# Patient Record
Sex: Female | Born: 1935 | Race: Black or African American | Hispanic: No | Smoking: Former smoker
Health system: Southern US, Community
[De-identification: ages and names within clinical notes are randomized; demographics above are authoritative.]

## PROBLEM LIST (undated history)

## (undated) DIAGNOSIS — N184 Chronic kidney disease, stage 4 (severe): Secondary | ICD-10-CM

## (undated) DIAGNOSIS — I10 Essential (primary) hypertension: Secondary | ICD-10-CM

## (undated) DIAGNOSIS — N390 Urinary tract infection, site not specified: Secondary | ICD-10-CM

## (undated) DIAGNOSIS — E785 Hyperlipidemia, unspecified: Secondary | ICD-10-CM

## (undated) DIAGNOSIS — I2510 Atherosclerotic heart disease of native coronary artery without angina pectoris: Secondary | ICD-10-CM

## (undated) DIAGNOSIS — R479 Unspecified speech disturbances: Secondary | ICD-10-CM

## (undated) DIAGNOSIS — R42 Dizziness and giddiness: Secondary | ICD-10-CM

## (undated) DIAGNOSIS — I639 Cerebral infarction, unspecified: Secondary | ICD-10-CM

## (undated) DIAGNOSIS — I428 Other cardiomyopathies: Secondary | ICD-10-CM

## (undated) DIAGNOSIS — I509 Heart failure, unspecified: Secondary | ICD-10-CM

## (undated) DIAGNOSIS — K922 Gastrointestinal hemorrhage, unspecified: Secondary | ICD-10-CM

## (undated) DIAGNOSIS — R202 Paresthesia of skin: Secondary | ICD-10-CM

## (undated) DIAGNOSIS — N289 Disorder of kidney and ureter, unspecified: Secondary | ICD-10-CM

## (undated) DIAGNOSIS — I495 Sick sinus syndrome: Secondary | ICD-10-CM

## (undated) HISTORY — DX: Chronic kidney disease, stage 4 (severe): N18.4

## (undated) HISTORY — DX: Atherosclerotic heart disease of native coronary artery without angina pectoris: I25.10

## (undated) HISTORY — DX: Hyperlipidemia, unspecified: E78.5

## (undated) HISTORY — DX: Sick sinus syndrome: I49.5

## (undated) HISTORY — PX: KNEE SURGERY: SHX244

## (undated) HISTORY — DX: Other cardiomyopathies: I42.8

## (undated) HISTORY — PX: ABDOMINAL SURGERY: SHX537

## (undated) HISTORY — DX: Unspecified speech disturbances: R47.9

---

## 1998-02-04 ENCOUNTER — Other Ambulatory Visit: Admission: RE | Admit: 1998-02-04 | Discharge: 1998-02-04 | Payer: Self-pay

## 1998-03-01 ENCOUNTER — Other Ambulatory Visit: Admission: RE | Admit: 1998-03-01 | Discharge: 1998-03-01 | Payer: Self-pay | Admitting: Family Medicine

## 1998-03-28 ENCOUNTER — Other Ambulatory Visit: Admission: RE | Admit: 1998-03-28 | Discharge: 1998-03-28 | Payer: Self-pay | Admitting: Family Medicine

## 1998-04-02 ENCOUNTER — Other Ambulatory Visit: Admission: RE | Admit: 1998-04-02 | Discharge: 1998-04-02 | Payer: Self-pay | Admitting: Family Medicine

## 1998-07-11 ENCOUNTER — Ambulatory Visit (HOSPITAL_COMMUNITY): Admission: RE | Admit: 1998-07-11 | Discharge: 1998-07-12 | Payer: Self-pay | Admitting: Ophthalmology

## 1998-12-10 ENCOUNTER — Ambulatory Visit (HOSPITAL_BASED_OUTPATIENT_CLINIC_OR_DEPARTMENT_OTHER): Admission: RE | Admit: 1998-12-10 | Discharge: 1998-12-10 | Payer: Self-pay | Admitting: Ophthalmology

## 1999-04-14 ENCOUNTER — Encounter: Payer: Self-pay | Admitting: Family Medicine

## 1999-04-14 ENCOUNTER — Ambulatory Visit (HOSPITAL_COMMUNITY): Admission: RE | Admit: 1999-04-14 | Discharge: 1999-04-14 | Payer: Self-pay | Admitting: Family Medicine

## 1999-08-28 ENCOUNTER — Other Ambulatory Visit: Admission: RE | Admit: 1999-08-28 | Discharge: 1999-08-28 | Payer: Self-pay | Admitting: Family Medicine

## 1999-11-17 ENCOUNTER — Ambulatory Visit (HOSPITAL_COMMUNITY): Admission: RE | Admit: 1999-11-17 | Discharge: 1999-11-17 | Payer: Self-pay | Admitting: Family Medicine

## 1999-11-17 ENCOUNTER — Encounter: Payer: Self-pay | Admitting: Family Medicine

## 2000-10-26 ENCOUNTER — Encounter: Payer: Self-pay | Admitting: Internal Medicine

## 2000-10-26 ENCOUNTER — Inpatient Hospital Stay (HOSPITAL_COMMUNITY): Admission: EM | Admit: 2000-10-26 | Discharge: 2000-10-27 | Payer: Self-pay | Admitting: Emergency Medicine

## 2001-05-23 ENCOUNTER — Ambulatory Visit (HOSPITAL_COMMUNITY): Admission: RE | Admit: 2001-05-23 | Discharge: 2001-05-23 | Payer: Self-pay | Admitting: Ophthalmology

## 2001-10-05 ENCOUNTER — Ambulatory Visit (HOSPITAL_COMMUNITY): Admission: RE | Admit: 2001-10-05 | Discharge: 2001-10-05 | Payer: Self-pay | Admitting: Family Medicine

## 2001-10-05 ENCOUNTER — Encounter: Payer: Self-pay | Admitting: Family Medicine

## 2002-10-13 ENCOUNTER — Encounter: Payer: Self-pay | Admitting: Family Medicine

## 2002-10-13 ENCOUNTER — Ambulatory Visit (HOSPITAL_COMMUNITY): Admission: RE | Admit: 2002-10-13 | Discharge: 2002-10-13 | Payer: Self-pay | Admitting: Family Medicine

## 2002-12-28 ENCOUNTER — Encounter: Payer: Self-pay | Admitting: Orthopedic Surgery

## 2003-01-02 ENCOUNTER — Inpatient Hospital Stay (HOSPITAL_COMMUNITY): Admission: RE | Admit: 2003-01-02 | Discharge: 2003-01-09 | Payer: Self-pay | Admitting: Orthopedic Surgery

## 2003-01-03 ENCOUNTER — Encounter: Payer: Self-pay | Admitting: Orthopedic Surgery

## 2003-01-05 ENCOUNTER — Encounter: Payer: Self-pay | Admitting: Orthopedic Surgery

## 2003-01-26 ENCOUNTER — Emergency Department (HOSPITAL_COMMUNITY): Admission: EM | Admit: 2003-01-26 | Discharge: 2003-01-26 | Payer: Self-pay | Admitting: Emergency Medicine

## 2003-02-20 ENCOUNTER — Encounter: Admission: RE | Admit: 2003-02-20 | Discharge: 2003-03-15 | Payer: Self-pay | Admitting: Orthopedic Surgery

## 2003-09-29 DIAGNOSIS — I2510 Atherosclerotic heart disease of native coronary artery without angina pectoris: Secondary | ICD-10-CM

## 2003-09-29 HISTORY — DX: Atherosclerotic heart disease of native coronary artery without angina pectoris: I25.10

## 2004-06-11 ENCOUNTER — Ambulatory Visit (HOSPITAL_COMMUNITY): Admission: RE | Admit: 2004-06-11 | Discharge: 2004-06-12 | Payer: Self-pay | Admitting: Cardiology

## 2004-06-11 HISTORY — PX: CARDIAC CATHETERIZATION: SHX172

## 2004-08-01 ENCOUNTER — Ambulatory Visit: Payer: Self-pay | Admitting: Family Medicine

## 2004-08-06 ENCOUNTER — Encounter: Admission: RE | Admit: 2004-08-06 | Discharge: 2004-08-06 | Payer: Self-pay | Admitting: Family Medicine

## 2004-08-08 ENCOUNTER — Ambulatory Visit: Payer: Self-pay | Admitting: Family Medicine

## 2004-09-08 ENCOUNTER — Ambulatory Visit: Payer: Self-pay | Admitting: Family Medicine

## 2004-11-18 ENCOUNTER — Ambulatory Visit: Payer: Self-pay | Admitting: Family Medicine

## 2004-12-23 ENCOUNTER — Ambulatory Visit: Payer: Self-pay | Admitting: Family Medicine

## 2004-12-26 ENCOUNTER — Encounter: Admission: RE | Admit: 2004-12-26 | Discharge: 2004-12-26 | Payer: Self-pay | Admitting: Family Medicine

## 2005-01-01 ENCOUNTER — Ambulatory Visit: Payer: Self-pay | Admitting: Sports Medicine

## 2005-01-15 ENCOUNTER — Ambulatory Visit (HOSPITAL_COMMUNITY): Admission: RE | Admit: 2005-01-15 | Discharge: 2005-01-15 | Payer: Self-pay | Admitting: Cardiology

## 2005-03-12 ENCOUNTER — Ambulatory Visit (HOSPITAL_COMMUNITY): Admission: RE | Admit: 2005-03-12 | Discharge: 2005-03-12 | Payer: Self-pay | Admitting: Gastroenterology

## 2005-03-19 ENCOUNTER — Ambulatory Visit: Payer: Self-pay | Admitting: Family Medicine

## 2005-03-24 ENCOUNTER — Encounter: Admission: RE | Admit: 2005-03-24 | Discharge: 2005-05-12 | Payer: Self-pay | Admitting: Orthopedic Surgery

## 2005-04-21 ENCOUNTER — Other Ambulatory Visit: Admission: RE | Admit: 2005-04-21 | Discharge: 2005-04-21 | Payer: Self-pay | Admitting: Family Medicine

## 2005-04-21 ENCOUNTER — Ambulatory Visit: Payer: Self-pay | Admitting: Family Medicine

## 2005-04-21 ENCOUNTER — Encounter: Payer: Self-pay | Admitting: Family Medicine

## 2005-05-01 ENCOUNTER — Encounter: Admission: RE | Admit: 2005-05-01 | Discharge: 2005-05-01 | Payer: Self-pay | Admitting: Family Medicine

## 2005-05-02 HISTORY — PX: OTHER SURGICAL HISTORY: SHX169

## 2005-05-03 ENCOUNTER — Encounter (INDEPENDENT_AMBULATORY_CARE_PROVIDER_SITE_OTHER): Payer: Self-pay | Admitting: *Deleted

## 2005-05-03 LAB — CONVERTED CEMR LAB

## 2005-05-19 ENCOUNTER — Ambulatory Visit: Payer: Self-pay | Admitting: Family Medicine

## 2005-06-11 ENCOUNTER — Ambulatory Visit: Payer: Self-pay | Admitting: Family Medicine

## 2005-09-15 ENCOUNTER — Ambulatory Visit: Payer: Self-pay | Admitting: Family Medicine

## 2005-10-27 ENCOUNTER — Encounter: Admission: RE | Admit: 2005-10-27 | Discharge: 2005-10-27 | Payer: Self-pay | Admitting: Vascular Surgery

## 2006-01-14 ENCOUNTER — Ambulatory Visit: Payer: Self-pay | Admitting: Family Medicine

## 2006-01-26 ENCOUNTER — Ambulatory Visit: Payer: Self-pay | Admitting: Family Medicine

## 2006-04-07 ENCOUNTER — Ambulatory Visit: Payer: Self-pay | Admitting: Family Medicine

## 2006-04-22 ENCOUNTER — Ambulatory Visit: Payer: Self-pay | Admitting: Family Medicine

## 2006-06-22 ENCOUNTER — Ambulatory Visit: Payer: Self-pay | Admitting: Family Medicine

## 2006-09-15 ENCOUNTER — Encounter (HOSPITAL_COMMUNITY): Admission: RE | Admit: 2006-09-15 | Discharge: 2006-12-14 | Payer: Self-pay | Admitting: Nephrology

## 2006-11-25 DIAGNOSIS — N184 Chronic kidney disease, stage 4 (severe): Secondary | ICD-10-CM

## 2006-11-25 DIAGNOSIS — M1990 Unspecified osteoarthritis, unspecified site: Secondary | ICD-10-CM

## 2006-11-25 DIAGNOSIS — I871 Compression of vein: Secondary | ICD-10-CM

## 2006-11-25 DIAGNOSIS — I10 Essential (primary) hypertension: Secondary | ICD-10-CM | POA: Insufficient documentation

## 2006-11-26 ENCOUNTER — Encounter (INDEPENDENT_AMBULATORY_CARE_PROVIDER_SITE_OTHER): Payer: Self-pay | Admitting: *Deleted

## 2006-11-30 ENCOUNTER — Encounter: Payer: Self-pay | Admitting: Family Medicine

## 2006-12-17 ENCOUNTER — Encounter: Payer: Self-pay | Admitting: Family Medicine

## 2006-12-23 ENCOUNTER — Encounter (HOSPITAL_COMMUNITY): Admission: RE | Admit: 2006-12-23 | Discharge: 2007-03-23 | Payer: Self-pay | Admitting: Nephrology

## 2007-01-03 ENCOUNTER — Encounter: Admission: RE | Admit: 2007-01-03 | Discharge: 2007-01-03 | Payer: Self-pay | Admitting: Nephrology

## 2007-01-13 ENCOUNTER — Ambulatory Visit: Payer: Self-pay | Admitting: Family Medicine

## 2007-01-27 ENCOUNTER — Telehealth: Payer: Self-pay | Admitting: *Deleted

## 2007-03-25 ENCOUNTER — Telehealth: Payer: Self-pay | Admitting: *Deleted

## 2007-07-04 ENCOUNTER — Ambulatory Visit: Payer: Self-pay | Admitting: Family Medicine

## 2007-07-04 LAB — CONVERTED CEMR LAB
Glucose, Urine, Semiquant: NEGATIVE
Ketones, urine, test strip: NEGATIVE
Specific Gravity, Urine: 1.01
pH: 6.5

## 2007-07-20 ENCOUNTER — Encounter: Payer: Self-pay | Admitting: Family Medicine

## 2007-08-15 ENCOUNTER — Telehealth: Payer: Self-pay | Admitting: Family Medicine

## 2007-10-20 ENCOUNTER — Encounter: Payer: Self-pay | Admitting: Family Medicine

## 2007-11-17 ENCOUNTER — Encounter: Payer: Self-pay | Admitting: Family Medicine

## 2008-02-24 ENCOUNTER — Encounter: Payer: Self-pay | Admitting: Family Medicine

## 2008-02-28 ENCOUNTER — Telehealth: Payer: Self-pay | Admitting: *Deleted

## 2008-03-01 ENCOUNTER — Ambulatory Visit: Payer: Self-pay | Admitting: Family Medicine

## 2008-04-17 ENCOUNTER — Ambulatory Visit: Payer: Self-pay | Admitting: Family Medicine

## 2008-04-17 ENCOUNTER — Inpatient Hospital Stay (HOSPITAL_COMMUNITY): Admission: AD | Admit: 2008-04-17 | Discharge: 2008-04-18 | Payer: Self-pay | Admitting: Family Medicine

## 2008-04-17 ENCOUNTER — Telehealth: Payer: Self-pay | Admitting: *Deleted

## 2008-04-17 ENCOUNTER — Encounter: Payer: Self-pay | Admitting: Family Medicine

## 2008-04-17 LAB — CONVERTED CEMR LAB: Blood Glucose, Fingerstick: 154

## 2008-04-27 ENCOUNTER — Encounter: Payer: Self-pay | Admitting: Family Medicine

## 2008-05-09 ENCOUNTER — Encounter: Payer: Self-pay | Admitting: Family Medicine

## 2008-05-09 ENCOUNTER — Ambulatory Visit: Payer: Self-pay | Admitting: Family Medicine

## 2008-05-11 ENCOUNTER — Ambulatory Visit: Payer: Self-pay | Admitting: Family Medicine

## 2008-05-14 ENCOUNTER — Encounter: Payer: Self-pay | Admitting: *Deleted

## 2008-05-14 ENCOUNTER — Ambulatory Visit: Payer: Self-pay | Admitting: Family Medicine

## 2008-05-14 DIAGNOSIS — R634 Abnormal weight loss: Secondary | ICD-10-CM

## 2008-05-14 LAB — CONVERTED CEMR LAB
ALT: 8 units/L (ref 0–35)
AST: 10 units/L (ref 0–37)
BUN: 32 mg/dL — ABNORMAL HIGH (ref 6–23)
Calcium: 9.8 mg/dL (ref 8.4–10.5)
Chloride: 105 meq/L (ref 96–112)
Creatinine, Ser: 3.18 mg/dL — ABNORMAL HIGH (ref 0.40–1.20)
HCT: 39.4 % (ref 36.0–46.0)
Platelets: 282 10*3/uL (ref 150–400)
Potassium: 4.2 meq/L (ref 3.5–5.3)
RBC: 4.63 M/uL (ref 3.87–5.11)
RDW: 14 % (ref 11.5–15.5)
Sed Rate: 59 mm/hr — ABNORMAL HIGH (ref 0–22)
Total Bilirubin: 0.4 mg/dL (ref 0.3–1.2)
Total Protein: 8.4 g/dL — ABNORMAL HIGH (ref 6.0–8.3)

## 2008-05-22 HISTORY — PX: CARDIOVASCULAR STRESS TEST: SHX262

## 2008-06-18 ENCOUNTER — Ambulatory Visit: Payer: Self-pay | Admitting: Family Medicine

## 2008-06-18 LAB — CONVERTED CEMR LAB

## 2008-07-04 ENCOUNTER — Encounter: Payer: Self-pay | Admitting: Family Medicine

## 2008-11-06 ENCOUNTER — Encounter: Payer: Self-pay | Admitting: Family Medicine

## 2008-12-12 ENCOUNTER — Telehealth: Payer: Self-pay | Admitting: Family Medicine

## 2008-12-20 ENCOUNTER — Ambulatory Visit: Payer: Self-pay | Admitting: Family Medicine

## 2008-12-20 DIAGNOSIS — R32 Unspecified urinary incontinence: Secondary | ICD-10-CM

## 2008-12-25 ENCOUNTER — Encounter: Admission: RE | Admit: 2008-12-25 | Discharge: 2008-12-25 | Payer: Self-pay | Admitting: Family Medicine

## 2009-01-31 ENCOUNTER — Ambulatory Visit: Payer: Self-pay | Admitting: Family Medicine

## 2009-01-31 LAB — CONVERTED CEMR LAB
Nitrite: NEGATIVE
Urobilinogen, UA: 0.2

## 2009-02-21 ENCOUNTER — Ambulatory Visit: Payer: Self-pay | Admitting: Family Medicine

## 2009-05-16 ENCOUNTER — Encounter: Payer: Self-pay | Admitting: Family Medicine

## 2009-08-01 ENCOUNTER — Ambulatory Visit: Payer: Self-pay | Admitting: Family Medicine

## 2009-11-11 ENCOUNTER — Ambulatory Visit: Payer: Self-pay | Admitting: Family Medicine

## 2009-11-11 DIAGNOSIS — M25519 Pain in unspecified shoulder: Secondary | ICD-10-CM

## 2009-12-03 ENCOUNTER — Encounter: Payer: Self-pay | Admitting: Family Medicine

## 2010-03-05 ENCOUNTER — Encounter: Payer: Self-pay | Admitting: Family Medicine

## 2010-05-26 ENCOUNTER — Ambulatory Visit: Payer: Self-pay | Admitting: Family Medicine

## 2010-05-28 ENCOUNTER — Encounter: Payer: Self-pay | Admitting: Family Medicine

## 2010-06-10 ENCOUNTER — Encounter: Payer: Self-pay | Admitting: *Deleted

## 2010-07-02 ENCOUNTER — Encounter: Payer: Self-pay | Admitting: Family Medicine

## 2010-07-07 ENCOUNTER — Encounter: Payer: Self-pay | Admitting: Family Medicine

## 2010-10-09 ENCOUNTER — Encounter: Payer: Self-pay | Admitting: Family Medicine

## 2010-10-19 ENCOUNTER — Encounter: Payer: Self-pay | Admitting: Cardiology

## 2010-10-28 NOTE — Consult Note (Signed)
[facility]   Imported By: Clydell Hakim 12/17/2009 14:23:52  _____________________________________________________________________  External Attachment:    Type:   Image     Comment:   External Document

## 2010-10-28 NOTE — Consult Note (Signed)
Summary: [facility]  [facility]   Imported By: De Nurse 07/02/2010 16:44:54  _____________________________________________________________________  External Attachment:    Type:   Image     Comment:   External Document

## 2010-10-28 NOTE — Miscellaneous (Signed)
  Clinical Lists Changes  Problems: Changed problem from RENAL INSUFFICIENCY, CHRONIC (ICD-586) to CHRONIC KIDNEY DISEASE STAGE IV (SEVERE) (ICD-585.4) 

## 2010-10-28 NOTE — Assessment & Plan Note (Signed)
Summary: f/u,df   Vital Signs:  Patient profile:   77 year old female Height:      60 inches Weight:      152.1 pounds BMI:     29.81 Temp:     97.9 degrees F oral Pulse rate:   84 / minute BP sitting:   137 / 69  (left arm) Cuff size:   regular  Vitals Entered By: Garen Grams LPN (May 26, 2010 3:53 PM) CC: f/u Is Patient Diabetic? No Pain Assessment Patient in pain? no        CC:  f/u.  History of Present Illness: Leg Edema complains of persistent severe leg edema.  No shortness of breath or chest pain or PND.  Not elevating (sits in rocker) taking lasix once daily not two times a day as ordered.  Compression stockings hurt too much.  Renal failure seeing Dr Melony Overly this week.    HYPERTENSION Disease Monitoring   Blood pressure range:does not check       Chest pain: N     Dyspnea:N Medications   Compliance: daily meds per mattingly   Lightheadedness: N     Edema:see above   ROS - as above PMH - Medications reviewed and updated in medication list.  Smoking Status noted in VS form     Habits & Providers  Alcohol-Tobacco-Diet     Tobacco Status: never  Current Medications (verified): 1)  Aspir-81 81 Mg Tbec (Aspirin) .... Take 1 Tablet By Mouth Once A Day 2)  Furosemide 80 Mg Tabs (Furosemide) .... 2 Each Am 3)  Toprol Xl 100 Mg Tb24 (Metoprolol Succinate) .... Take 1 Tablet By Mouth Once A Day 4)  Amlodipine Besylate 10 Mg  Tabs (Amlodipine Besylate) .... Once Daily 5)  Tramadol Hcl 50 Mg  Tabs (Tramadol Hcl) .Marland Kitchen.. 1 Two Times A Day Prn 6)  Oxybutynin Chloride 5 Mg Tabs (Oxybutynin Chloride) .... 1/2 Tablet Two Times A Day 7)  Sensipar 60 Mg Tabs (Cinacalcet Hcl) .Marland Kitchen.. 1 Daily Per Dr Briant Cedar 8)  Clonidine Hcl 0.1 Mg Tabs (Clonidine Hcl) .Marland Kitchen.. 1 By Mouth Two Times A Day  Allergies: No Known Drug Allergies  Social History: Smoking Status:  never  Physical Exam  General:  Well-developed,well-nourished,in no acute distress; alert,appropriate and  cooperative throughout examination Lungs:  Normal respiratory effort, chest expands symmetrically. Lungs are clear to auscultation, no crackles or wheezes. Heart:  Normal rate and regular rhythm. S1 and S2 normal without gallop, murmur, click, rub or other extra sounds. Extremities:  large amount of edema pitts to 2-3 mm below knee.  No skin breakdown   Impression & Recommendations:  Problem # 1:  EDEMA-LEGS,DUE TO VENOUS OBSTRUCT. (ICD-459.2) Assessment Deteriorated  most likely due to worsening renal failure and difficulty taking lasix and elevating.  Discussed approaches to these problems.   She will see Dr Briant Cedar soon for labs.   she still refuses dialysis   Orders: [facility]- Est Level  3 (16109)  Problem # 2:  HYPERTENSION, BENIGN SYSTEMIC (ICD-401.1) stable currenlty  Her updated medication list for this problem includes:    Furosemide 80 Mg Tabs (Furosemide) .Marland Kitchen... 2 each am    Toprol Xl 100 Mg Tb24 (Metoprolol succinate) .Marland Kitchen... Take 1 tablet by mouth once a day    Amlodipine Besylate 10 Mg Tabs (Amlodipine besylate) ..... Once daily    Clonidine Hcl 0.1 Mg Tabs (Clonidine hcl) .Marland Kitchen... 1 by mouth two times a day  Complete Medication List: 1)  Aspir-81 81 Mg  Tbec (Aspirin) .... Take 1 tablet by mouth once a day 2)  Furosemide 80 Mg Tabs (Furosemide) .... 2 each am 3)  Toprol Xl 100 Mg Tb24 (Metoprolol succinate) .... Take 1 tablet by mouth once a day 4)  Amlodipine Besylate 10 Mg Tabs (Amlodipine besylate) .... Once daily 5)  Tramadol Hcl 50 Mg Tabs (Tramadol hcl) .Marland Kitchen.. 1 two times a day prn 6)  Oxybutynin Chloride 5 Mg Tabs (Oxybutynin chloride) .... 1/2 tablet two times a day 7)  Sensipar 60 Mg Tabs (Cinacalcet hcl) .Marland Kitchen.. 1 daily per dr Briant Cedar 8)  Clonidine Hcl 0.1 Mg Tabs (Clonidine hcl) .Marland Kitchen.. 1 by mouth two times a day  Patient Instructions: 1)  Take all your medicines for Dr Briant Cedar to review 2)  Elevate your legs at least as high as your heart 3)  Take your furosemide  fluid pill twice a day in the early AM and middle of afternoon 4)  Get compression stockings and put them on before you get out of bed

## 2010-10-28 NOTE — Consult Note (Signed)
Summary: [facility]   [facility]   Imported By: Clydell Hakim 03/14/2010 15:31:23  _____________________________________________________________________  External Attachment:    Type:   Image     Comment:   External Document

## 2010-10-28 NOTE — Miscellaneous (Signed)
Summary: fl2 for in home care  Clinical Lists Changes form completed & to Dr. [place] to sign since pcp is out for 4 wks.Golden Circle RN  June 10, 2010 3:45 PM fl2 mailed back.Golden Circle RN  June 10, 2010 4:20 PM

## 2010-10-28 NOTE — Consult Note (Signed)
Summary: [facility]   [facility]   Imported By: Clydell Hakim 07/15/2010 15:36:25  _____________________________________________________________________  External Attachment:    Type:   Image     Comment:   External Document  Appended Document: [facility]     Clinical Lists Changes  Medications: Removed medication of AMLODIPINE BESYLATE 10 MG  TABS (AMLODIPINE BESYLATE) once daily

## 2010-10-28 NOTE — Assessment & Plan Note (Signed)
Summary: f/up,tcb   Vital Signs:  Patient profile:   77 year old female Height:      60 inches Weight:      156 pounds BMI:     30.58 Temp:     97.7 degrees F Pulse rate:   104 / minute BP sitting:   172 / 81  Vitals Entered By: Golden Circle RN (November 11, 2009 1:46 PM)  History of Present Illness: Left Shoulder Pain for last few weeks.  No trauma or weakness, worse with movement.  No change in sensation in left hand.  Has not tried pain medications.    HYPERTENSION Disease Monitoring   Blood pressure range:  Not checking     Chest pain: N     Dyspnea:N Medications   Compliance: did not take meds today worries makes her urinate   Lightheadedness: N     Edema:stable usual    ROS - as above PMH - Medications reviewed and updated in medication list.  Smoking Status noted in VS form      Current Medications (verified): 1)  Aspir-81 81 Mg Tbec (Aspirin) .... Take 1 Tablet By Mouth Once A Day 2)  Furosemide 80 Mg Tabs (Furosemide) .... 2 Each Am 3)  Toprol Xl 100 Mg Tb24 (Metoprolol Succinate) .... Take 1 Tablet By Mouth Once A Day 4)  Amlodipine Besylate 10 Mg  Tabs (Amlodipine Besylate) .... Once Daily 5)  Tramadol Hcl 50 Mg  Tabs (Tramadol Hcl) .Marland Kitchen.. 1 Two Times A Day Prn 6)  Oxybutynin Chloride 5 Mg Tabs (Oxybutynin Chloride) .... 1/2 Tablet Two Times A Day 7)  Zemplar 1 Mcg Caps (Paricalcitol) .Marland Kitchen.. 1 Daily Per Dr Briant Cedar (Renal)  Allergies: No Known Drug Allergies  Physical Exam  General:  Well-developed,well-nourished,in no acute distress; alert,appropriate and cooperative throughout examination Lungs:  Normal respiratory effort, chest expands symmetrically. Lungs are clear to auscultation, no crackles or wheezes. Heart:  Normal rate and regular rhythm. S1 and S2 normal without gallop, murmur, click, rub or other extra sounds. Msk:  Left shoulder has FROM except internal rotation which lacks about 20 degrees has pain with range of motion Distal strength and  pulses intact  No focal tenderness  Extremities:  1-2+ edema no lesions or ulcers   Impression & Recommendations:  Problem # 1:  SHOULDER PAIN (ICD-719.41)  most consistent with DJD will treat with her current analgesics and range of motion exercises.  If worsens consider xray and injection  Her updated medication list for this problem includes:    Aspir-81 81 Mg Tbec (Aspirin) .Marland Kitchen... Take 1 tablet by mouth once a day    Tramadol Hcl 50 Mg Tabs (Tramadol hcl) .Marland Kitchen... 1 two times a day prn  Orders: [facility]- Est  Level 4 (60630)  Problem # 2:  HYPERTENSION, BENIGN SYSTEMIC (ICD-401.1) Assessment: Deteriorated  Not well controlled but likely due to not taking medicciness.  Discussed which ones cause her to urinate and she should always take the others every day.  See what reading is in her nephrologist office in March visit  Her updated medication list for this problem includes:    Furosemide 80 Mg Tabs (Furosemide) .Marland Kitchen... 2 each am    Toprol Xl 100 Mg Tb24 (Metoprolol succinate) .Marland Kitchen... Take 1 tablet by mouth once a day    Amlodipine Besylate 10 Mg Tabs (Amlodipine besylate) ..... Once daily  Orders: [facility]- Est  Level 4 (16010)  Problem # 3:  WEIGHT LOSS (ICD-783.21) Continues very slow weight loss.  She assures me she had good access to food just is not hungry sometimes.  Likely due to renal insufficiency.  She does not want any screening tests   Complete Medication List: 1)  Aspir-81 81 Mg Tbec (Aspirin) .... Take 1 tablet by mouth once a day 2)  Furosemide 80 Mg Tabs (Furosemide) .... 2 each am 3)  Toprol Xl 100 Mg Tb24 (Metoprolol succinate) .... Take 1 tablet by mouth once a day 4)  Amlodipine Besylate 10 Mg Tabs (Amlodipine besylate) .... Once daily 5)  Tramadol Hcl 50 Mg Tabs (Tramadol hcl) .Marland Kitchen.. 1 two times a day prn 6)  Oxybutynin Chloride 5 Mg Tabs (Oxybutynin chloride) .... 1/2 tablet two times a day 7)  Zemplar 1 Mcg Caps (Paricalcitol) .Marland Kitchen.. 1 daily per dr Briant Cedar  (renal)  Patient Instructions: 1)  Please schedule a follow-up appointment in 6 months to check your weight.  Call if you lose your appetite 2)  Use Tylenol three times a day or Tramadol for your shoulder pain 3)  Do shoulder range of motion three ways every day 4)  Take your blood pressure medicine amlodipiine and Metropolol every day even if you go out.  They will not make you urinate more

## 2010-10-30 NOTE — Consult Note (Signed)
Summary: [facility]  [facility]   Imported By: De Nurse 10/21/2010 16:57:40  _____________________________________________________________________  External Attachment:    Type:   Image     Comment:   External Document

## 2010-12-25 ENCOUNTER — Other Ambulatory Visit: Payer: Self-pay | Admitting: Family Medicine

## 2010-12-25 DIAGNOSIS — R32 Unspecified urinary incontinence: Secondary | ICD-10-CM

## 2010-12-25 MED ORDER — OXYBUTYNIN CHLORIDE 5 MG PO TABS: 2.5000 mg | ORAL_TABLET | Freq: Two times a day (BID) | ORAL | Status: DC

## 2011-01-23 ENCOUNTER — Encounter: Payer: Self-pay | Admitting: Family Medicine

## 2011-01-27 ENCOUNTER — Other Ambulatory Visit: Payer: Self-pay | Admitting: Family Medicine

## 2011-01-27 MED ORDER — METOPROLOL SUCCINATE ER 100 MG PO TB24: 100.0000 mg | ORAL_TABLET | Freq: Every day | ORAL | Status: DC

## 2011-02-10 NOTE — Discharge Summary (Signed)
Frances Frances [name] Frances [name], Frances Frances [name] Frances [name]                 ACCOUNT NO.:  [id number]   MEDICAL RECORD NO.:  [id number]          Frances [name] TYPE:  INP   LOCATION:  3710                         FACILITY:  [facility]   PHYSICIAN:  Frances Frances [name] Frances [name], M.D.DATE OF BIRTH:  Feb 17, 1929   DATE OF ADMISSION:  04/17/2008  DATE OF DISCHARGE:  04/18/2008                               DISCHARGE SUMMARY   PRIMARY CARE PHYSICIAN:  Dr. Clelia [name] at [facility].   REASON FOR ADMISSION:  Falling x2.   DISCHARGE DIAGNOSES:  1. History of falls.  2. Chronic kidney disease.  3. Hypertension.  4. Lower extremity edema.  5. History of bilateral knee replacement.   DISCHARGE MEDICATIONS:  1. Aspirin 81 mg daily.  2. Furosemide 160 b.i.d.  3. Toprol XL 100 mg daily.  4. Amlodipine 10 mg daily.   PROCEDURES:  1. CT of Frances Frances [name] head without contrast on April 17, 2008.  Impression:      Atrophy and chronic microvascular ischemia in Frances Frances [name] white matter.  No      acute abnormality.  2. Chest x-ray on April 17, 2008.  Impression:  Cardiomegaly, improved      since prior study.  Probable scarring or pleural thickening      laterally at Frances Frances [name] left lung base, stable since prior study.  Stable      mild bronchitic change.  3. An echo on April 17, 2008.  Summary:  Mild anterior septal      hypokinesis.  Overall left ventricular systolic function was      normal.  Aortic valve thickness was mildly increased.  There was      mild to moderate aortic valvular regurgitation.  There was mild      mitral valvular regurgitation.   LABS UPON DISCHARGE:  White blood cell count 5.8, hemoglobin 10.9,  hematocrit 34.0, platelets 248,000.  Sodium 137, potassium 3.5, chloride  107, CO2 25, glucose 81, BUN 16, creatinine 2.29, calcium 9.1,  cholesterol 211, triglyceride 85, HDL cholesterol 86, LDL cholesterol  108.  Cardiac enzymes were negative x2.  TSH was 3.895.  Orthostatics  were normal.  Frances Frances [name] Frances [name] temperature was 97.6, pulse 80, respirations 18, blood  pressure 135/60, O2 sat 96% on room air.   HISTORY OF PRESENT ILLNESS:  Frances Frances [name] Frances [name] is a very pleasant 77 year old  African American female with known renal insufficiency who presented  with 2 episodes of falling at home.  she denied feeling dizzy,  lightheaded, or tripping prior to either fall.   Falls.  There were multiple etiologies to consider for this workup.  1. Heart.  Upon examination, Frances Frances [name] Frances [name] does have a systolic heart      murmur.  Frances Frances [name] Frances [name] echo results as above show mild increased aortic valve      thickness as well as mild to moderate aortic valve regurgitation      and mild mitral valve regurgitation.  This is unchanged from Frances Frances [name] Frances [name]      last echo in 2004.  Frances Frances [name] Frances [name] had a regular rate and rhythm      throughout Frances Frances [name] Frances [name] stay.  Frances Frances [name] Frances [name] cardiac enzymes  were negative x 2 and Frances Frances [name] Frances [name]      chest x-ray indicated that there was mild cardiomegaly but this was      decreased since Frances Frances [name] Frances [name] prior studies in 2005.  2. Frances Frances [name] Frances [name] has a history of CVAs per self-reported.  A CT that was      taken July 21 showed that she does have atrophy and chronic      microvascular ischemia in Frances Frances [name] Frances [name] white matter but there was no acute      abnormalities at this time.  3. Endocrine.  Frances Frances [name] Frances [name] does have a history of chronic kidney      disease that is stage 3.  She has been followed by nephrology, Dr.      Briant [name].  At this time she refuses dialysis.  Frances Frances [name] Frances [name] creatinine on      admission was 2.65 and upon discharge was 2.29.  At this time, she      was discharged with Frances Frances [name] Frances [name] home medicine of Lasix and still refuses      dialysis.  Frances Frances [name] Frances [name] TSH was within normal limits.  4. Infection.  Frances Frances [name] Frances [name] remained afebrile and without leukocytosis      throughout Frances Frances [name] Frances [name] stay.  There was no evidence of infection.  5. Debility.  Debility or deconditioning was considered Frances Frances [name] most      likely etiology of Frances Frances [name] Frances [name] falls.  Physical therapy did evaluate Ms.      Frances [name] and determined she was independent in all activities and      they signed off  immediately.  One thing that was not noted when Frances Frances [name]      Frances [name] was admitted was that she has a history of bilateral knee      replacements and that sometimes she feels that Frances Frances [name] Frances [name] legs do just      give out.  This is most likely Frances Frances [name] cause of Frances Frances [name] Frances [name] falls.  6. Drug related.  Frances Frances [name] Frances [name] admitted that she has not taken Frances Frances [name] Frances [name]      metoprolol or amlodipine in Frances Frances [name] last 24 hours and that she only      takes 160 mg of Lasix in Frances Frances [name] morning rather than b.i.d. because it      makes Frances Frances [name] Frances [name]  pee a lot.   DISCHARGE INSTRUCTIONS:  Frances Frances [name] Frances [name] was discharged with Frances Frances [name] Frances [name] home  medications and instructed to remember to take them.   FOLLOWUP APPOINTMENTS:  Frances Frances [name] Frances [name] wanted to make Frances Frances [name] Frances [name] own followup  appointment with Frances Frances [name] Frances [name] primary care physician.   DISCHARGE CONDITION:  Stable.      Frances Rima, MD  Electronically Signed      Frances Frances [name] Frances [name], M.D.  Electronically Signed    EW/MEDQ  D:  04/18/2008  T:  04/18/2008  Job:  [id number]   cc:   Frances Frances [name] Frances [name], M.D.

## 2011-02-13 NOTE — Op Note (Signed)
NAMESuraya, [name] [name] [name]                 ACCOUNT NO.:  [id number]   MEDICAL RECORD NO.:  [id number]          PATIENT TYPE:  AMB   LOCATION:  ENDO                         FACILITY:  [facility]   PHYSICIAN:  [name] [name]., M.D.DATE OF BIRTH:  11/05/1928   DATE OF PROCEDURE:  03/12/2005  DATE OF DISCHARGE:                                 OPERATIVE REPORT   PROCEDURE:  Colonoscopy.   MEDICATIONS:  The patient received a total of fentanyl 60 mcg and Versed 7  mg IV.   SCOPE:  Pediatric adjustable colonoscope.   INDICATION:  Heme-positive stool.   DESCRIPTION OF PROCEDURE:  The procedure explained to the patient and  consent obtained.  With the patient in left lateral decubitus position, the  Olympus pediatric adjustable colonoscope was inserted and advanced.  Prep  was excellent, cecum reached.  Ileocecal valve and appendiceal orifice seen.  The scope was withdrawn, and the cecum, ascending colon, transverse colon,  descending and sigmoid were seen well.  Pandiverticulosis throughout the  whole colon.  No polyps or other lesions.  Rectum free of polyps, internal  hemorrhoids seen in the rectum.  The scope was withdrawn.  The patient  tolerated the procedure well.   ASSESSMENT:  1.  Heme-positive stool with negative colonoscopy.  792.1.  2.  Pandiverticulosis.  562.10.   PLAN:  We will give a diverticulosis information sheet.  Have the patient  follow up in the office with Dr. Deirdre [name], her family physician.       JLE/MEDQ  D:  03/12/2005  T:  03/12/2005  Job:  [id number]   cc:   [name] [name], M.D.  Fax: [phone]
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
## 2011-02-13 NOTE — Op Note (Signed)
NAMELera, Gaines [name]                 ACCOUNT NO.:  [id number]   MEDICAL RECORD NO.:  [id number]          PATIENT TYPE:  AMB   LOCATION:  ENDO                         FACILITY:  [facility]   PHYSICIAN:  James L. Malon Kindle., M.D.DATE OF BIRTH:  1929-03-30   DATE OF PROCEDURE:  03/12/2005  DATE OF DISCHARGE:                                 OPERATIVE REPORT   PROCEDURE:  Esophagogastroduodenoscopy.   MEDICATIONS:  Fentanyl 60 mcg, Versed 6 mg IV.   SECOND ASSISTANT:  Olympus upper endoscope.   INDICATIONS FOR PROCEDURE:  Heme positive stool.   DESCRIPTION OF PROCEDURE:  The procedure explained to the patient and  consent obtained. With the patient in the left lateral decubitus position,  the Olympus upper endoscope was inserted and advanced. The stomach was  entered, pylorus identified and passed. The duodenum including the bulb and  second portion were seen well and were unremarkable. The scope withdrawn,  antrum, body, fundus of the stomach all seen well and were normal. There was  a large 3-4 cm hiatal hernia with a widely patent GE junction, several  erosions of the distal esophagus and a reddened distal esophagus. The  proximal esophagus was normal. The scope was withdrawn. The patient  tolerated the procedure well and was maintained on low flow oxygen and pulse  oximetry throughout the procedure.   ASSESSMENT:  1.  Heme positive stool, 792.1.  2.  Erosive esophagitis, 530.11.   PLAN:  Will give the patient generic Prilosec, a reflux sheet, proceed with  colonoscopy at this time.       JLE/MEDQ  D:  03/12/2005  T:  03/12/2005  Job:  [id number]   cc:   Pearlean Brownie, M.D.  Fax: [phone]

## 2011-02-13 NOTE — Discharge Summary (Signed)
Frances [name], Frances [name]                             ACCOUNT NO.:  [id number]   MEDICAL RECORD NO.:  [id number]                   PATIENT TYPE:  INP   LOCATION:  5038                                 FACILITY:  [facility]   PHYSICIAN:  Robert A. Thurston Hole, M.D.              DATE OF BIRTH:  09-10-1929   DATE OF ADMISSION:  01/02/2003  DATE OF DISCHARGE:  01/09/2003                                 DISCHARGE SUMMARY   ADMITTING DIAGNOSES:  1. End-stage degenerative joint disease, left knee.  2. History of cervical cancer.  3. History of a cerebrovascular accident.  4. Hypertension.  5. Congestive heart failure with chronic lower extremity edema.   DISCHARGE DIAGNOSES:  1. End-stage degenerative joint disease, left knee.  2. History of cervical cancer.  3. Hypertension.  4. Congestive heart failure with chronic lower extremity edema.  5. History of cerebrovascular accident.  6. Urinary tract infection.  7. Atelectasis.  8. Obesity.   HISTORY OF PRESENT ILLNESS:  The patient is a 77 year old female with a  history of bone-on-bone osteoarthritis of her right knee.  She has failed  conservative treatment including anti-inflammatories and cortisone  injections at this point in time.  She understands the risks, benefits and  possible complications of a left total knee and is without question.   PROCEDURES IN HOUSE:  The patient underwent a left total knee replacement by  Dr. Elana Alm. Wainer on January 02, 2003 and a femoral nerve block by  anesthesia on the same day.   HOSPITAL COURSE:  [facility] hospitalist was consulted to manage her multiple  medical problems.  Postop day 1, the patient was doing well, no complaints,  pulse was 120.  She was slightly tachycardic, no shortness of breath, no  chest pain.  O2 saturations were good at 97% on 2 L.  Surgical wound was  well-approximated.  Her hemoglobin was 9.6.  She was metabolically stable.  She was taking Percocet for pain, her PCA was discontinued,  her drain was  removed.  She was started on Lovenox 30 mg subcu twice a day.  She received  a 2-D echo.  Postop day 2, the patient continued to progress.  Her CPM was 0  to 40.  Surgical wound was well-approximated.  Her hemoglobin was 7.7.  Her  renal function was depressed due to postop blood loss anemia and  dehydration; she was given 2 units of blood with 10 mg of Lasix between  units.  Orders were written to transfer to 5000.  Postop day 3, the serum  creatinine was 2.6, BUN was 28, T-max was 101, hemoglobin was 9.4, UA showed  moderate leukocytes; she was started on Cipro 250 mg b.i.d. for three days  for her UTI.  Postop day 4, T-max of 100.8, blood pressure 150/59, surgical  wound well-approximated.  Postop day 6, T-max of 101, hemoglobin was 9.0,  INR was  2.1, surgical wound was well-approximated.  Discharge planning was  started.  Postop day 7, patient was doing well with no complaints.  She was  afebrile x24 hours.  Her surgical wound was well-approximated.  She was  discharged to home in stable condition, weightbearing as tolerated, on a  regular diabetic diet with:    DISCHARGE MEDICATIONS:  1. Percocet one to two every four to six hours p.r.n. pain,  2. Coumadin 4 mg one tablet daily.  3. Lasix 40 mg one tablet every morning.  4. Zocor 20 mg one tablet twice a day.  5. Colace 100 mg twice a day.  6. Senokot two tablets twice a day before a meal.   SPECIAL DISCHARGE INSTRUCTIONS:  She was instructed to call with  temperatures greater than 101, increased redness, increased drainage.  She  has been instructed to keep her wound clean and dry.   FOLLOWUP:  She will follow up with [place] on January 16, 2003.     Kirstin Shepperson, P.A.                  Robert A. Thurston Hole, M.D.    KS/MEDQ  D:  02/21/2003  T:  02/22/2003  Job:  [id number]

## 2011-02-13 NOTE — Op Note (Signed)
[place]  Patient:    SOWMYA, PARTRIDGE Visit Number: [id number] MRN: [id number]          Service Type: DSU Location: [facility] 2860 01 Attending Physician:  Karenann Cai Proc. Date: 05/24/01 Adm. Date:  05/23/2001 Disc. Date: 05/23/2001                             Operative Report  PREOPERATIVE DIAGNOSIS:  Opaque posterior capsule, left eye.  POSTOPERATIVE DIAGNOSIS:  Opaque posterior capsule, left eye.  OPERATION PERFORMED:  YAG laser capsulotomy.  SURGEON:  Marya Landry. Carlyle Lipa., M.D.  ANESTHESIA:  INDICATIONS FOR PROCEDURE:  The patient is a 77 year old lady who underwent cataract extraction of the left eye several months ago but now has a dense opaque posterior capsule of the left eye.  Her visual acuity ____________ 20/400 in that eye.  YAG laser capsulotomy was recommended and she is admitted at this time for that purposed.  DESCRIPTION OF PROCEDURE:  The patient was brought to the YAG laser room and positioned appropriately behind the laser.  Approximately 30 applications of laser energy of 2 millijoules were applied to the posterior capsule obtaining an opening of 3 to 4 mm.  The patient tolerated the procedure well and was discharged to the post anesthesia care unit in satisfactory condition.  She was instructed to see me in the office tomorrow morning for further evaluation.  DISCHARGE DIAGNOSES:  Opaque posterior capsule, left eye. Attending Physician:  Karenann Cai DD:  05/24/01 TD:  05/24/01 Job: [id number]

## 2011-02-13 NOTE — Cardiovascular Report (Signed)
NAMEAVALEEN, BROWNLEY                             ACCOUNT NO.:  [id number]   MEDICAL RECORD NO.:  [id number]                   PATIENT TYPE:  OIB   LOCATION:  2899                                 FACILITY:  [facility]   PHYSICIAN:  Madaline Savage, M.D.             DATE OF BIRTH:  1929/06/01   DATE OF PROCEDURE:  06/11/2004  DATE OF DISCHARGE:                              CARDIAC CATHETERIZATION   PROCEDURES PERFORMED:  1.  Retrograde left heart catheterization.  2.  Coronary angiography.   There was no left ventricular angiography performed during this procedure.   DYE USED:  Visipaque 40 mL.   COMPLICATIONS:  None.   PATIENT PROFILE:  Ms. Osso is a 77 year old African-American woman with a  longstanding history of hypertension who has had suboptimal control over the  years.  She was seen several months ago with complaints of left arm pain and  part of the workup for that arm pain included a Cardiolite stress test which  was performed December 25, 2003 showing a left ventricular ejection fraction of  44% and there was noted to be ischemia in the right coronary artery  territory.  The patient was therefore scheduled for this cardiac  catheterization today.  The test was completed with 40 mL of Visipaque  contrast with no complications.  The patient received Mucomyst and also got  a bicarbonate drip one hour before the procedure and will continue that one  for six hours following the procedure.   RESULTS:   PRESSURES:  Left ventricular pressure was 145/14, end-diastolic pressure 18.  Central aortic pressure 145/60, mean of 95.  No aortic valve gradient by  pullback technique.   ANGIOGRAPHIC RESULTS:  1.  The left main coronary artery was normal.  2.  The LAD coursed to the cardiac apex and was normal.  The first diagonal      branch off of the proximal LAD bifurcated distally and contained no      lesions.  3.  The circumflex was a large and dominant vessel giving rise to both   posterior lateral and posterior descending branch distally and first OM      and second OM branches were normal.   Right coronary angiography showed a small nondominant right with no  significant lesions seen.  There was no left ventricular angiogram performed  due to the patient's baseline creatinine of 2.5.   FINAL DIAGNOSES:  1.  Angiographically patent coronary arteries with a left dominant system.  2.  Normal left ventricular end-diastolic pressure.   PLAN:  The patient will be continued on antihypertensive medication.  She  will receive intravenous bicarbonate drip for another six hours and continue  on her doses of Mucomyst.  She will be seen in 48 hours for a basic  metabolic profile and will follow up in our office.  Madaline Savage, M.D.    WHG/MEDQ  D:  06/11/2004  T:  06/11/2004  Job:  [id number]   cc:   [facility]

## 2011-02-13 NOTE — Op Note (Signed)
NAMEALLIYAH, Frances [name]                             ACCOUNT NO.:  [id number]   MEDICAL RECORD NO.:  [id number]                   PATIENT TYPE:  INP   LOCATION:  3715                                 FACILITY:  [facility]   PHYSICIAN:  Robert A. Thurston Hole, M.D.              DATE OF BIRTH:  23-Mar-1929   DATE OF PROCEDURE:  01/02/2003  DATE OF DISCHARGE:                                 OPERATIVE REPORT   PREOPERATIVE DIAGNOSIS:  Left knee degenerative joint disease.   POSTOPERATIVE DIAGNOSIS:  Left knee degenerative joint disease.   PROCEDURES:  1. Left total knee replacement using [employer] Scorpio total knee system     with #7 cemented femoral component, #7 cemented tibial component with 12     mm polyethylene flexed tibial spacer, 26 mm polyethylene cemented     patella.  2. Left unilateral retinacular release.   SURGEON:  Elana Alm. Thurston Hole, M.D.   ASSISTANT:  Julien Girt, P.A.   ANESTHESIA:  Spinal.   OPERATIVE TIME:  One hour and 40 minutes.   COMPLICATIONS:  None.   DESCRIPTION OF PROCEDURE:  The patient was brought to the operating room on  January 02, 2003, placed on the operating room table in the supine position.  After adequate level of spinal anesthesia was obtained by Dr. Jacklynn Bue of  anesthesia, she had a Foley catheter placed under sterile conditions and  received Ancef 1 g IV preoperatively for prophylaxis.  The left knee was  examined under anesthesia with range of motion from -10 to 115 degrees, mild  varus deformity of knee.  Stable ligamentous exam with normal patellar  tracking.  Left leg was prepped using sterile Duraprep and draped using  sterile technique.  The leg was exsanguinated and a tourniquet elevated 375  mm.  Initially, through a 15-20 cm longitudinal incision based along the  patella, initial exposure was made.  The underlying subcutaneous tissues  were incised along with skin incision.  A median arthrotomy was performed  revealing excessive amount of  normal appearing joint fluid.  The articular  surfaces were inspected.  She had grade 4 changes throughout the knee in  medial/lateral and patellar femoral.  Osteophytes were removed from the  femoral condyles and tibial plateau.  Medial/lateral meniscal remnants were  removed as well as the anterior cruciate ligament.  The intermedullary drill  was then drilled up the femoral canal for placement of the distal femoral  cutting jig which was placed in the appropriate amount of rotation and a  distal 12 mm cut was made.  The distal femur was the sized.  A #7 was found  to be the appropriate size and a #7 cutting jig was placed and these cuts  were made.  The proximal tibia was then exposed.  The tibial spines were  removed with an oscillating saw.  Intermedullary drill drilled down the  tibial canal for placement  of the proximal tibial cutting jig which is  placed in the appropriate amount of rotation and a 6 mm proximal tibial cut  was made based off the medial or lower side.  After this was done, the  Scorpio PCL cutter was placed back on the distal femur and these cuts were  made.  At this point then the #7 femoral trial was placed with a #7 tibial  base plate trial was placed and with a 12 mm polyethylene spacer there was  found to be excellent restoration of normal alignment.  Range of motion was  0-120 degrees with excellent stability.  The tibial base plate was then  marked for rotation and the keel cut was made.  At this point, the patella  was size.  A 26 mm was found to be the appropriate size and a recessed 10 mm  x 26 mm cut was made and three locking holes were placed.  At this point, it  was felt that all the components were of excellent size, fit and stability  on the trial components and these were then removed.  The knee was then jet  lavaged irrigated with 3 L of saline solution.  The proximal tibia was then  exposed and the #7 tibial base plate with cement backing was  hammered into  position with an excellent fit with excess cement being removed from around  the edges.  In process of doing this, there was a partial detachment of the  patellar tendon distally and this was repaired with #2 Ethibond suture over  two small drill holes in the tibial tubercle.  Only 50% had been detached  and the other 50% remained intact.  This repair was felt to be solid.  After  the excess cement around the tibial base plate was removed, the femoral  component #7 with cement backing was hammered into position also with an  excellent fit with excess cement being removed from around the edges.  The  12 mm polyethylene flexed tibial spacer was locked on the tibial base plate.  The knee was taken through a range of motion with 0-120 degrees with  excellent stability.  The 26 mm cement backed patella was then placed in the  recess hole and locked into position with a clamp.  After cement hardened,  patella femoral tracking was evaluated.  There was still some significant  lateral tracking and thus lateral retinacular release was carried out  improving patellar tracking to normal.  After this was done, it was felt  that all the components were of excellent size, fit and stability.  The knee  was irrigated with antibiotic solution and then the arthrotomy was closed  with #1 Ethibond suture of two medium Hemovac drains.  Subcutaneous tissues  were closed with 0 Vicryl and 2-0 Vicryl.  The skin was closed with skin  staples.  Sterile dressings were applied.  Hemovac injected with 0.25%  Marcaine with epinephrine and clamped.  The tourniquet was released.  The  patient was then taking to the recovery room in stable condition.  Needle  and sponge counts correct x2 at the end of the case.                                               Robert A. Thurston Hole, M.D.    RAW/MEDQ  D:  01/02/2003  T:  01/03/2003  Job:  [id number]

## 2011-02-13 NOTE — Discharge Summary (Signed)
[place]  Patient:    Frances [name], Frances [name]                          MRN: [id number] Adm. Date:  [id number] Disc. Date: [id number] Attending:  Enrique Sack CC:         [facility]   Discharge Summary  PROBLEMS: 1. Hyperkalemia. 2. High blood pressure. 3. Renal insufficiency.  SUBJECTIVE:  This is a 77 year old patient who was admitted at 2 a.m. on October 26, 2000 by Dr. Erskine Speed as an unassigned patient to [facility] Emergency Room.  She goes to [facility] for her regular medical care.  The patient was admitted to the hospital with a potassium of 6.9, renal insufficiency, and a history of high blood pressure.  It was felt that her admitting problems were related to her medications which included Tenormin, Maxzide, Altace, and Vioxx.  HOSPITAL COURSE:  The patient was totally asymptomatic and was called at home by the laboratory after her critical potassium was found.  She then came to the emergency room and was evaluated and admitted.  Her medications was stopped and the patient was given gentle rehydration.  Within the first 12 hours the patients potassium improved.  She had a 4.6 second pause and her beta blocker was discontinued.  By 4 p.m. on October 26, 2000 BUN was down to 31, creatinine was down from 2 to 1.8, and potassium was down to 5.4.  The patient had also been given Kayexalate.  An ultrasound of the kidneys was performed showing no evidence of hydronephrosis or mass.  The patient continued to fell well throughout the entire hospitalization.  By October 27, 2000 the patient had no further pauses, although she had some brief slowing episodes, none were clinically significant.  Her CMET was entirely within normal limits with a sodium of 138, potassium 4.7, chloride 115, glucose 84, BUN 23, creatinine 1.5, normal liver function enzymes.  Without further delay the patient was then discharged on no medication of any type.  A  note was sent to [facility] with the patient that if perhaps she did have blood pressure elevation problems coming back then maybe a medicine like Norvasc which would not interfere with conduction or kidney function might be appropriate.  The patient was discharged in improved condition.  CONSULTATIONS:  None.  COMPLICATIONS:  None.  PROCEDURE:  None.  DISCHARGE MEDICATIONS:  None.  She could take some Tylenol if she wanted to for arthritis.  FOLLOW-UP:  She is to see the doctors of [facility] on Friday, February 1. DD:  10/27/00 TD:  10/27/00 Job: [id number]

## 2011-02-16 ENCOUNTER — Encounter: Payer: Self-pay | Admitting: Family Medicine

## 2011-03-04 ENCOUNTER — Other Ambulatory Visit: Payer: Self-pay | Admitting: Family Medicine

## 2011-03-04 MED ORDER — ASPIRIN 81 MG PO TABS: 81.0000 mg | ORAL_TABLET | Freq: Every day | ORAL | Status: DC

## 2011-03-06 ENCOUNTER — Encounter: Payer: Self-pay | Admitting: Family Medicine

## 2011-03-26 ENCOUNTER — Emergency Department (HOSPITAL_COMMUNITY): Payer: Medicare Other

## 2011-03-26 ENCOUNTER — Encounter: Payer: Self-pay | Admitting: Family Medicine

## 2011-03-26 ENCOUNTER — Inpatient Hospital Stay (HOSPITAL_COMMUNITY)
Admission: EM | Admit: 2011-03-26 | Discharge: 2011-03-31 | DRG: 871 | Disposition: A | Discharge status: Home Health | Payer: Medicare Other | Attending: Family Medicine | Admitting: Family Medicine

## 2011-03-26 DIAGNOSIS — K8020 Calculus of gallbladder without cholecystitis without obstruction: Secondary | ICD-10-CM | POA: Diagnosis present

## 2011-03-26 DIAGNOSIS — A419 Sepsis, unspecified organism: Secondary | ICD-10-CM | POA: Diagnosis present

## 2011-03-26 DIAGNOSIS — J9600 Acute respiratory failure, unspecified whether with hypoxia or hypercapnia: Secondary | ICD-10-CM | POA: Diagnosis present

## 2011-03-26 DIAGNOSIS — I5040 Unspecified combined systolic (congestive) and diastolic (congestive) heart failure: Secondary | ICD-10-CM | POA: Diagnosis present

## 2011-03-26 DIAGNOSIS — N184 Chronic kidney disease, stage 4 (severe): Secondary | ICD-10-CM | POA: Diagnosis present

## 2011-03-26 DIAGNOSIS — Z7982 Long term (current) use of aspirin: Secondary | ICD-10-CM

## 2011-03-26 DIAGNOSIS — I359 Nonrheumatic aortic valve disorder, unspecified: Secondary | ICD-10-CM | POA: Diagnosis present

## 2011-03-26 DIAGNOSIS — Z96659 Presence of unspecified artificial knee joint: Secondary | ICD-10-CM

## 2011-03-26 DIAGNOSIS — M1990 Unspecified osteoarthritis, unspecified site: Secondary | ICD-10-CM | POA: Diagnosis present

## 2011-03-26 DIAGNOSIS — R32 Unspecified urinary incontinence: Secondary | ICD-10-CM | POA: Diagnosis present

## 2011-03-26 DIAGNOSIS — R634 Abnormal weight loss: Secondary | ICD-10-CM | POA: Diagnosis present

## 2011-03-26 DIAGNOSIS — A4150 Gram-negative sepsis, unspecified: Principal | ICD-10-CM | POA: Diagnosis present

## 2011-03-26 DIAGNOSIS — K7200 Acute and subacute hepatic failure without coma: Secondary | ICD-10-CM | POA: Diagnosis not present

## 2011-03-26 DIAGNOSIS — D649 Anemia, unspecified: Secondary | ICD-10-CM | POA: Diagnosis present

## 2011-03-26 DIAGNOSIS — Z66 Do not resuscitate: Secondary | ICD-10-CM | POA: Diagnosis present

## 2011-03-26 DIAGNOSIS — I509 Heart failure, unspecified: Secondary | ICD-10-CM | POA: Diagnosis present

## 2011-03-26 DIAGNOSIS — J189 Pneumonia, unspecified organism: Secondary | ICD-10-CM | POA: Diagnosis present

## 2011-03-26 DIAGNOSIS — M25519 Pain in unspecified shoulder: Secondary | ICD-10-CM | POA: Diagnosis present

## 2011-03-26 DIAGNOSIS — Z8541 Personal history of malignant neoplasm of cervix uteri: Secondary | ICD-10-CM

## 2011-03-26 DIAGNOSIS — I129 Hypertensive chronic kidney disease with stage 1 through stage 4 chronic kidney disease, or unspecified chronic kidney disease: Secondary | ICD-10-CM | POA: Diagnosis present

## 2011-03-26 DIAGNOSIS — N179 Acute kidney failure, unspecified: Secondary | ICD-10-CM | POA: Diagnosis not present

## 2011-03-26 DIAGNOSIS — E876 Hypokalemia: Secondary | ICD-10-CM | POA: Diagnosis not present

## 2011-03-26 LAB — URINE MICROSCOPIC-ADD ON

## 2011-03-26 LAB — POCT I-STAT, CHEM 8
Creatinine, Ser: 3.5 mg/dL — ABNORMAL HIGH (ref 0.50–1.10)
HCT: 40 % (ref 36.0–46.0)
Hemoglobin: 13.6 g/dL (ref 12.0–15.0)
Potassium: 3.3 mEq/L — ABNORMAL LOW (ref 3.5–5.1)
Sodium: 141 mEq/L (ref 135–145)
TCO2: 21 mmol/L (ref 0–100)

## 2011-03-26 LAB — TROPONIN I: Troponin I: <0.3 ng/mL (ref <0.30)

## 2011-03-26 LAB — URINALYSIS, ROUTINE W REFLEX MICROSCOPIC
Bilirubin Urine: NEGATIVE
Glucose, UA: NEGATIVE mg/dL
Specific Gravity, Urine: 1.01 (ref 1.005–1.030)
pH: 7 (ref 5.0–8.0)

## 2011-03-26 LAB — DIFFERENTIAL
Eosinophils Absolute: 0 10*3/uL (ref 0.0–0.7)
Eosinophils Relative: 0 % (ref 0–5)
Lymphs Abs: 0.6 10*3/uL — ABNORMAL LOW (ref 0.7–4.0)
Monocytes Absolute: 0.6 10*3/uL (ref 0.1–1.0)
Monocytes Relative: 5 % (ref 3–12)

## 2011-03-26 LAB — CK TOTAL AND CKMB (NOT AT ARMC)
CK, MB: 1.5 ng/mL (ref 0.3–4.0)
Relative Index: INVALID (ref 0.0–2.5)
Total CK: 76 U/L (ref 7–177)
Total CK: 79 U/L (ref 7–177)

## 2011-03-26 LAB — PROTIME-INR: Prothrombin Time: 15.7 seconds — ABNORMAL HIGH (ref 11.6–15.2)

## 2011-03-26 LAB — PROCALCITONIN: Procalcitonin: 55.37 ng/mL

## 2011-03-26 LAB — LACTIC ACID, PLASMA: Lactic Acid, Venous: 4.3 mmol/L — ABNORMAL HIGH (ref 0.5–2.2)

## 2011-03-26 LAB — CBC
Hemoglobin: 11.8 g/dL — ABNORMAL LOW (ref 12.0–15.0)
RBC: 4.4 MIL/uL (ref 3.87–5.11)
WBC: 11.4 10*3/uL — ABNORMAL HIGH (ref 4.0–10.5)

## 2011-03-26 MED ORDER — TECHNETIUM TO 99M ALBUMIN AGGREGATED
6.0000 | Freq: Once | INTRAVENOUS | Status: AC | PRN
  Administered 2011-03-26: 6.2 via INTRAVENOUS

## 2011-03-26 MED ORDER — XENON XE 133 GAS
10.0000 | GAS_FOR_INHALATION | Freq: Once | RESPIRATORY_TRACT | Status: AC | PRN
  Administered 2011-03-26: 10 via RESPIRATORY_TRACT

## 2011-03-26 NOTE — H&P (Signed)
[facility] Admission History and Physical  Patient name: Frances [name] Medical record number: [id number] Date of birth: Oct 18, 1928 Age: 77 y.o. Gender: female  Primary Care Provider: Carney Living, MD, MD  Chief Complaint: Shortness of breath.  History of Present Illness: Frances [name] is a 77 y.o. year old female presenting with Shortness of breath and weakness. Per her home health worker she has been less active and having worsening shortness of breath for the past week. She had been refusing to go the hospital. However yesterday she SOB worsened and she developed nausea and weakness. Her home health worker convinced Frances [name] to come to [facility] ED for futher workup and management. Upon arrival she was tachycardic, hypotensive, and tachypnic. The blood pressure, and the majority of the tachycardia responded well to a fluid bolus. Her tachypnia responded some what to 3L O2 and she is satting well. She denies any fever, chills, abdominal pain, dysurea, continued nausea. She is hungry and thirsty and would like to eat. She feels pretty well currently. She continues to deny dialysis and does want DNR/I status.   Patient Active Problem List  Diagnoses  . HYPERTENSION, BENIGN SYSTEMIC  . EDEMA-LEGS,DUE TO VENOUS OBSTRUCT.  Marland Kitchen CHRONIC KIDNEY DISEASE STAGE IV (SEVERE)  . OSTEOARTHRITIS, LOWER LEG  . SHOULDER PAIN  . WEIGHT LOSS  . URINARY INCONTINENCE   Past Medical History: Past Medical History  Diagnosis Date  . CAD (coronary artery disease) 2005    Last cath in 2005 - no significant disease  . Normal echocardiogram 2009    mild findings EJ fx 55   2. Chronic kidney disease. 4  3. Hypertension.   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  Social History: Lives alone and has home health worker Frances [name] to help  her. Her number is [phone]. Frances [name] has one son in prison in [place]. No HCPOA.  Widowed.   Family History: No family history on file.  Allergies: Allergies not on file  Current Outpatient Prescriptions  Medication Sig Dispense Refill  . aspirin 81 MG tablet Take 1 tablet (81 mg total) by mouth daily.  90 tablet  3  . cinacalcet (SENSIPAR) 60 MG tablet Take 60 mg by mouth daily. per Dr Frances [name]       . cloNIDine (CATAPRES) 0.1 MG tablet Take 0.1 mg by mouth 2 (two) times daily.        . furosemide (LASIX) 80 MG tablet Take 160 mg by mouth every morning.        . metoprolol (TOPROL-XL) 100 MG 24 hr tablet Take 1 tablet (100 mg total) by mouth daily.  31 tablet  11  . oxybutynin (DITROPAN) 5 MG tablet Take 0.5 tablets (2.5 mg total) by mouth 2 (two) times daily.  30 tablet  3  . traMADol (ULTRAM) 50 MG tablet Take 50 mg by mouth 2 (two) times daily as needed.         Review Of Systems: Per HPI  Otherwise 12 point review of systems was performed and was unremarkable.  Physical Exam: Pulse: 110  Blood Pressure: 118/50 RR: 26   O2: 98 on 3L Temp: 97.7  General: alert and cooperative HEENT: PERRLA and No teeth. Tacky mucous membranes Heart: S1 and S2 normal, tachy normal rhythm. Systolic murmur radiating to the carrotids present.  Lungs: Tachypnea, no increased WOB. Crackles on right base. No wheeze noted.  Abdomen: abdomen is soft without significant tenderness, masses, organomegaly or guarding Extremities: extremities normal, atraumatic, no cyanosis or edema Skin:no rashes Neurology: normal without focal findings, mental status, speech normal, alert and oriented x3, PERLA and reflexes normal and symmetric  Labs and Imaging: Lab Results  Component Value Date/Time   NA 141 03/26/2011  2:22 PM   K 3.3* 03/26/2011  2:22 PM   CL 109 03/26/2011  2:22 PM   CO2 19 05/14/2008  8:55 PM   BUN 38* 03/26/2011  2:22 PM   CREATININE 3.50* 03/26/2011  2:22 PM   GLUCOSE 138* 03/26/2011  2:22 PM   Lab  Results  Component Value Date   WBC 11.4* 03/26/2011   HGB 13.6 03/26/2011   HCT 40.0 03/26/2011   MCV 83.6 03/26/2011   PLT 194 03/26/2011   Lab Results  Component Value Date   ALT 8 05/14/2008   AST 10 05/14/2008   ALKPHOS 183* 05/14/2008   BILITOT 0.4 05/14/2008   Lab Results  Component Value Date   CKTOTAL 76 03/26/2011   TROPONINI <0.30 03/26/2011   Procalcitonin 55, Lactate 4.3 BNP: 3417  CXR:  Mild vascular congestion and bibasilar atelectasis.  UA: Small LE, and 0-2 WBCs.   EKG: Rate 123 Sinus, ST depression  v5 and inverted T waves in V4-6. Changed from prior study.    Assessment and Plan: Frances [name] is a 77 y.o. year old female presenting with tachycardia, and tachypnea and hypotension 1. Tachycardia and Hypotension:  Both responded moderately to a small fluid bolus. Her EKG does show some T wave inversions and 1 lead with depression. However her cardiac enzymes are negative. My DDX includes: SIRS due to pneumonia, PE, MI.  Plan: Getting V/Q scan to eval for PE, treating with ceftriaxone and azithromycin for presumed PNA, and cycling CE for MI workup. Observing in [place] on monitor for arrythmias.  Will avoid too much fluid resuscitation to avoid volume overload. Holding lasix for now, as well as clonidine and amlodipine.  Will likely be restarting those medications in the morning.  2. ID: Suspect SIRS, however CXR is not positive for PNA or volume overload at this time. I think that her crackles on exam will likely become a PNA tomorrow when we repeat the CXR in the AM. Urine and BCX obtained. Will treat with Ceftriaxone and Azithromycin and follow in the morning. If another diagnosis becomes more apparent will d/c ABX. Will follow.  3. CKD IV: Has refused dialysis in the past and again refuses it today. Will follow creatine tomorrow. Will consult renal if needed.  4. HTN: Provided metoprolol at this time but holding clonidine patch and amlodipine. Will follow.  5.  FEN/GI: Passed bedside swallow study. Will allow a normal diet.  6 Prophylaxis: Heparin 7 Disposition: When PT/OT eval complete.   [id number]

## 2011-03-27 ENCOUNTER — Inpatient Hospital Stay (HOSPITAL_COMMUNITY): Payer: Medicare Other

## 2011-03-27 DIAGNOSIS — R0602 Shortness of breath: Secondary | ICD-10-CM

## 2011-03-27 DIAGNOSIS — I359 Nonrheumatic aortic valve disorder, unspecified: Secondary | ICD-10-CM

## 2011-03-27 DIAGNOSIS — A419 Sepsis, unspecified organism: Secondary | ICD-10-CM

## 2011-03-27 LAB — URINE CULTURE
Colony Count: NO GROWTH
Culture  Setup Time: 201206282158
Culture: NO GROWTH

## 2011-03-27 LAB — COMPREHENSIVE METABOLIC PANEL
ALT: 491 U/L — ABNORMAL HIGH (ref 0–35)
AST: 308 U/L — ABNORMAL HIGH (ref 0–37)
Albumin: 2.8 g/dL — ABNORMAL LOW (ref 3.5–5.2)
Calcium: 8.3 mg/dL — ABNORMAL LOW (ref 8.4–10.5)
Chloride: 100 mEq/L (ref 96–112)
Creatinine, Ser: 3.89 mg/dL — ABNORMAL HIGH (ref 0.50–1.10)
Sodium: 138 mEq/L (ref 135–145)

## 2011-03-27 LAB — CBC
Hemoglobin: 10.3 g/dL — ABNORMAL LOW (ref 12.0–15.0)
MCH: 27 pg (ref 26.0–34.0)
MCV: 82.7 fL (ref 78.0–100.0)
Platelets: 162 10*3/uL (ref 150–400)
RBC: 3.82 MIL/uL — ABNORMAL LOW (ref 3.87–5.11)
WBC: 18.4 10*3/uL — ABNORMAL HIGH (ref 4.0–10.5)

## 2011-03-27 LAB — CARDIAC PANEL(CRET KIN+CKTOT+MB+TROPI)
Relative Index: 3.5 — ABNORMAL HIGH (ref 0.0–2.5)
Total CK: 150 U/L (ref 7–177)
Troponin I: <0.3 ng/mL (ref <0.30)

## 2011-03-28 LAB — COMPREHENSIVE METABOLIC PANEL
ALT: 274 U/L — ABNORMAL HIGH (ref 0–35)
AST: 77 U/L — ABNORMAL HIGH (ref 0–37)
Alkaline Phosphatase: 221 U/L — ABNORMAL HIGH (ref 39–117)
CO2: 21 mEq/L (ref 19–32)
Chloride: 97 mEq/L (ref 96–112)
Creatinine, Ser: 4.81 mg/dL — ABNORMAL HIGH (ref 0.50–1.10)
GFR calc non Af Amer: 9 mL/min — ABNORMAL LOW (ref >60)
Total Bilirubin: 2.3 mg/dL — ABNORMAL HIGH (ref 0.3–1.2)

## 2011-03-28 LAB — CBC
MCV: 81 fL (ref 78.0–100.0)
Platelets: 142 10*3/uL — ABNORMAL LOW (ref 150–400)
RBC: 3.69 MIL/uL — ABNORMAL LOW (ref 3.87–5.11)
WBC: 12.7 10*3/uL — ABNORMAL HIGH (ref 4.0–10.5)

## 2011-03-28 LAB — CORTISOL-AM, BLOOD: Cortisol - AM: 22.6 ug/dL — ABNORMAL HIGH (ref 4.3–22.4)

## 2011-03-29 LAB — CULTURE, BLOOD (ROUTINE X 2): Culture  Setup Time: 201206282058

## 2011-03-29 LAB — CBC
HCT: 28.4 % — ABNORMAL LOW (ref 36.0–46.0)
MCH: 27 pg (ref 26.0–34.0)
MCHC: 33.8 g/dL (ref 30.0–36.0)
MCV: 79.8 fL (ref 78.0–100.0)
RDW: 13.6 % (ref 11.5–15.5)

## 2011-03-29 LAB — COMPREHENSIVE METABOLIC PANEL
Albumin: 2.2 g/dL — ABNORMAL LOW (ref 3.5–5.2)
BUN: 63 mg/dL — ABNORMAL HIGH (ref 6–23)
Calcium: 7.4 mg/dL — ABNORMAL LOW (ref 8.4–10.5)
Creatinine, Ser: 5.28 mg/dL — ABNORMAL HIGH (ref 0.50–1.10)
GFR calc Af Amer: 9 mL/min — ABNORMAL LOW (ref >60)
Total Protein: 6.8 g/dL (ref 6.0–8.3)

## 2011-03-30 LAB — COMPREHENSIVE METABOLIC PANEL
ALT: 115 U/L — ABNORMAL HIGH (ref 0–35)
AST: 17 U/L (ref 0–37)
Alkaline Phosphatase: 215 U/L — ABNORMAL HIGH (ref 39–117)
CO2: 19 mEq/L (ref 19–32)
GFR calc Af Amer: 10 mL/min — ABNORMAL LOW (ref >60)
GFR calc non Af Amer: 8 mL/min — ABNORMAL LOW (ref >60)
Glucose, Bld: 84 mg/dL (ref 70–99)
Potassium: 3.5 mEq/L (ref 3.5–5.1)
Sodium: 138 mEq/L (ref 135–145)
Total Protein: 7 g/dL (ref 6.0–8.3)

## 2011-03-31 LAB — CBC
HCT: 29.6 % — ABNORMAL LOW (ref 36.0–46.0)
MCH: 26.7 pg (ref 26.0–34.0)
MCHC: 34.1 g/dL (ref 30.0–36.0)
RDW: 14 % (ref 11.5–15.5)

## 2011-03-31 LAB — COMPREHENSIVE METABOLIC PANEL
Albumin: 2.3 g/dL — ABNORMAL LOW (ref 3.5–5.2)
Alkaline Phosphatase: 220 U/L — ABNORMAL HIGH (ref 39–117)
BUN: 60 mg/dL — ABNORMAL HIGH (ref 6–23)
Calcium: 7.5 mg/dL — ABNORMAL LOW (ref 8.4–10.5)
GFR calc Af Amer: 10 mL/min — ABNORMAL LOW (ref >60)
Glucose, Bld: 66 mg/dL — ABNORMAL LOW (ref 70–99)
Potassium: 3.2 mEq/L — ABNORMAL LOW (ref 3.5–5.1)
Total Protein: 7.2 g/dL (ref 6.0–8.3)

## 2011-04-02 ENCOUNTER — Emergency Department (HOSPITAL_COMMUNITY): Payer: Medicare Other

## 2011-04-02 ENCOUNTER — Inpatient Hospital Stay (HOSPITAL_COMMUNITY)
Admission: EM | Admit: 2011-04-02 | Discharge: 2011-04-06 | DRG: 640 | Disposition: A | Discharge status: Home Health | Payer: Medicare Other | Attending: Family Medicine | Admitting: Family Medicine

## 2011-04-02 ENCOUNTER — Encounter: Payer: Self-pay | Admitting: Emergency Medicine

## 2011-04-02 DIAGNOSIS — Z96659 Presence of unspecified artificial knee joint: Secondary | ICD-10-CM

## 2011-04-02 DIAGNOSIS — K8020 Calculus of gallbladder without cholecystitis without obstruction: Secondary | ICD-10-CM | POA: Diagnosis present

## 2011-04-02 DIAGNOSIS — I129 Hypertensive chronic kidney disease with stage 1 through stage 4 chronic kidney disease, or unspecified chronic kidney disease: Secondary | ICD-10-CM | POA: Diagnosis present

## 2011-04-02 DIAGNOSIS — A419 Sepsis, unspecified organism: Secondary | ICD-10-CM | POA: Diagnosis present

## 2011-04-02 DIAGNOSIS — E860 Dehydration: Principal | ICD-10-CM | POA: Diagnosis present

## 2011-04-02 DIAGNOSIS — N184 Chronic kidney disease, stage 4 (severe): Secondary | ICD-10-CM | POA: Diagnosis present

## 2011-04-02 DIAGNOSIS — Z8541 Personal history of malignant neoplasm of cervix uteri: Secondary | ICD-10-CM

## 2011-04-02 DIAGNOSIS — R471 Dysarthria and anarthria: Secondary | ICD-10-CM | POA: Diagnosis present

## 2011-04-02 DIAGNOSIS — A4159 Other Gram-negative sepsis: Secondary | ICD-10-CM | POA: Diagnosis present

## 2011-04-02 DIAGNOSIS — R627 Adult failure to thrive: Secondary | ICD-10-CM | POA: Diagnosis present

## 2011-04-02 DIAGNOSIS — I5032 Chronic diastolic (congestive) heart failure: Secondary | ICD-10-CM | POA: Diagnosis present

## 2011-04-02 DIAGNOSIS — R5381 Other malaise: Secondary | ICD-10-CM | POA: Diagnosis present

## 2011-04-02 DIAGNOSIS — E876 Hypokalemia: Secondary | ICD-10-CM | POA: Diagnosis not present

## 2011-04-02 DIAGNOSIS — J150 Pneumonia due to Klebsiella pneumoniae: Secondary | ICD-10-CM | POA: Diagnosis present

## 2011-04-02 DIAGNOSIS — Z7982 Long term (current) use of aspirin: Secondary | ICD-10-CM

## 2011-04-02 DIAGNOSIS — Z79899 Other long term (current) drug therapy: Secondary | ICD-10-CM

## 2011-04-02 DIAGNOSIS — Z66 Do not resuscitate: Secondary | ICD-10-CM | POA: Diagnosis present

## 2011-04-02 DIAGNOSIS — M1990 Unspecified osteoarthritis, unspecified site: Secondary | ICD-10-CM | POA: Diagnosis present

## 2011-04-02 LAB — COMPREHENSIVE METABOLIC PANEL
ALT: 69 U/L — ABNORMAL HIGH (ref 0–35)
AST: 39 U/L — ABNORMAL HIGH (ref 0–37)
Alkaline Phosphatase: 227 U/L — ABNORMAL HIGH (ref 39–117)
CO2: 20 mEq/L (ref 19–32)
Calcium: 7.5 mg/dL — ABNORMAL LOW (ref 8.4–10.5)
Chloride: 96 mEq/L (ref 96–112)
GFR calc Af Amer: 9 mL/min — ABNORMAL LOW (ref >60)
GFR calc non Af Amer: 7 mL/min — ABNORMAL LOW (ref >60)
Glucose, Bld: 87 mg/dL (ref 70–99)
Potassium: 4 mEq/L (ref 3.5–5.1)
Sodium: 135 mEq/L (ref 135–145)

## 2011-04-02 LAB — DIFFERENTIAL
Basophils Relative: 1 % (ref 0–1)
Eosinophils Relative: 1 % (ref 0–5)
Lymphocytes Relative: 16 % (ref 12–46)
Monocytes Absolute: 1.7 10*3/uL — ABNORMAL HIGH (ref 0.1–1.0)
Monocytes Relative: 14 % — ABNORMAL HIGH (ref 3–12)
Neutrophils Relative %: 68 % (ref 43–77)

## 2011-04-02 LAB — CBC
HCT: 31.8 % — ABNORMAL LOW (ref 36.0–46.0)
Hemoglobin: 10.5 g/dL — ABNORMAL LOW (ref 12.0–15.0)
RBC: 3.91 MIL/uL (ref 3.87–5.11)
WBC: 11.8 10*3/uL — ABNORMAL HIGH (ref 4.0–10.5)

## 2011-04-02 LAB — URINALYSIS, ROUTINE W REFLEX MICROSCOPIC
Bilirubin Urine: NEGATIVE
Glucose, UA: NEGATIVE mg/dL
Protein, ur: NEGATIVE mg/dL
Specific Gravity, Urine: 1.015 (ref 1.005–1.030)
Urobilinogen, UA: 0.2 mg/dL (ref 0.0–1.0)

## 2011-04-02 LAB — LACTIC ACID, PLASMA: Lactic Acid, Venous: 1 mmol/L (ref 0.5–2.2)

## 2011-04-02 LAB — URINE MICROSCOPIC-ADD ON

## 2011-04-02 LAB — PRO B NATRIURETIC PEPTIDE: Pro B Natriuretic peptide (BNP): 1540 pg/mL — ABNORMAL HIGH (ref 0–450)

## 2011-04-02 LAB — TROPONIN I: Troponin I: <0.3 ng/mL (ref <0.30)

## 2011-04-02 NOTE — H&P (Signed)
Frances [name] is an 77 y.o. female.   Chief Complaint: Not eating or drinking for the last 2 days HPI: Frances [name] is a 77 year old female with a history of hypertension, CKD stage IV refusing HD, and recent hospitalization for gram-negative rod sepsis and pneumonia presenting today with poor oral intake and dehydration.  Frances [name] states that the last time she ate was on Tuesday, although she is currently hungry.   She complains of some abdominal pain, nausea and diarrhea.  However denies chest pain, shortness of breath, and dysuria.  States that she has been taking her Avelox daily for her pneumonia/sepsis.  History is difficult to obtain due to dysarthria and poor historian.  Frances [name] has received a NS bolus in the ED.   Past Medical History  Diagnosis Date  . CAD (coronary artery disease) 2005    Last cath in 2005 - no significant disease  . Normal echocardiogram 2009    mild findings EJ fx 57   CKD stage IV, refusing HD Hypertension  Past surgical history 1. Knee replacement 2. ?total abdominal hysterectomy in 1975  Family History: Unknown  Social History: Lives alone with home health nurse Antionette who assists her (her number is [phone]).  does not have a smoking history on file. She does not have any smokeless tobacco history on file. Her alcohol and drug histories not on file.  Allergies: Allergies not on file  Medications prior to admission: amlodipine 10mg  1 tab daily Aspirin 81mg  i tab daily Calcitriol 0.25 mcg 1 capsule daily Clonidine pathch 0.2mg /24hrs, 1 patch transdermally weekly on Saturday Metoprolol XR 1 tab daily Oxybutynin 5mg  1/2 tab BID Sensipar 60mg  1 tab daily Furosemide 80mg  1 tab daily Avelox 400mg  1 tab daily   Results for orders placed during the hospital encounter of 04/02/11 (from the past 48 hour(s))  DIFFERENTIAL     Status: Abnormal   Collection Time   04/02/11  4:12 PM      Component Value Range Comment   Neutrophils Relative 68   43 - 77 (%)    Lymphocytes Relative 16  12 - 46 (%)    Monocytes Relative 14 (*) 3 - 12 (%)    Eosinophils Relative 1  0 - 5 (%)    Basophils Relative 1  0 - 1 (%)    Neutro Abs 8.0 (*) 1.7 - 7.7 (K/uL)    Lymphs Abs 1.9  0.7 - 4.0 (K/uL)    Monocytes Absolute 1.7 (*) 0.1 - 1.0 (K/uL)    Eosinophils Absolute 0.1  0.0 - 0.7 (K/uL)    Basophils Absolute 0.1  0.0 - 0.1 (K/uL)    WBC Morphology MILD LEFT SHIFT (1-5% METAS, OCC MYELO, OCC BANDS)   SLIGHT TOXIC GRANULATION AND FEW ATYPICAL LYMPHS NOTED   Smear Review LARGE PLATELETS PRESENT     CBC     Status: Abnormal   Collection Time   04/02/11  4:12 PM      Component Value Range Comment   WBC 11.8 (*) 4.0 - 10.5 (K/uL)    RBC 3.91  3.87 - 5.11 (MIL/uL)    Hemoglobin 10.5 (*) 12.0 - 15.0 (g/dL)    HCT 45.4 (*) 09.8 - 46.0 (%)    MCV 81.3  78.0 - 100.0 (fL)    MCH 26.9  26.0 - 34.0 (pg)    MCHC 33.0  30.0 - 36.0 (g/dL)    RDW 11.9  14.7 - 82.9 (%)    Platelets  304  150 - 400 (K/uL)   LACTIC ACID, PLASMA     Status: Normal   Collection Time   04/02/11  4:12 PM      Component Value Range Comment   Lactic Acid, Venous 1.0  0.5 - 2.2 (mmol/L)   COMPREHENSIVE METABOLIC PANEL     Status: Abnormal   Collection Time   04/02/11  4:12 PM      Component Value Range Comment   Sodium 135  135 - 145 (mEq/L)    Potassium 4.0  3.5 - 5.1 (mEq/L)    Chloride 96  96 - 112 (mEq/L)    CO2 20  19 - 32 (mEq/L)    Glucose, Bld 87  70 - 99 (mg/dL)    BUN 61 (*) 6 - 23 (mg/dL)    Creatinine, Ser 1.61 (*) 0.50 - 1.10 (mg/dL) **Please note change in reference range.**   Calcium 7.5 (*) 8.4 - 10.5 (mg/dL)    Total Protein 8.6 (*) 6.0 - 8.3 (g/dL)    Albumin 2.9 (*) 3.5 - 5.2 (g/dL)    AST 39 (*) 0 - 37 (U/L)    ALT 69 (*) 0 - 35 (U/L)    Alkaline Phosphatase 227 (*) 39 - 117 (U/L)    Total Bilirubin 0.5  0.3 - 1.2 (mg/dL)    GFR calc non Af Amer 7 (*) >60 (mL/min)    GFR calc Af Amer 9 (*) >60 (mL/min)   LIPASE, BLOOD     Status: Abnormal   Collection  Time   04/02/11  4:12 PM      Component Value Range Comment   Lipase 94 (*) 11 - 59 (U/L)   URINALYSIS, ROUTINE W REFLEX MICROSCOPIC     Status: Abnormal   Collection Time   04/02/11  5:24 PM      Component Value Range Comment   Color, Urine YELLOW  YELLOW     Appearance CLOUDY (*) CLEAR     Specific Gravity, Urine 1.015  1.005 - 1.030     pH 5.0  5.0 - 8.0     Glucose, UA NEGATIVE  NEGATIVE (mg/dL)    Hgb urine dipstick TRACE (*) NEGATIVE     Bilirubin Urine NEGATIVE  NEGATIVE     Ketones NEGATIVE  NEGATIVE (mg/dL)    Protein NEGATIVE  NEGATIVE (mg/dL)    Urobilinogen, UA 0.2  0.0 - 1.0 (mg/dL)    Nitrite NEGATIVE  NEGATIVE     Leukocytes, UA NEGATIVE  NEGATIVE    URINE MICROSCOPIC-ADD ON     Status: Abnormal   Collection Time   04/02/11  5:24 PM      Component Value Range Comment   Squamous Epithelial / LPF FEW (*) RARE     WBC, UA 0-2  <3 (WBC/hpf)    RBC / HPF 0-2  <3 (RBC/hpf)    Urine-Other AMORPHOUS URATES/PHOSPHATES   RARE YEAST  TROPONIN I     Status: Normal   Collection Time   04/02/11  6:14 PM      Component Value Range Comment   Troponin I <0.30  <0.30 (ng/mL)     ROS see HPI   Physical Exam  Vitals: T 97.5 BP 130/51 P 73 R 18 O2 sat 97% on room air Gen: elderly woman lying comfortably in bed; difficult to arouse and assess due to poor participation in exam HEENT: atraumatic, normocephalic; pupils are equal; sclera anicteric Neck: no JVP noted CV: regular rate and rhythm; no murmurs Lungs:  poor inspiratory effort; crackles in right lung base Abdomen: soft, +BS, appears mildly distended, mild diffuse tenderness on palpation Extremities: 2+ peripheral pulses; no leg edema Neuro: oriented x3; speech dysarthric; mood intermittently inappropriate; strength 3-4/5 and symmetric in all extremities  Assessment/Plan 77 yo woman with CKD stage IV refusing HD, hypertension and recent sepsis presenting with weakness, dehydration and failure to thrive.  1.  Dehydration/Failure to thrive: Unlikely to be severe dehydration due to no hemoconcentration, normal lactate and physical exam findings.  Cardiac markers are negative x1, making a cardiac event unlikely.  It is unclear why the patient has not been eating for the past two days, especially since she expresses hunger currently.  It appears that there is poor social support at home and deconditioning may be playing a role in her current state.  Plan is to provide maintenance IVFs with D5 1/2NS at 124mL/hr and start her on a clear diet, advance as tolerated.  Given the outcome of her discharge on July 3rd, we will start looking for placement.  2. Decrease mental status: Unknown baseline.  Will obtain a head CT.  Does have a slight leukocytosis with mild left shift, however no physical findings to indicate a locus of infection.  Is still taking Avelox for prior Klebsiella pneumonia/sepsis.  2.  Diastolic CHF: EF 81-19% on echo in 03/2011. Will hold lasix due to dehydration.  3. CKD stage IV: Creatinine has risen from 4.96 at discharge on 7/3 to 5.69 on admission.  Patient has refused hemodialysis and is being managed with medications.  Will continue calcitriol 0.45mcg daily and sensipar 60mg  daily.  4. Hypertension: This currently adequately controlled with her home medications.  Will continue clonidine patch 0.2mg /24hr and metoprolol XR 100mg  daily.  Will hold amlodipine due to risk of fluid overload with fluids.  5. Resolved Klebsiella bacteremia and pneumonia: will continue Avelox 400mg  daily for another 7 days for a total of 14 days.  6. FEN/GI: D5 1/2NS at 136ml/hr; clear liquid diet, advance as tolerated  7. Prophylaxis: SQ heparin  8. Code Status: DNR/DNI  9. Dispo: pending placement  BOOTH, ERIN 04/02/2011, 10:13 PM

## 2011-04-03 ENCOUNTER — Encounter: Payer: Self-pay | Admitting: Family Medicine

## 2011-04-03 ENCOUNTER — Other Ambulatory Visit (HOSPITAL_COMMUNITY): Payer: Medicare Other

## 2011-04-03 DIAGNOSIS — E860 Dehydration: Secondary | ICD-10-CM

## 2011-04-03 DIAGNOSIS — N179 Acute kidney failure, unspecified: Secondary | ICD-10-CM

## 2011-04-03 DIAGNOSIS — R109 Unspecified abdominal pain: Secondary | ICD-10-CM

## 2011-04-03 DIAGNOSIS — N189 Chronic kidney disease, unspecified: Secondary | ICD-10-CM

## 2011-04-03 LAB — CBC
Hemoglobin: 9.5 g/dL — ABNORMAL LOW (ref 12.0–15.0)
MCH: 26.7 pg (ref 26.0–34.0)
MCHC: 33 g/dL (ref 30.0–36.0)
MCV: 80.9 fL (ref 78.0–100.0)
RBC: 3.56 MIL/uL — ABNORMAL LOW (ref 3.87–5.11)

## 2011-04-03 LAB — BASIC METABOLIC PANEL
BUN: 55 mg/dL — ABNORMAL HIGH (ref 6–23)
CO2: 18 mEq/L — ABNORMAL LOW (ref 19–32)
Calcium: 6.7 mg/dL — ABNORMAL LOW (ref 8.4–10.5)
GFR calc non Af Amer: 9 mL/min — ABNORMAL LOW (ref >60)
Glucose, Bld: 99 mg/dL (ref 70–99)

## 2011-04-03 NOTE — H&P (Signed)
Frances [name] is an 77 y.o. female.   Chief Complaint: Decreased PO intake x2 days  HPI:   Briefly, this is a 77 yo female with PMH HTN, CKD Stage IV who was recently hospitalized for GNR sepsis and PNA.  Today she presents with poor PO intake x 2 days.  Patient's speech is slurred and she is difficult to understand which makes her a poor historian.  She says that slurred speech is not a new finding.  Patient states that the last time she ate or drank was Tuesday due to decreased appetite.  She also complains of nausea, diarrhea, and abdominal pain.  Denies any emesis, chest pain, SOB, or dysuria.  Denies fever , chills, diaphoresis.  She says she has been taking all of her medications, including the Avelox for PNA.  Currently, patient is hungry and asking for food.  In ED, she has had no episodes or nausea or diarrhea.      Past Medical History  Diagnosis Date  . CAD (coronary artery disease) 2005    Last cath in 2005 - no significant disease  . Normal echocardiogram 2009    mild findings EJ fx 57     No past surgical history on file.  No family history on file. Social History:  does not have a smoking history on file. She does not have any smokeless tobacco history on file. Her alcohol and drug histories not on file.  Allergies: Allergies not on file  No current facility-administered medications on file as of 04/03/2011.   Medications Prior to Admission  Medication Sig Dispense Refill  . aspirin 81 MG tablet Take 1 tablet (81 mg total) by mouth daily.  90 tablet  3  . cinacalcet (SENSIPAR) 60 MG tablet Take 60 mg by mouth daily. per Dr Briant Cedar       . cloNIDine (CATAPRES) 0.1 MG tablet Take 0.1 mg by mouth 2 (two) times daily.        . furosemide (LASIX) 80 MG tablet Take 160 mg by mouth every morning.        . metoprolol (TOPROL-XL) 100 MG 24 hr tablet Take 1 tablet (100 mg total) by mouth daily.  31 tablet  11  . oxybutynin (DITROPAN) 5 MG tablet Take 0.5 tablets (2.5 mg total) by  mouth 2 (two) times daily.  30 tablet  3  . traMADol (ULTRAM) 50 MG tablet Take 50 mg by mouth 2 (two) times daily as needed.          Results for orders placed during the hospital encounter of 04/02/11 (from the past 48 hour(s))  DIFFERENTIAL     Status: Abnormal   Collection Time   04/02/11  4:12 PM      Component Value Range Comment   Neutrophils Relative 68  43 - 77 (%)    Lymphocytes Relative 16  12 - 46 (%)    Monocytes Relative 14 (*) 3 - 12 (%)    Eosinophils Relative 1  0 - 5 (%)    Basophils Relative 1  0 - 1 (%)    Neutro Abs 8.0 (*) 1.7 - 7.7 (K/uL)    Lymphs Abs 1.9  0.7 - 4.0 (K/uL)    Monocytes Absolute 1.7 (*) 0.1 - 1.0 (K/uL)    Eosinophils Absolute 0.1  0.0 - 0.7 (K/uL)    Basophils Absolute 0.1  0.0 - 0.1 (K/uL)    WBC Morphology MILD LEFT SHIFT (1-5% METAS, OCC MYELO, OCC BANDS)  SLIGHT TOXIC GRANULATION AND FEW ATYPICAL LYMPHS NOTED   Smear Review LARGE PLATELETS PRESENT     CBC     Status: Abnormal   Collection Time   04/02/11  4:12 PM      Component Value Range Comment   WBC 11.8 (*) 4.0 - 10.5 (K/uL)    RBC 3.91  3.87 - 5.11 (MIL/uL)    Hemoglobin 10.5 (*) 12.0 - 15.0 (g/dL)    HCT 16.1 (*) 09.6 - 46.0 (%)    MCV 81.3  78.0 - 100.0 (fL)    MCH 26.9  26.0 - 34.0 (pg)    MCHC 33.0  30.0 - 36.0 (g/dL)    RDW 04.5  40.9 - 81.1 (%)    Platelets 304  150 - 400 (K/uL)   LACTIC ACID, PLASMA     Status: Normal   Collection Time   04/02/11  4:12 PM      Component Value Range Comment   Lactic Acid, Venous 1.0  0.5 - 2.2 (mmol/L)   COMPREHENSIVE METABOLIC PANEL     Status: Abnormal   Collection Time   04/02/11  4:12 PM      Component Value Range Comment   Sodium 135  135 - 145 (mEq/L)    Potassium 4.0  3.5 - 5.1 (mEq/L)    Chloride 96  96 - 112 (mEq/L)    CO2 20  19 - 32 (mEq/L)    Glucose, Bld 87  70 - 99 (mg/dL)    BUN 61 (*) 6 - 23 (mg/dL)    Creatinine, Ser 9.14 (*) 0.50 - 1.10 (mg/dL) **Please note change in reference range.**   Calcium 7.5 (*) 8.4 - 10.5  (mg/dL)    Total Protein 8.6 (*) 6.0 - 8.3 (g/dL)    Albumin 2.9 (*) 3.5 - 5.2 (g/dL)    AST 39 (*) 0 - 37 (U/L)    ALT 69 (*) 0 - 35 (U/L)    Alkaline Phosphatase 227 (*) 39 - 117 (U/L)    Total Bilirubin 0.5  0.3 - 1.2 (mg/dL)    GFR calc non Af Amer 7 (*) >60 (mL/min)    GFR calc Af Amer 9 (*) >60 (mL/min)   LIPASE, BLOOD     Status: Abnormal   Collection Time   04/02/11  4:12 PM      Component Value Range Comment   Lipase 94 (*) 11 - 59 (U/L)   URINALYSIS, ROUTINE W REFLEX MICROSCOPIC     Status: Abnormal   Collection Time   04/02/11  5:24 PM      Component Value Range Comment   Color, Urine YELLOW  YELLOW     Appearance CLOUDY (*) CLEAR     Specific Gravity, Urine 1.015  1.005 - 1.030     pH 5.0  5.0 - 8.0     Glucose, UA NEGATIVE  NEGATIVE (mg/dL)    Hgb urine dipstick TRACE (*) NEGATIVE     Bilirubin Urine NEGATIVE  NEGATIVE     Ketones NEGATIVE  NEGATIVE (mg/dL)    Protein NEGATIVE  NEGATIVE (mg/dL)    Urobilinogen, UA 0.2  0.0 - 1.0 (mg/dL)    Nitrite NEGATIVE  NEGATIVE     Leukocytes, UA NEGATIVE  NEGATIVE    URINE MICROSCOPIC-ADD ON     Status: Abnormal   Collection Time   04/02/11  5:24 PM      Component Value Range Comment   Squamous Epithelial / LPF FEW (*) RARE  WBC, UA 0-2  <3 (WBC/hpf)    RBC / HPF 0-2  <3 (RBC/hpf)    Urine-Other AMORPHOUS URATES/PHOSPHATES   RARE YEAST  TROPONIN I     Status: Normal   Collection Time   04/02/11  6:14 PM      Component Value Range Comment   Troponin I <0.30  <0.30 (ng/mL)   PRO B NATRIURETIC PEPTIDE     Status: Abnormal   Collection Time   04/02/11 11:00 PM      Component Value Range Comment   BNP, POC 1540.0 (*) 0 - 450 (pg/mL)   CK TOTAL AND CKMB     Status: Normal   Collection Time   04/02/11 11:00 PM      Component Value Range Comment   Total CK 108  7 - 177 (U/L)    CK, MB 2.4  0.3 - 4.0 (ng/mL)    Relative Index 2.2  0.0 - 2.5    TROPONIN I     Status: Normal   Collection Time   04/02/11 11:00 PM      Component  Value Range Comment   Troponin I <0.30  <0.30 (ng/mL)     ROS  Per HPI  Vitals: T 97.5, BP 130/51, RR 18, 97% on RA  Physical Exam  Constitutional: She appears well-developed and well-nourished. No distress.       Drowsy, but arousable  HENT:  Head: Normocephalic and atraumatic.  Eyes: EOM are normal. Pupils are equal, round, and reactive to light.  Neck: Normal range of motion. Neck supple.  Cardiovascular: Normal rate, regular rhythm and normal heart sounds.  Exam reveals no gallop and no friction rub.   No murmur heard. Respiratory: Effort normal. No respiratory distress. She has rales. She exhibits no tenderness.       Crackles appreciated R lower lung base > L, poor inspiratory effort  GI: Soft. Bowel sounds are normal. She exhibits no distension. There is no tenderness.  Musculoskeletal:       2+ peripheral pulses, no cyanosis/clubbing, trace pedal non-pitting edema  Neurological:       Drowsy, but oriented to place and time; slurred speech with asymmetric smile (possible L facial droop); strength 3/5 bil upper and lower extremities  Psychiatric:       laughs inappropriately      Assessment/Plan 77 yo female with stage IV CKD, HTN, and recent hospitalization for sepsis/PNA presents with decrease PO intake and dehydration 1) Dehydration: likely multi-factorial.  May be secondary to viral gastroenteritis vs. Adverse effect to antibiotics vs. Inability to care for herself at home.  Will start IVF at 125 cc/hr.  Will hold Lasix home dose for now given acute setting of decreased PO intake and elevated Creatinine.  Will consult PT/OT for deconditioning.  2) Altered mental status: baseline mentation difficult to assess without patient's caregiver present.  Per patient, she has had a CVA in past, but no documented records of this.   Will get a head CT to rule out any acute bleeds.  Will speak to PCP and primary team regarding patient's baseline mentation.  3) Diastolic CHF: EF  40-45% from last admission.  Does not appear to be fluid overloaded in any acute exacerbation.  Will hold Lasix for now in case patient is truly dehydrated.  Will follow strict ins/outs and be mindful to discontinue fluids when patient eating/drinking.  4) CKD: patient refused HD at last admission.  Cr has risen from 4.96 at discharge to 5.69.  Will continue renal vitamins.  Repeat BMET in am.  5) HTN: BP currently stable in ED. Will continue Metoprolol and Clonidine patch.  Hold Amlodipine for now.  6) PNA: continue with Avelox 400 mg po daily.  7) FEN/GI: clears for now, then advance diet as tolerated; MIVF.  8) PPx: Heparin SQ  9) Code status: DNR/DNI  10) Dispo: pending clinical improvement and SW for placment   DE LA CRUZ,IVY 04/03/2011, 2:06 AM

## 2011-04-04 ENCOUNTER — Inpatient Hospital Stay (HOSPITAL_COMMUNITY): Payer: Medicare Other

## 2011-04-04 LAB — COMPREHENSIVE METABOLIC PANEL
ALT: 37 U/L — ABNORMAL HIGH (ref 0–35)
CO2: 19 mEq/L (ref 19–32)
Calcium: 6.7 mg/dL — ABNORMAL LOW (ref 8.4–10.5)
Creatinine, Ser: 4.8 mg/dL — ABNORMAL HIGH (ref 0.50–1.10)
GFR calc Af Amer: 11 mL/min — ABNORMAL LOW (ref >60)
GFR calc non Af Amer: 9 mL/min — ABNORMAL LOW (ref >60)
Glucose, Bld: 84 mg/dL (ref 70–99)
Sodium: 135 mEq/L (ref 135–145)
Total Protein: 7.2 g/dL (ref 6.0–8.3)

## 2011-04-04 LAB — CBC
MCH: 26.3 pg (ref 26.0–34.0)
MCHC: 32.6 g/dL (ref 30.0–36.0)
MCV: 80.7 fL (ref 78.0–100.0)
Platelets: 332 10*3/uL (ref 150–400)
RBC: 3.57 MIL/uL — ABNORMAL LOW (ref 3.87–5.11)

## 2011-04-04 MED ORDER — TECHNETIUM TC 99M MEBROFENIN IV KIT
5.0000 | PACK | Freq: Once | INTRAVENOUS | Status: AC | PRN
  Administered 2011-04-04: 5 via INTRAVENOUS

## 2011-04-06 LAB — BASIC METABOLIC PANEL
Chloride: 104 mEq/L (ref 96–112)
GFR calc Af Amer: 12 mL/min — ABNORMAL LOW (ref >60)
GFR calc non Af Amer: 10 mL/min — ABNORMAL LOW (ref >60)
Potassium: 4.6 mEq/L (ref 3.5–5.1)
Sodium: 136 mEq/L (ref 135–145)

## 2011-04-08 ENCOUNTER — Encounter: Payer: Self-pay | Admitting: Family Medicine

## 2011-04-08 ENCOUNTER — Ambulatory Visit (INDEPENDENT_AMBULATORY_CARE_PROVIDER_SITE_OTHER): Payer: Medicare Other | Admitting: Family Medicine

## 2011-04-08 VITALS — BP 122/62 | HR 90 | Temp 98.1°F | Wt 148.0 lb

## 2011-04-08 DIAGNOSIS — J189 Pneumonia, unspecified organism: Secondary | ICD-10-CM

## 2011-04-08 DIAGNOSIS — Z789 Other specified health status: Secondary | ICD-10-CM

## 2011-04-08 DIAGNOSIS — I10 Essential (primary) hypertension: Secondary | ICD-10-CM

## 2011-04-08 DIAGNOSIS — R3911 Hesitancy of micturition: Secondary | ICD-10-CM

## 2011-04-08 NOTE — Assessment & Plan Note (Signed)
[facility] is seeing her daily for 44 hrs per week.  Her aide is [employer].   They will see if she qualifies for more.  Her Son in [place] who lives in [place] is staying with her now.

## 2011-04-08 NOTE — Progress Notes (Signed)
Subjective:    Patient ID: Frances [name], female    DOB: [birth year], 77 y.o.   MRN: [id number]  HPI  Pneumonia No fever or cough or shortness of breath  Taking avelox daily without problems  Hesitancy Feels she can't urinated well when she needs to.  No dysuria or frequency or back pain.   On avelox. No abdominal fullness  HYPERTENSION Disease Monitoring Blood pressure range-not checking Chest pain- no      Dyspnea- no Medications Compliance- brings in all her meds.  HH sets up pill boxes Lightheadedness- no   Edema- better than usual  Review of Symptoms - see HPI  Review of Systems   PMH NAME:  Frances [name]                 ACCOUNT NO.:  [id number]      MEDICAL RECORD NO.:  [id number]      LOCATION:  [facility]                         FACILITY:  [facility]      PHYSICIAN:  Frances [name], M.D.DATE OF BIRTH:  1929-03-21      DATE OF ADMISSION:  04/02/2011   DATE OF DISCHARGE:  04/06/2011                                  DISCHARGE SUMMARY         PRIMARY CARE PHYSICIAN:  Frances Brownie, MD, [facility].      CONSULTS:  None.      REASON FOR ADMISSION:  Failure to thrive and dehydration.      PRIMARY DISCHARGE DIAGNOSIS:  Failure to thrive and dehydration.      SECONDARY DISCHARGE DIAGNOSES:   1. Diastolic congestive heart failure and she has an ejection fraction       of 40-45% on echo in July 2012.   2. Chronic kidney disease, stage IV.   3. Hypertension.   4. Resolved Klebsiella pneumonia/sepsis.      DISCHARGE MEDICATIONS:  New medications:   1. Furosemide 18 mg 1/2 tablet daily.   2. Avelox 400 mg 1 tablet daily.      Continued medications:   1. Amlodipine 10 mg 1 tablet daily.   2. Aspirin 81 mg 1 tablet daily.   3. Calcitriol 0.25 mcg 1 capsule daily.   4. Clonidine patch 0.2 mg/24 hours, 1 patch weekly.   5. Metoprolol XL 100 mg 1 tablet daily.   6. Oxybutynin 5 mg 1/2 tablet twice daily.   7. Sensipar 60 mg 1 tablet daily.      Discontinued medications:   1. Furosemide 80 mg 1 tablet daily.      HOSPITAL COURSE:  Frances [name] is a 77 year old woman with a history of   hypertension, chronic kidney disease stage IV receiving hemodialysis and   recent hospitalization for Klebsiella pneumonia and sepsis returning to   the hospital with 2 days of poor p.o. intake and dehydration.   1. Failure to thrive and dehydration.  Frances [name] gives a history of 2       days without eating.  It is unclear why this occurred, but she does       have limited social support at home. With poor intake, she became       dehydrated and weak, though her caregiver brought  her to the       emergency room.  Her creatinine was elevated.  Her mental status       was decreased and she appeared slightly dry.  She was started on IV       fluids and returned to her baseline mental status and creatinine       within 24 hours.  Given her recent bounce back, there was concern       about sending her home again.  Physical Therapy was consulted and       recommended home with 24-hour supervision for skilled nursing       facility.  The patient is adamantly opposed to going to a nursing       home.  A mini-mental status exam done while in the hospital scored       15/30 raising concern for Frances [name] capacity to make decision.       However, her caregiver Frances [name] was able to contact the patient's       son-in-law who agreed to stay with her and provide assistance at       home for the time being.  With this added to the 2 hours of daily       care giving provided Frances [name], it was felt to be safe to       discharge her home.  The importance of eating and drinking as well       as getting out of bed was stressed to the patient, her son-in-law,       and Frances [name].   2. Diastolic congestive heart failure.  Her Lasix was held due to       dehydration.  She was monitored for signs of fluid overload during       the hospitalization but none were noted.   As she returned to the       hospital with dehydration, we discharged her on half of her normal       Lasix dose.   3. Chronic kidney disease, stage IV.  Her creatinine was elevated       above her baseline at admission secondary to dehydration.  Her       creatinine rapidly returned to baseline with the administration of       IV fluids.  This was being managed medically as she is refusing       dialysis, so we continued her home calcitriol and Sensipar.   4. Hypertension.  This is well controlled with home medications.  We       held her amlodipine for concern of developing edema with IV fluids.       The clonidine and metoprolol were continued throughout the       hospitalization and she was restarted on her amlodipine at       discharge.   5. Resolved Klebsiella pneumonia/sepsis.  We continued her Avelox.       She will need a total of a 14-day course for an end date of April 14, 2011.  She may not have been taking the Avelox between her       discharge on July 3 and her readmission on April 02, 2011.  It was       reiterated that it is very important for her to complete the entire       course of antibiotics.      CONDITION ON DISCHARGE:  Stable.  PENDING TESTS:  None.      DISPOSITION:  Home with son-in-law and 2 hours of caregiving daily.      DISCHARGE FOLLOWUP:  With her primary care physician, Dr. Deirdre [name] at   the [facility] on the afternoon of April 08, 2011.      FOLLOWUP ISSUES:   1. Please address how things are going at home, make sure the son-in-       law feels comfortable with the level of care that Frances [name] needs.   2. Please make sure she is taking her antibiotic as prescribed.   3. It would be a good idea to address Frances [name] capacity at this       time given that she had an MMSE of 15 in the hospital.  Also       discussed future placement if it is needed and needing a power of       attorney.   4. Adjust her Lasix dose  if needed since we changed it on discharge.            ______________________________   Despina Hick, MD         ______________________________   Frances [name], M.D.            EB/MEDQ  D:  04/07/2011  T:  04/08/2011  Job:  [id number]      cc:   Frances [name], M.D.      Objective:   Physical Exam    Alert NAD Lungs:  Normal respiratory effort, chest expands symmetrically. Lungs are clear to auscultation, no crackles or wheezes. Heart - Regular rate and rhythm.  No murmurs, gallops or rubs.    Extremity - 1-2 + edema at ankles Abdomen - bladder not palpable and does not cause urgency     Assessment & Plan:

## 2011-04-08 NOTE — H&P (Signed)
NAMETARREN, SABREE NO.:  [id number]  MEDICAL RECORD NO.:  [id number]  LOCATION:  [facility]                         FACILITY:  [facility]  PHYSICIAN:  Paula Compton, MD        DATE OF BIRTH:  02-Aug-1929  DATE OF ADMISSION:  04/02/2011 DATE OF DISCHARGE:                             HISTORY & PHYSICAL   CHIEF COMPLAINT:  Not eating or drinking for the last 2 days.  HISTORY OF PRESENT ILLNESS:  Ms. Goldsmith is a 77 year old female with a history of hypertension, chronic kidney disease stage IV refusing hemodialysis, and recent hospitalization for gram-negative rod sepsis and pneumonia presenting today with poor oral intake and dehydration as well as weakness.  Ms. Carboni states that the last time she ate was on Tuesday, although she is currently hungry.  She complains of some abdominal pain, nausea, and diarrhea but no vomiting.  She denies chest pain, shortness of breath, and dysuria.  States she has been taking her Avelox daily for her pneumonia and sepsis treatment.  History is difficult to obtain due to dysarthria and the patient is a poor historian.  In the emergency department, Ms. Fennema received 250 mL of normal saline bolus.  PAST MEDICAL HISTORY: 1. Hypertension. 2. Chronic kidney disease stage IV. 3. Osteoarthritis. 4. Shoulder pain. 5. Weight loss. 6. Urinary incontinence.  PAST SURGICAL HISTORY: 1. Knee replacement. 2. A likely total abdominal hysterectomy due to cervical cancer in     1975.  SOCIAL HISTORY:  She lives alone at home.  Does have a home health nurse, Sherry Ruffing, who assists her.  [name]'s number is [phone]. We did not have a smoking history, smokeless tobacco, alcohol, or drug history on file.  FAMILY HISTORY:  Unknown.  ALLERGIES:  She has no known drug allergies.  MEDICATIONS PRIOR TO ADMISSION: 1. Amlodipine 10 mg 1 tablet daily. 2. Aspirin 81 mg 1 tablet daily. 3. Calcitriol 0.25 mcg 1 capsule daily. 4. Clonidine patch  0.2 mg per 24 hours 1 patch transdermally weekly on     Saturday. 5. Metoprolol XR 100 mg 1 tablet daily. 6. Oxybutynin 5 mg 1/2 tablet b.i.d. 7. Sensipar 60 mg 1 tablet daily. 8. Furosemide 80 mg 1 tablet daily. 9. Avelox 400 mg 1 tablet daily.  REVIEW OF SYSTEMS:  Please see HPI.  PHYSICAL EXAMINATION:  VITAL SIGNS:  Temperature 97.5, blood pressure 130/51, pulse 73, respiratory rate 18, O2 saturation 97% on room air. GENERAL:  She is an elderly woman lying comfortably in the bed, somewhat difficult to arouse and assess due to poor participation in the exam. HEENT:  Atraumatic, normocephalic.  Pupils are equal.  Sclerae anicteric. NECK:  No JVP noted. CV:  Regular rate and rhythm.  No murmurs. LUNGS:  Poor inspiratory effort.  Crackles in the right lung base. ABDOMEN:  Soft, positive bowel sounds.  Appears mildly distended and mild diffuse tenderness on palpation. EXTREMITIES:  A 2+ peripheral pulses.  No leg edema. NEURO:  Oriented x3.  Dysarthric speech.  Mood intermittently inappropriate.  Strength, 3-4/5 and symmetric in all extremities.  LABORATORY DATA AND STUDIES:  She had a CBC that showed a white count of 11.8,  hemoglobin 10.5, hematocrit 31.8, platelets 304.  Lactic acid was 1.0.  A comprehensive metabolic panel showed sodium 135, potassium 4.0, chloride 96, bicarb 20, BUN 61, creatinine 5.69, glucose 87, albumin 2.9, AST 39, ALT 69, alk phos 227, total bilirubin 0.5, lipase mildly elevated at 94.  A urinalysis was negative except for trace hemoglobin on dipstick.  Troponin was negative.  ASSESSMENT AND PLAN:  Ms. Poland is a 77 year old woman with chronic kidney disease stage IV refusing hemodialysis, hypertension, and recent sepsis presenting with weakness, dehydration, and failure to thrive. 1. Dehydration, failure to thrive.  It is unlikely to be severe     dehydration due to no hemoconcentration, normal lactate and     physical exam findings.  Cardiac markers  are negative x1 making a     cardiac event unlikely.  It is unclear why the patient has not been     eating for the past 2 days, especially since she expresses hunger     currently.  It appears that there is poor social support at home     and deconditioning may be playing a role in her current state.     Plan is for IV fluids at maintenance with D5 half-normal saline at     125 an hour and start her on a clear liquid diet, advance as     tolerated.  Given the outcome for discharge on March 31, 2011, we     will start looking for placement. 2. Decreased mental status.  She has an unknown baseline.  We will     obtain a head CT.  Does have a slight leukocytosis; however, there     are no physical findings to indicate a locus of  infection.  She is     still on her antibiotics for her sepsis treatments. 3. Diastolic heart failure.  She has an ejection fraction of 40-45% on     echo earlier this month.  We will hold her Lasix dose due to     dehydration. 4. Chronic kidney disease stage IV.  Creatinine has risen from 4.96 at     discharge on March 31, 2011, to 5.69 on admission.  The patient     refused hemodialysis and has been managed with medications.  We     will continue her calcitriol and Sensipar. 5. Hypertension.  This is currently adequately controlled with her     home medications.  We will continue her clonidine patch and     metoprolol XR.  We are going to hold her amlodipine due to risk of     fluid overload with her IV fluids. 6. Resolved klebsiella bacteremia and pneumonia.  We will continue her     Avelox for another 7 days for a total of a 14-day course. 7. Fluids, electrolytes, nutrition/gastrointestinal.  D5 half-normal     saline at 125 an hour.  Clear liquid diet, advance as tolerated. 8. Prophylaxis.  Subcu heparin. 9. Code status.  DNR/DNI. 10.Disposition pending placement.    ______________________________ Despina Hick, MD   ______________________________ Paula Compton, MD    EB/MEDQ  D:  04/02/2011  T:  04/03/2011  Job:  [id number]  Electronically Signed by Despina Hick MD on 04/03/2011 10:15:24 PM Electronically Signed by Paula Compton MD on 04/08/2011 11:56:46 AM

## 2011-04-08 NOTE — Discharge Summary (Signed)
NAMECIANI, RUTTEN NO.:  [id number]  MEDICAL RECORD NO.:  [id number]  LOCATION:  2603                         FACILITY:  [facility]  PHYSICIAN:  Paula Compton, MD        DATE OF BIRTH:  1929-06-11  DATE OF ADMISSION:  03/26/2011 DATE OF DISCHARGE:  03/31/2011                              DISCHARGE SUMMARY   PRIMARY CARE PHYSICIAN:  Pearlean Brownie, MD, [facility].  DISCHARGE DIAGNOSES/PRIMARY DIAGNOSES: 1. Gram-negative rod sepsis. 2. Acute respiratory failure secondary to congestive heart failure and     possible pneumonia. 3. Congestive heart failure with ejection fraction of 40-45% and grade     1 diastolic dysfunction.  SECONDARY DIAGNOSES: 1. Hypertension. 2. Chronic kidney disease stage IV. 3. Osteoarthritis. 4. Shoulder pain. 5. Weight loss. 6. Urinary incontinence.  DISCHARGE MEDICATIONS:  New medications: 1. Avelox 400 mg by mouth daily, take for 9 days.  Changed medications: 1. Furosemide 80 mg by mouth daily instead of twice daily.  Home medications: 1. Amlodipine 10 mg 1 tablet p.o. daily. 2. Aspirin enteric-coated 81 mg 1 tablet p.o. daily. 3. Calcitriol 0.25 mcg 1 capsule p.o. daily. 4. Clonidine patch 0.2 mg/24 hours, 1 patch transdermally weekly on     Saturday. 5. Metoprolol extended-released succinate 1 tablet by mouth daily. 6. Oxybutynin 5 mg 1/2 tablet by mouth b.i.d. 7. Sensipar 60 mg 1 tablet by mouth daily.  CONSULTATIONS:  None.  PROCEDURES: 1. Echocardiogram with ejection fraction of 40-45%.  Grade 1 diastolic     dysfunction.  Mild aortic regurgitation. 2. V/Q scan on March 26, 2011:  Low probability for pulmonary embolism. 3. Chest x-ray, March 27, 2011:  Cardiomegaly, bibasilar atelectasis,     and mild vascular congestion. 4. Abdominal ultrasound, March 27, 2011 showing cholelithiasis with     gallbladder wall thickening.  Medical renal disease changes with     possible complicated cyst in  both kidneys, slightly decreased in     size on right and slightly increased in size on left since prior     study.  DISCHARGE LABORATORY DATA:  White count of 9.1 and hemoglobin of 10.1. Creatinine of 4.9 and potassium 3.2.  LFTs:  Bilirubin 0.5, alk phos 220, AST 15, and ALT 84.  LABORATORY DATA FROM HOSPITAL COURSE:  Blood culture greater than 100,000 Klebsiella pneumonia resistant ampicillin but otherwise pansensitive.  Urine culture:  No growth.  Peak white count 18.4. Admission hemoglobin 13.6.  Lactic acid on March 26, 2011 of 4.3.  BMP on March 26, 2011 of 3417.  Cardiac enzymes negative x3.  Creatinine on admission 3.5, peak 5.28, discharge 4.96.  LFTs on admission:  Bili of 3.0, alk phos of 283, AST of 308, and ALT of 491.  GGT of 84 on March 28, 2011.  BRIEF HOSPITAL COURSE:  [name] is a 77 year old female with stage IV chronic kidney disease and hypertension presenting with acute respiratory failure and Gram-negative rod sepsis.  The patient presented after week of worsening shortness of breath and weakness per [facility] worker which culminated with increasing weakness and nausea.  The patient presented to [facility] ED and was  admitted to the [facility] Medicine Teaching Service. 1. Gram-negative rod sepsis - the patient initially with tachycardia,     tachypnea, and hypertension with moderate response to 500 mL bolus.     The patient was initially started on ceftriaxone and azithromycin     for presumed pneumonia.  The patient was worked up for pulmonary     embolism and myocardial infarction for which she had a low     probability V/Q scan and cardiac enzymes negative x3.  Chest x-ray     x2 did not show any pneumonia but two blood cultures grew Gram-     negative rods for which coverage was broadened to Zosyn.     Antibiotic coverage was narrowed and sensitivities became available     on the Klebsiella pneumonia.  The patient's vital signs improved     steadily with  antibiotic treatment.  Urine cultures were also     negative.  Unknown source of bacteremia but presumed lung source.     The patient to complete a 14-day course of antibiotics with Avelox     at home. 2. Acute on chronic renal failure, stage IV chronic kidney disease.     Creatinine on admission of 3.5 with a peak of 5.28 and 2 days later     discharged at 4.96.  Continued on home Lasix 80 mg daily instead of     b.i.d. 3. Mixed congestive heart failure/shortness of breath - new diagnosis.     ACE inhibitor not initiated due to chronic renal disease.  We will     add gentle diuresis with 80 mg p.o. daily instead of home dose of     b.i.d.  Shortness of breath steadily improved throughout hospital     course.  The patient had no complaint of shortness of breath on     discharge. 4. Gallstone/elevated LFTs - transaminases initially elevated as well     as bili and alk phos.  These labs slowly trended down over her     hospital course.  The patient with known gallstones from abdominal     ultrasound.  Differential diagnosis includes passed stone or shock     liver from sepsis. 5. Hypertension - home meds initially held except for metoprolol.     Blood pressure stabilized over the course of hospital stay.  The     patient discharged on home blood pressure medications. 6. PT/OT - recommended Home Health PT 3 visits per week.  The patient     has a home health worker that stays with her 2 hours per day. 7. Urinary incontinence - continued home dose oxybutynin. 8. Hypokalemia - the patient with potassium of 3.2 on March 31, 2011.     Given 20 mEq of potassium chloride due to renal disease. 9. Anemia, likely a combination of dilution and numerous blood draws.     Discharged with stable hemoglobin of 10.1.  DISCHARGE INSTRUCTIONS:  The patient instructed to return to the hospital, call the [facility] if she has chest pain, her shortness of breath worsens or does not continue to  improve, or for fever greater than 100.4.  FOLLOWUP APPOINTMENTS:  Dr. Deirdre Priest at [facility] on April 08, 2011 at 10:00 a.m.  DISCHARGE CONDITION:  The patient was discharged home in stable medical condition with Home Health worker for 2 hours a day and Home Health PT visits 3 times per week.  FOLLOWUP ISSUES: 1. Complete metabolic panel  to evaluate electrolyte status,     particularly potassium, monitor renal function and ensure continued     decrease in LFTs.  Consider CBC to reevaluate anemia. 2. Evaluate her shortness of breath given CHF, right lower quadrant     pain given gallstones, and weakness given presentation of sepsis.     3.  BNP to evaluate improvement in CHF with continued diuresis.    ______________________________ Tana Conch, MD   ______________________________ Paula Compton, MD    SH/MEDQ  D:  04/01/2011  T:  04/02/2011  Job:  [id number]  cc:   Pearlean Brownie, M.D.  Electronically Signed by Tana Conch MD on 04/03/2011 07:37:36 PM Electronically Signed by Paula Compton MD on 04/08/2011 11:56:59 AM

## 2011-04-08 NOTE — Assessment & Plan Note (Signed)
Likely due to Oxybutynin.  Will stop.  No signs of infection and is on Avelox.  If persists will investigate

## 2011-04-08 NOTE — Assessment & Plan Note (Signed)
Well controlled currently.  If stays low may titrate down her medications

## 2011-04-08 NOTE — Patient Instructions (Addendum)
Stop the oxybutynin to see if your urination improves.   If it gets worse or you have fever or pain call us  Finish all the Avelox antibiotic and then stop it  New diagnoses - Weight loss, Gall Stones (cholelithiasis)

## 2011-04-08 NOTE — Assessment & Plan Note (Signed)
Diagnosed in hospital.  Doing well.  Finish course of abx

## 2011-04-09 NOTE — Discharge Summary (Signed)
NAMEBLYTHE, VEACH NO.:  [id number]  MEDICAL RECORD NO.:  [id number]  LOCATION:  [facility]                         FACILITY:  [facility]  PHYSICIAN:  Leighton Roach Millette Halberstam, M.D.DATE OF BIRTH:  Sep 09, 1929  DATE OF ADMISSION:  04/02/2011 DATE OF DISCHARGE:  04/06/2011                              DISCHARGE SUMMARY   PRIMARY CARE PHYSICIAN:  Pearlean Brownie, MD, [facility].  CONSULTS:  None.  REASON FOR ADMISSION:  Failure to thrive and dehydration.  PRIMARY DISCHARGE DIAGNOSIS:  Failure to thrive and dehydration.  SECONDARY DISCHARGE DIAGNOSES: 1. Diastolic congestive heart failure and she has an ejection fraction     of 40-45% on echo in July 2012. 2. Chronic kidney disease, stage IV. 3. Hypertension. 4. Resolved Klebsiella pneumonia/sepsis.  DISCHARGE MEDICATIONS:   New medications: 1. Furosemide 180 mg 1/2 tablet daily. 2. Avelox 400 mg 1 tablet daily.  Continued medications: 1. Amlodipine 10 mg 1 tablet daily. 2. Aspirin 81 mg 1 tablet daily. 3. Calcitriol 0.25 mcg 1 capsule daily. 4. Clonidine patch 0.2 mg/24 hours, 1 patch weekly. 5. Metoprolol XL 100 mg 1 tablet daily. 6. Oxybutynin 5 mg 1/2 tablet twice daily. 7. Sensipar 60 mg 1 tablet daily.  Discontinued medications: 1. Furosemide 80 mg 1 tablet daily.  HOSPITAL COURSE:  Ms. Lechuga is a 77 year old woman with a history of hypertension, chronic kidney disease stage IV receiving hemodialysis and recent hospitalization for Klebsiella pneumonia and sepsis returning to the hospital with 2 days of poor p.o. intake and dehydration. 1. Failure to thrive and dehydration.  Ms. Madura gives a history of 2     days without eating.  It is unclear why this occurred, but she does     have limited social support at home. With poor intake, she became     dehydrated and weak, though her caregiver brought her to the     emergency room.  Her creatinine was elevated.  Her mental status   was decreased and she appeared slightly dry.  She was started on IV     fluids and returned to her baseline mental status and creatinine     within 24 hours.  Given her recent bounce back, there was concern     about sending her home again.  Physical Therapy was consulted and     recommended home with 24-hour supervision or skilled nursing     facility.  The patient is adamantly opposed to going to a nursing     home.  A mini-mental status exam done while in the hospital scored     15/30 raising concern for Ms. Castell capacity to make decisions.     However, her caregiver Sherry Ruffing was able to contact the patient's     son-in-law who agreed to stay with her and provide assistance at     home for the time being.  With this added to the 2 hours of daily     care giving provided by [facility], it was felt to be safe to     discharge her home.  The importance of eating and drinking as well     as getting out  of bed was stressed to the patient, her son-in-law,     and [name]. 2. Diastolic congestive heart failure.  Her Lasix was held due to     dehydration.  She was monitored for signs of fluid overload during     the hospitalization but none were noted.  As she returned to the     hospital with dehydration, we discharged her on half of her normal     Lasix dose. 3. Chronic kidney disease, stage IV.  Her creatinine was elevated     above her baseline at admission secondary to dehydration.  Her     creatinine rapidly returned to baseline with the administration of     IV fluids.  This was being managed medically as she is refusing     dialysis, so we continued her home calcitriol and Sensipar. 4. Hypertension.  This is well controlled with home medications.  We     held her amlodipine for concern of developing edema with IV fluids.     The clonidine and metoprolol were continued throughout the     hospitalization and she was restarted on her amlodipine at     discharge. 5. Resolved  Klebsiella pneumonia/sepsis.  We continued her Avelox.     She will need a total of a 14-day course for an end date of April 14, 2011.  She may not have been taking the Avelox between her     discharge on July 3 and her readmission on April 02, 2011.  It was     reiterated that it is very important for her to complete the entire     course of antibiotics.  CONDITION ON DISCHARGE:  Stable.  PENDING TESTS:  None.  DISPOSITION:  Home with son-in-law and 2 hours of caregiving daily.  DISCHARGE FOLLOWUP:  With her primary care physician, Dr. Deirdre Priest at the [facility] on the afternoon of April 08, 2011.  FOLLOWUP ISSUES: 1. Please address how things are going at home, make sure the son-in-     law feels comfortable with the level of care that Ms. Helwig needs. 2. Please make sure she is taking her antibiotic as prescribed. 3. It would be a good idea to address Ms. Sakamoto capacity at this     time given that she had an MMSE of 15 in the hospital.  Also     discussed future placement if it is needed and needing a power of     attorney. 4. Adjust her Lasix dose if needed since we changed it on discharge.    ______________________________ Despina Hick, MD   ______________________________ Leighton Roach Tangi Shroff, M.D.    EB/MEDQ  D:  04/07/2011  T:  04/08/2011  Job:  [id number]  cc:   Pearlean Brownie, M.D.  Electronically Signed by Despina Hick MD on 04/08/2011 05:38:52 PM Electronically Signed by Acquanetta Belling M.D. on 04/09/2011 05:07:30 PM

## 2011-04-15 ENCOUNTER — Emergency Department (HOSPITAL_COMMUNITY): Payer: Medicare Other

## 2011-04-15 ENCOUNTER — Observation Stay (HOSPITAL_COMMUNITY)
Admission: EM | Admit: 2011-04-15 | Discharge: 2011-04-16 | Disposition: A | Discharge status: Home / Self Care | Payer: Medicare Other | Attending: Family Medicine | Admitting: Family Medicine

## 2011-04-15 ENCOUNTER — Observation Stay (HOSPITAL_COMMUNITY): Payer: Medicare Other

## 2011-04-15 ENCOUNTER — Encounter: Payer: Self-pay | Admitting: Family Medicine

## 2011-04-15 DIAGNOSIS — N184 Chronic kidney disease, stage 4 (severe): Secondary | ICD-10-CM | POA: Insufficient documentation

## 2011-04-15 DIAGNOSIS — K5730 Diverticulosis of large intestine without perforation or abscess without bleeding: Secondary | ICD-10-CM | POA: Insufficient documentation

## 2011-04-15 DIAGNOSIS — Z79899 Other long term (current) drug therapy: Secondary | ICD-10-CM | POA: Insufficient documentation

## 2011-04-15 DIAGNOSIS — K8020 Calculus of gallbladder without cholecystitis without obstruction: Principal | ICD-10-CM | POA: Insufficient documentation

## 2011-04-15 DIAGNOSIS — J449 Chronic obstructive pulmonary disease, unspecified: Secondary | ICD-10-CM | POA: Insufficient documentation

## 2011-04-15 DIAGNOSIS — J4489 Other specified chronic obstructive pulmonary disease: Secondary | ICD-10-CM | POA: Insufficient documentation

## 2011-04-15 DIAGNOSIS — Z7982 Long term (current) use of aspirin: Secondary | ICD-10-CM | POA: Insufficient documentation

## 2011-04-15 DIAGNOSIS — M1990 Unspecified osteoarthritis, unspecified site: Secondary | ICD-10-CM | POA: Insufficient documentation

## 2011-04-15 DIAGNOSIS — Z8673 Personal history of transient ischemic attack (TIA), and cerebral infarction without residual deficits: Secondary | ICD-10-CM | POA: Insufficient documentation

## 2011-04-15 DIAGNOSIS — I509 Heart failure, unspecified: Secondary | ICD-10-CM | POA: Insufficient documentation

## 2011-04-15 DIAGNOSIS — I129 Hypertensive chronic kidney disease with stage 1 through stage 4 chronic kidney disease, or unspecified chronic kidney disease: Secondary | ICD-10-CM | POA: Insufficient documentation

## 2011-04-15 DIAGNOSIS — R109 Unspecified abdominal pain: Secondary | ICD-10-CM

## 2011-04-15 DIAGNOSIS — N186 End stage renal disease: Secondary | ICD-10-CM

## 2011-04-15 DIAGNOSIS — Z96659 Presence of unspecified artificial knee joint: Secondary | ICD-10-CM | POA: Insufficient documentation

## 2011-04-15 LAB — DIFFERENTIAL
Basophils Absolute: 0 10*3/uL (ref 0.0–0.1)
Lymphocytes Relative: 24 % (ref 12–46)
Lymphs Abs: 1.6 10*3/uL (ref 0.7–4.0)
Neutro Abs: 3.7 10*3/uL (ref 1.7–7.7)
Neutrophils Relative %: 58 % (ref 43–77)

## 2011-04-15 LAB — CBC
HCT: 27.4 % — ABNORMAL LOW (ref 36.0–46.0)
Hemoglobin: 8.9 g/dL — ABNORMAL LOW (ref 12.0–15.0)
MCV: 82.3 fL (ref 78.0–100.0)
RBC: 3.33 MIL/uL — ABNORMAL LOW (ref 3.87–5.11)
RDW: 16.3 % — ABNORMAL HIGH (ref 11.5–15.5)
WBC: 6.4 10*3/uL (ref 4.0–10.5)

## 2011-04-15 LAB — URINALYSIS, ROUTINE W REFLEX MICROSCOPIC
Glucose, UA: NEGATIVE mg/dL
Hgb urine dipstick: NEGATIVE
Specific Gravity, Urine: 1.018 (ref 1.005–1.030)
Urobilinogen, UA: 0.2 mg/dL (ref 0.0–1.0)
pH: 5.5 (ref 5.0–8.0)

## 2011-04-15 LAB — COMPREHENSIVE METABOLIC PANEL
AST: 18 U/L (ref 0–37)
Albumin: 2.7 g/dL — ABNORMAL LOW (ref 3.5–5.2)
Alkaline Phosphatase: 157 U/L — ABNORMAL HIGH (ref 39–117)
BUN: 29 mg/dL — ABNORMAL HIGH (ref 6–23)
Potassium: 4.2 mEq/L (ref 3.5–5.1)
Total Protein: 7.6 g/dL (ref 6.0–8.3)

## 2011-04-15 LAB — URINE MICROSCOPIC-ADD ON

## 2011-04-15 LAB — LIPASE, BLOOD: Lipase: 79 U/L — ABNORMAL HIGH (ref 11–59)

## 2011-04-15 NOTE — H&P (Signed)
Family Medicine Teaching [facility] Admission History and Physical  Patient name: Frances [name] Medical record number: [id number] Date of birth: 06-10-1929 Age: 77 y.o. Gender: female  Primary Care Provider: Carney Living, MD, MD  Chief Complaint: Abdominal Pain History of Present Illness: Frances [name] is a 77 y.o. year old female presenting with abdominal pain.  She was recently discharged from the hospital for dehydration/FTT in the setting of a resolving PNA.  During this hospitalization, she was found to have mildly elevated liver enzymes.  Workup included abdominal US and HIDA scan which showed some mild thickening of the gallbladder wall, stones, but no evidence of acute cholecystitis.  She now returns for abdominal pain that comes and goes.  It is present throughout the abdomen, does seem to be made worse by food, and is relieved by nothing.  There does not appear to be any particular event that prompted her to come the the ED today.  She has had slightly decreased PO but good UOP and normal BMs.  No fevers/chills or problems breathing.  Patient Active Problem List  Diagnoses  . HYPERTENSION, BENIGN SYSTEMIC  . EDEMA-LEGS,DUE TO VENOUS OBSTRUCT.  Marland Kitchen CHRONIC KIDNEY DISEASE STAGE IV (SEVERE)  . OSTEOARTHRITIS, LOWER LEG  . WEIGHT LOSS  . URINARY INCONTINENCE  . Urinary hesitancy  . Pneumonia  . Health care home, active care coordination   Past Medical History: Past Medical History  Diagnosis Date  . CAD (coronary artery disease) 2005    Last cath in 2005 - no significant disease  . Normal echocardiogram 2009    mild findings EJ fx 57     Past Surgical History: No past surgical history on file.  Social History: History   Social History  . Marital Status: Widowed    Spouse Name: N/A    Number of Children: N/A  . Years of Education: N/A   Social History Main Topics  . Smoking status: Never Smoker   . Smokeless tobacco: Not on file  . Alcohol Use: Not on  file  . Drug Use: Not on file  . Sexually Active: Not on file   Other Topics Concern  . Not on file   Social History Narrative  . No narrative on file    Family History: No family history on file.  Allergies: No Known Allergies  Current Outpatient Prescriptions  Medication Sig Dispense Refill  . amLODipine (NORVASC) 10 MG tablet Take 10 mg by mouth daily.        Marland Kitchen aspirin 81 MG tablet Take 1 tablet (81 mg total) by mouth daily.  90 tablet  3  . calcitRIOL (ROCALTROL) 0.25 MCG capsule Take 0.25 mcg by mouth daily.        . cinacalcet (SENSIPAR) 60 MG tablet Take 60 mg by mouth daily. per Dr Briant Cedar       . cloNIDine (CATAPRES - DOSED IN MG/24 HR) 0.2 mg/24hr patch Place 1 patch onto the skin once a week.        . furosemide (LASIX) 80 MG tablet Take 40 mg by mouth daily.       . metoprolol (TOPROL-XL) 100 MG 24 hr tablet Take 1 tablet (100 mg total) by mouth daily.  31 tablet  11  . moxifloxacin (AVELOX) 400 MG tablet Take 400 mg by mouth daily.        . traMADol (ULTRAM) 50 MG tablet Take 50 mg by mouth 2 (two) times daily as needed.  Review Of Systems: Per HPI with the following additions: No rashes, chest pain, leg swelling. Otherwise 12 point review of systems was performed and was unremarkable.  Physical Exam: Pulse: 67  Blood Pressure: 124/72 RR: 20   O2: 94 on RA Temp: 98.2  General: alert, cooperative and no distress HEENT: PERRLA and extra ocular movement intact Heart: S1, S2 normal, no murmur, rub or gallop, regular rate and rhythm Lungs: unlabored breathing and occasional scattered crackles Abdomen: mild tenderness in the in the RUQ., no rebound tenderness, no guarding or rigidity Extremities: extremities normal, atraumatic, no cyanosis or edema Skin:no rashes, no ecchymoses Neurology: normal without focal findings and mental status, speech normal, alert and oriented x3  Labs and Imaging: Lab Results  Component Value Date/Time   NA 142 04/15/2011  9:10  AM   K 4.2 04/15/2011  9:10 AM   CL 110 04/15/2011  9:10 AM   CO2 20 04/15/2011  9:10 AM   BUN 29* 04/15/2011  9:10 AM   CREATININE 3.06* 04/15/2011  9:10 AM   GLUCOSE 100* 04/15/2011  9:10 AM   Lab Results  Component Value Date   WBC 6.4 04/15/2011   HGB 8.9* 04/15/2011   HCT 27.4* 04/15/2011   MCV 82.3 04/15/2011   PLT 332 04/15/2011   AST 18, ALT 16, AP 157 UA: unremarkable AXR: No acute findings CT ABD/PELVIS: gallstones with likely pericholecystic fluid and wall thickening, bilateral renal cysts, and coarse calcifications in spleen.   Assessment and Plan: Frances [name] is a 77 y.o. year old female presenting with intermittent abdominal pain. 1. Abdominal pain: per discussions with surgery, it is not felt likely to be acute cholecystitis, but will await their final decision.  Will place in obs status, start on high dose PPI and Bentyl for now in addition to her home medications.  Discussed with the patient that we may very well not be able to make her abdominal pain any better and, if further consideration by surgery results in the decision that this is decidedly not her gallbladder, that she may have to go home with this.  Pt is addiment that she does not want placement in a nursing home. 2. History of PNA: Will obtain a CXR to  3. HTN: Will continue her home medications for this issue. 4. CKD: Pt is almost end stage but is addiment in her refusal of dialysis.  Currently she is slightly improved from her baseline. 5. FEN/GI: ad lib 6. Prophylaxis: SQ heparin, high dose PPI 7. Disposition: if surgery does not plan a procedure, will plan home vs placement decision tomorrow afternoon.

## 2011-04-16 LAB — COMPREHENSIVE METABOLIC PANEL
Alkaline Phosphatase: 157 U/L — ABNORMAL HIGH (ref 39–117)
BUN: 27 mg/dL — ABNORMAL HIGH (ref 6–23)
Chloride: 107 mEq/L (ref 96–112)
GFR calc Af Amer: 24 mL/min — ABNORMAL LOW (ref >60)
GFR calc non Af Amer: 20 mL/min — ABNORMAL LOW (ref >60)
Glucose, Bld: 72 mg/dL (ref 70–99)
Potassium: 4.3 mEq/L (ref 3.5–5.1)
Total Bilirubin: 0.3 mg/dL (ref 0.3–1.2)

## 2011-04-16 LAB — CBC
HCT: 27.7 % — ABNORMAL LOW (ref 36.0–46.0)
Hemoglobin: 8.8 g/dL — ABNORMAL LOW (ref 12.0–15.0)
MCHC: 31.8 g/dL (ref 30.0–36.0)
MCV: 83.2 fL (ref 78.0–100.0)
WBC: 5.9 10*3/uL (ref 4.0–10.5)

## 2011-04-16 LAB — LIPASE, BLOOD: Lipase: 45 U/L (ref 11–59)

## 2011-04-27 NOTE — H&P (Signed)
Frances [name], Frances [name] NO.:  [id number]  MEDICAL RECORD NO.:  [id number]  LOCATION:  5127                         FACILITY:  [facility]  PHYSICIAN:  Nestor Ramp, MD        DATE OF BIRTH:  09-04-1929  DATE OF ADMISSION:  04/15/2011 DATE OF DISCHARGE:                             HISTORY & PHYSICAL   PRIMARY CARE PROVIDER:  Pearlean Brownie, MD at [facility].  CHIEF COMPLAINT:  Abdominal pain.  HISTORY OF PRESENT ILLNESS:  Frances [name] is an 77 year old female presenting with abdominal pain.  She was recently discharged from the hospital for dehydration and failure to thrive in the setting of a resolving pneumonia.  During this hospitalization, she was found to have mildly elevated liver enzymes.  Workup included an ultrasound and HIDA scan which showed some mild thickening of gallbladder wall, stone, but no evidence of acute cholecystitis.  She now returns for abdominal pain that is colicky in nature.  It is present throughout her abdomen that seemed to be made worse by food and it is relieved by nothing.  There does not appear to be any particular event that prompted her to come to the emergency department today.  She had a slight decrease in her p.o. intake, but good urine output and normal bowel movements.  No fevers, chills, or problems breathing.  PAST MEDICAL HISTORY: 1. Hypertension. 2. Bilateral lower extremity edema. 3. Chronic kidney disease, stage IV. 4. Osteoarthritis. 5. Failure to thrive. 6. Recent Klebsiella pneumoniae with sepsis.  SOCIAL HISTORY:  The patient is widowed, does not smoke, does not use alcohol or illicit drugs.  FAMILY HISTORY:  Family history is noncontributory.  ALLERGIES:  No known drug allergies.  MEDICATIONS: 1. Norvasc 10 mg by mouth daily. 2. Aspirin 81 mg by mouth daily. 3. Calcitriol 0.25 mcg by mouth daily. 4. Sensipar 60 mg by mouth daily. 5. Clonidine 0.2 mg per 24-hour patch. 6. Lasix 40 mg  by mouth daily. 7. Toprol-XL 100 mg by mouth daily. 8. Avelox 400 mg by mouth daily. 9. Tramadol 50 mg by mouth 2 times daily as needed.  REVIEW OF SYSTEMS:  Per HPI with following additions:  No chest pain, rashes, or leg swelling.  The rest 12-point review of systems was performed and was unremarkable.  PHYSICAL EXAMINATION:  VITAL SIGNS:  Pulse 67, blood pressure 124/72, respirations 20, O2 sat is 94% on room air, temperature is 98.2 degrees Fahrenheit. GENERAL:  Alert, cooperative, in no distress. HEENT:  Pupils are equal, round, and reactive to light.  Extraocular movements are intact. HEART:  S1 and S2 normal.  No murmurs, rubs, or gallops. LUNGS:  Unlabored breathing with occasional scattered crackles. ABDOMEN:  Mild tenderness in the right upper quadrant with no rebound or guarding.  Negative Murphy sign. EXTREMITIES:  Normal, atraumatic.  No cyanosis or edema. SKIN:  No rashes. NEUROLOGY:  Normal without focal findings.  Alert and oriented x3.  LABORATORY DATA:  BMET is remarkable for a BUN of 29, creatinine of 3.06.  CBC is remarkable for a hemoglobin of 8.9.  AST is 18, ALT is 16, alkaline phosphatase 157.  UA is  unremarkable.  Abdominal x-ray shows no acute findings.  CT of abdomen and pelvis shows gallstones with likely pericholecystic fluid or wall thickening, bilateral renal cysts, and coarse calcifications in the spleen  ASSESSMENT AND PLAN:  Frances [name] is an 77 year old female presenting with intermittent abdominal pain. 1. Abdominal pain.  Per discussions with Surgery, it is felt not     likely to be secondary to acute cholecystitis, but we will await     their final decision.  We will place the patient in opt status,     start her on a high-dose PPI and Bentyl for now in addition to her     home medications.  Discussed with the patient that we may very well     not able to make her abdominal pain any better and if further     consideration by Surgery reveals a  decision not to remove her     gallbladder, that she may have to go home and live with symptoms     similar to this.  The patient is adamant that she does not want     placement in a nursing home. 2. History of pneumonia:  We will obtain a chest x-ray to further     evaluate her response and resolution of this problem. 3. Hypertension:  We will continue her home medications for this     issue. 4. Chronic kidney disease.  The patient is on his end stage, but has     had no refusal with dialysis.  Currently, she is slightly improved     from her baseline. 5. Fluids, electrolytes, nutrition/gastrointestinal:  Ad lib. 6. Prophylaxis:  Subcu heparin and high-dose PPI. 7. Disposition:  If surgery does not plan a procedure, we will likely     plan home versus placement decision some time tomorrow afternoon.    ______________________________ Majel Homer, MD   ______________________________ Nestor Ramp, MD    ER/MEDQ  D:  04/16/2011  T:  04/16/2011  Job:  [id number]  Electronically Signed by Manuela Neptune MD on 04/18/2011 07:23:44 PM Electronically Signed by Denny Levy MD on 04/27/2011 11:20:02 AM
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
## 2011-04-29 ENCOUNTER — Ambulatory Visit (INDEPENDENT_AMBULATORY_CARE_PROVIDER_SITE_OTHER): Payer: Medicare Other | Admitting: Family Medicine

## 2011-04-29 ENCOUNTER — Encounter: Payer: Self-pay | Admitting: Family Medicine

## 2011-04-29 DIAGNOSIS — N184 Chronic kidney disease, stage 4 (severe): Secondary | ICD-10-CM

## 2011-04-29 DIAGNOSIS — R634 Abnormal weight loss: Secondary | ICD-10-CM

## 2011-04-29 DIAGNOSIS — I10 Essential (primary) hypertension: Secondary | ICD-10-CM

## 2011-04-29 DIAGNOSIS — K819 Cholecystitis, unspecified: Secondary | ICD-10-CM | POA: Insufficient documentation

## 2011-04-29 NOTE — Progress Notes (Signed)
  Subjective:    Patient ID: Frances [name], female    DOB: [birth year], 77 y.o.   MRN: [id number]  HPI  Followup from recent hospital visit for abdominal pain  Abdominal Pain Still has intermittently,  None now.  No nausea or vomiting.  Does not know that she has any pain medication and did not bring tramadol with her other meds.  Eating sporadically some better the last few days.  Does not have much of an appetite.  HYPERTENSION Disease Monitoring Home BP Monitoring not doing Chest pain- no     Dyspnea-  no  Medications Compliance: did not use her clonidine patch since yesterday.  Does not have her norvasc bottle with her other meds. Lightheadedness-  no  Edema-  yes, but unchanged from usual   ROS - See HPI  PMH Reviewed recent discharge summary  Lab Review   Potassium  Date Value Range Status  04/16/2011 4.3  3.5-5.1 (mEq/L) Final     Sodium  Date Value Range Status  04/16/2011 139  135-145 (mEq/L) Final     Renal Failure Continues to see Dr Briant Cedar  Review of Symptoms - see HPI  PMH - Smoking status noted.  Discharge summary reviewed   Review of Systems     Objective:   Physical Exam  Lungs:  Normal respiratory effort, chest expands symmetrically. Lungs are clear to auscultation, no crackles or wheezes. Heart - Regular rate and rhythm.  No murmurs, gallops or rubs.    Extremities:  No cyanosis, 1-2 + edema billaterally ,  Abdomen: soft and non-tender without masses, organomegaly or hernias noted.  No guarding or rebound        Assessment & Plan:   Subjective:       Assessment & Plan:

## 2011-04-29 NOTE — Assessment & Plan Note (Addendum)
Pain is intermittent. She does not think she has any analgesics at home.  Will contact her home aide.  Spoke with Dellia Cloud.  She will let her know to use tramadol prn pain

## 2011-04-29 NOTE — Assessment & Plan Note (Signed)
Lab Results  Component Value Date   NA 139 04/16/2011   K 4.3 04/16/2011   CL 107 04/16/2011   CO2 20 04/16/2011   BUN 27* 04/16/2011   CREATININE 2.35* 04/16/2011    BP Readings from Last 3 Encounters:  04/29/11 160/60  04/08/11 122/62  05/26/10 137/69    Assessment: Hypertension control:  mildly elevated  Progress toward goals:  deteriorated Barriers to meeting goals:  transportation problems, nonadherence to medications and adverse effects of medications.  Unsure if she is taking all her medications.  Will try to contact her home care aide  Plan: Hypertension treatment:  continue current medications

## 2011-04-29 NOTE — Patient Instructions (Signed)
I will call Drinda Butts and discuss your pain medication and you weight and eating  Keep taking all the medicine on your list including your patch  Call me if you have bad pain or need to talk with me after hours call [phone] and leave a message

## 2011-04-29 NOTE — Assessment & Plan Note (Signed)
Worsening.  Likely due to renal failure with decreased appetite.  She is not interested in dialysis or any prevention testing

## 2011-04-30 ENCOUNTER — Ambulatory Visit (HOSPITAL_COMMUNITY): Payer: Medicare Other | Attending: Nephrology

## 2011-04-30 ENCOUNTER — Other Ambulatory Visit: Payer: Self-pay | Admitting: Family Medicine

## 2011-04-30 DIAGNOSIS — N189 Chronic kidney disease, unspecified: Secondary | ICD-10-CM | POA: Insufficient documentation

## 2011-04-30 MED ORDER — OMEPRAZOLE 20 MG PO TBEC: 20.0000 mg | DELAYED_RELEASE_TABLET | Freq: Two times a day (BID) | ORAL | Status: DC

## 2011-04-30 NOTE — H&P (Signed)
Frances [name], Frances [name] NO.:  [id number]  MEDICAL RECORD NO.:  [id number]  LOCATION:  2603                         FACILITY:  [facility]  PHYSICIAN:  Cavan Bearden A. Sheffield Slider, M.D.    DATE OF BIRTH:  Jul 22, 1929  DATE OF ADMISSION:  03/26/2011 DATE OF DISCHARGE:                             HISTORY & PHYSICAL   CHIEF COMPLAINT:  Shortness of breath.  HISTORY OF PRESENT ILLNESS:  Frances [name] is a 77 year old female who presents with shortness of breath and weakness.  Per her home health worker, she has been less active and having worsening shortness of breath for the past week.  She has been refusing to go to the hospital; however, yesterday, her shortness of breath worsened and she developed nausea and weakness.  Her home health worker at that point was able to convince her to go to the [facility] ED for further workup and management. Upon arrival to the emergency room, she was tachycardic, hypertensive, and tachypneic.  The blood pressure and the majority of the tachycardia responded well to a 500 mL bolus of normal saline.  Her tachypnea responded well to 3 liters of oxygen per nasal cannula and she is currently satting well.  She denies any fever, chills, abdominal pain, dysuria, continued nausea.  She is hungry and thirsty and would like to eat.  She feels well currently.  She continued to decline dialysis and does want a DNR/DNI status.  PAST MEDICAL HISTORY: 1. Hypertension. 2. CKD, stage IV. 3. Osteoarthritis. 4. Shoulder pain. 5. Weight loss. 6. Urinary incontinence.  PAST SURGICAL HISTORY:  Knee replacement and what is likely a total abdominal hysterectomy due to cervical cancer in 1975.  SOCIAL HISTORY:  Lives alone and has a Research scientist (life sciences), Sherry Ruffing, who helps her.  Frances [name]'s phone number is [phone].  She is basically her only contact.  Frances [name] does have a son, however, he is a resident of a prison in [place].  She has no health care power of attorney,  no advanced directives, is currently widowed.  Unknown smoking, drinking status.  FAMILY HISTORY:  Unknown.  ALLERGIES:  No known drug allergies.  MEDICATIONS: 1. Aspirin 81 daily. 2. Sensipar 60 mg daily. 3. Lasix 80 mg twice a day. 4. Metoprolol tartrate 100 mg daily. 5. Amlodipine 10 mg daily. 6. Oxybutynin 2.5 mg twice a day. 7. Clonidine patch 0.2 daily. 8. Calcitriol 0.25 mcg daily.  REVIEW OF SYSTEMS:  Please see HPI.  PHYSICAL EXAMINATION:  VITAL SIGNS:  Heart rate 110, blood pressure 118/50, respiratory rate 26, satting 98% on 3 liters, temperature 97.7. GENERAL:  Alert, cooperative, conversant, difficult to understand. HEENT:  Pupils equal, round, reactive to light.  She has no teeth but tacky mucous membranes. HEART:  Tachycardic but normal rhythm.  She has a systolic murmur at her right upper sternal border that radiates to her carotids. LUNGS:  She is tachypneic but no increased work of breathing.  Crackles in the right base.  No wheezes noted. ABDOMEN:  Soft, nontender, nondistended.  No masses.  No pain. EXTREMITIES:  Nonedematous.  Brisk capillary refill. NEUROLOGIC:  Alert and oriented x3 and is appropriate.  LABORATORY DATA  AND STUDIES:  Basic metabolic panel significant for a BUN of 38 and creatinine of 3.5, glucose of 138.  Liver enzymes significant for alk phos of 183, otherwise normal.  CBC:  White count of 11.4, hemoglobin 13, platelets 194.  Cardiac enzymes are negative. Procalcitonin is 55, lactate is 4.3 Pro natriuretic BNP is 3417. Chest x-ray shows mild vascular congestion with bibasilar atelectasis. Urinalysis shows small leukocyte esterase with 0-2 white cells. EKG shows a rate of 123, sinus tachycardia, ST depression in lead V5 with T-wave inversion in leads IV through VI.  This is change from her prior EKG.  ASSESSMENT AND PLAN:  Frances [name] is a 77 year old female with tachycardia, tachypnea, and hypotension. 1. Tachycardia and  hypotension.  Both responded moderately to a small     500 mL fluid bolus.  EKG just showed some T-wave inversions and one     lead with ST depression.  However, her cardiac enzymes were     negative.  My differential diagnosis at this time is: 2. Systemic inflammatory response syndrome due to pneumonia. 3. Pulmonary embolism. 4. Myocardial infarction. Plan obtaining a V/Q scan at this time to evaluate for pulmonary embolus.  I am empirically treating with ceftriaxone and azithromycin for a presumed pneumonia.  I am also cycling her cardiac enzymes for myocardial infarction workup.  My plan is to observe her in step-down and monitor for arrhythmia, avoid too much fluid resuscitation as I want to avoid volume overload as her BNP is elevated.  I am holding Lasix for now as well as her clonidine and amlodipine.  Likely restarting these medications in the morning. 1. Infectious Disease.  Suspect systemic inflammatory response     syndrome; however, chest x-ray does not show pneumonia or volume     overload.  I think her crackles on exam will likely become a     pneumonia tomorrow, we will repeat a chest x-ray.  Urine and blood     cultures have been obtained.  Treating with ceftriaxone and     azithromycin and follow in the morning.  When other diagnosis     becomes more apparent, we will discontinue the antibiotics and     follow. 2. Chronic kidney disease, stage IV.  Has refused dialysis in the past     and again refuses it today.  We will follow creatinine tomorrow and     consult Renal if needed. 3. Hypertension.  Provided metoprolol at this time but holding     clonidine patch and amlodipine.  We will follow. 4. Fluids, electrolytes, and nutrition/gastrointestinal.  Passed a     bedside swallow study.  We will allow a normal diet. 5. Code status.  Do not resuscitate/do not intubate.  I discussed with     the patient who is alert and oriented and     competent to make decisions by my  assessment. 6. Prophylaxis.  We will use heparin. 7. Disposition.  Pending PT, OT evaluation and resolution of her     symptoms.     Clementeen Graham, MD   ______________________________ Arnette Norris Sheffield Slider, M.D.    EC/MEDQ  D:  03/26/2011  T:  03/27/2011  Job:  [id number]  Electronically Signed by Clementeen Graham  on 04/30/2011 02:28:12 PM Electronically Signed by Zachery Dauer M.D. on 04/30/2011 03:39:26 PM

## 2011-05-11 NOTE — Discharge Summary (Signed)
  NAMEJULANNE, [name] NO.:  [id number]  MEDICAL RECORD NO.:  [id number]  LOCATION:  5127                         FACILITY:  [facility]  PHYSICIAN:  Nestor Ramp, MD        DATE OF BIRTH:  29-May-1929  DATE OF ADMISSION:  04/15/2011 DATE OF DISCHARGE:  04/16/2011                              DISCHARGE SUMMARY   DISCHARGE DIAGNOSES: 1. Gallstones with possible cholecystitis. 2. Chronic kidney disease. 3. Congestive heart failure to 45% ejection fraction. 4. Hypertension.  DISCHARGE MEDICATIONS: 1. Dicyclomine 20 mg one tablet p.o. q.i.d. 2. Pantoprazole 40 mg p.o. b.i.d. 3. Amlodipine 10 mg one tablet p.o. daily. 4. Aspirin 81 mg one tablet p.o. daily. 5. Calcitrol 0.25 mcg one capsule p.o. daily. 6. Clonidine patch 0.2 mg per 24 hours apply one patch on Saturdays. 7. Furosemide 80 mg half a tablet p.o. daily. 8. Metoprolol XL 100 mg one tab p.o. daily. 9. Oxybutynin 5 mg half a tablet p.o. b.i.d. 10.Sensipar 60 mg one tablet p.o. daily.  LABORATORY DATA:  Creatinine 2.35, BUN 27, calcium 6.8, albumin 2.6, hemoglobin 8.8, lipase 45, white blood count 5.9.  PERTINENT STUDIES: 1. CT scan of abdomen that showed gallstones would likely     pericholecystic fluid and wall thickening.  Bilateral renal cysts.     Coarse calcification of spleen consistent with old infection or     injury. 2. An x-ray of abdomen with no acute finding. 3. Chest x-ray with mild CHF, cardiomegaly, and pulmonary venous     hypertension.  Possible minimal interstitial edema.  BRIEF HOSPITAL COURSE:  This is an 77 year old woman with history of chronic renal disease, that refuses dialysis, that comes in with abdominal pain. 1. Gallstones from possible cholecystitis.  The patient has a CT     positive for these entities, but refuses any procedures or     treatment.  Health care giver, [name] is her power of     attorney and was discussed with her and the patient the goals of  care.  The patient admitted at this time for comfort and placement.     Her epigastric pain resolved after analgesics.  [name] refuses     to go to a skilled nursing facility, so she is discharged home     with comfort care. 2. CHF exacerbation.  The patient was negative at physical exam for     crackles or any other signs of fluid overload.  She is on     furosemide 40 mg daily.  Continue same treatment at home.  DISCHARGE INSTRUCTIONS:  Return to daily activities slowly.  Renal diet. An appointment with Dr. Deirdre Priest on April 29, 2011.  The patient is discharged home on stable medical condition.    ______________________________ Wayne Both, MD   ______________________________ Nestor Ramp, MD    DP/MEDQ  D:  04/19/2011  T:  04/19/2011  Job:  [id number]  Electronically Signed by Lillia Abed DE LA PAZ  on 05/09/2011 01:19:06 AM Electronically Signed by Denny Levy MD on 05/11/2011 11:22:25 AM

## 2011-05-15 ENCOUNTER — Encounter: Payer: Self-pay | Admitting: Family Medicine

## 2011-05-28 ENCOUNTER — Telehealth: Payer: Self-pay | Admitting: Family Medicine

## 2011-05-28 NOTE — Telephone Encounter (Signed)
LMOVM for sandy Long to give me a call back [employer], [employer]

## 2011-05-28 NOTE — Telephone Encounter (Signed)
Is calling about the FL2 form and needs to speak with someone about the date.  Please give her a call.

## 2011-05-29 NOTE — Telephone Encounter (Signed)
Year needs to be added to [facility], asked that [place] fax FL2 to [place] for [place] to add information and we would fax it back to her at [phone] Attn: Delorise Jackson

## 2011-06-26 LAB — CARDIAC PANEL(CRET KIN+CKTOT+MB+TROPI)
CK, MB: 1.6
CK, MB: 1.6
Relative Index: INVALID
Relative Index: INVALID
Total CK: 58
Troponin I: 0.02
Troponin I: 0.03

## 2011-06-26 LAB — TSH: TSH: 3.895

## 2011-06-26 LAB — BASIC METABOLIC PANEL
BUN: 16
CO2: 26
Calcium: 9.1
Calcium: 9.4
Chloride: 107
Creatinine, Ser: 2.29 — ABNORMAL HIGH
Creatinine, Ser: 2.65 — ABNORMAL HIGH
GFR calc Af Amer: 21 — ABNORMAL LOW
GFR calc non Af Amer: 17 — ABNORMAL LOW
Sodium: 141

## 2011-06-26 LAB — LIPID PANEL
Cholesterol: 211 — ABNORMAL HIGH
Total CHOL/HDL Ratio: 2.5
VLDL: 17

## 2011-06-26 LAB — CBC
Hemoglobin: 10.9 — ABNORMAL LOW
MCHC: 32
RBC: 4.01
WBC: 5.8

## 2011-08-10 ENCOUNTER — Other Ambulatory Visit (HOSPITAL_COMMUNITY): Payer: Self-pay | Admitting: *Deleted

## 2011-08-12 ENCOUNTER — Encounter (HOSPITAL_COMMUNITY)
Admission: RE | Admit: 2011-08-12 | Discharge: 2011-08-12 | Disposition: A | Discharge status: Home / Self Care | Payer: Medicare Other | Source: Ambulatory Visit | Attending: Nephrology | Admitting: Nephrology

## 2011-08-12 DIAGNOSIS — D509 Iron deficiency anemia, unspecified: Secondary | ICD-10-CM | POA: Insufficient documentation

## 2011-08-12 MED ORDER — FERUMOXYTOL INJECTION 510 MG/17 ML
510.0000 mg | INTRAVENOUS | Status: DC
  Administered 2011-08-12: 510 mg via INTRAVENOUS

## 2011-08-12 MED ORDER — FERUMOXYTOL INJECTION 510 MG/17 ML
INTRAVENOUS | Status: AC
  Filled 2011-08-12: qty 17

## 2011-08-19 ENCOUNTER — Encounter (HOSPITAL_COMMUNITY)
Admission: RE | Admit: 2011-08-19 | Discharge: 2011-08-19 | Disposition: A | Discharge status: Home / Self Care | Payer: Medicare Other | Source: Ambulatory Visit | Attending: Nephrology | Admitting: Nephrology

## 2011-08-19 MED ORDER — FERUMOXYTOL INJECTION 510 MG/17 ML
INTRAVENOUS | Status: AC
  Filled 2011-08-19: qty 17

## 2011-08-19 MED ORDER — FERUMOXYTOL INJECTION 510 MG/17 ML
510.0000 mg | INTRAVENOUS | Status: AC
  Administered 2011-08-19: 510 mg via INTRAVENOUS

## 2011-09-09 ENCOUNTER — Encounter: Payer: Self-pay | Admitting: Family Medicine

## 2011-09-09 ENCOUNTER — Ambulatory Visit (INDEPENDENT_AMBULATORY_CARE_PROVIDER_SITE_OTHER): Payer: Medicare Other | Admitting: Family Medicine

## 2011-09-09 DIAGNOSIS — M1990 Unspecified osteoarthritis, unspecified site: Secondary | ICD-10-CM

## 2011-09-09 DIAGNOSIS — R634 Abnormal weight loss: Secondary | ICD-10-CM

## 2011-09-09 DIAGNOSIS — I10 Essential (primary) hypertension: Secondary | ICD-10-CM

## 2011-09-09 MED ORDER — AMLODIPINE BESYLATE 5 MG PO TABS: 5.0000 mg | ORAL_TABLET | Freq: Every day | ORAL | Status: DC

## 2011-09-09 MED ORDER — TRAMADOL HCL 50 MG PO TABS: 50.0000 mg | ORAL_TABLET | Freq: Two times a day (BID) | ORAL | Status: DC | PRN

## 2011-09-09 NOTE — Progress Notes (Signed)
  Subjective:    Patient ID: Frances [name], female    DOB: 06/10/1929, 77 y.o.   MRN: [id number]  HPI  HYPERTENSION Disease Monitoring Home BP Monitoring not doing Chest pain- no     Dyspnea-  no  Medications Compliance: has all her medications except amlodipine which is on her list but she does not have. Lightheadedness-  no  Edema-  Mild unchanged   ROS - See HPI  PMH Lab Review   Potassium  Date Value Range Status  04/16/2011 4.3  3.5-5.1 (mEq/L) Final     Sodium  Date Value Range Status  04/16/2011 139  135-145 (mEq/L) Final       Back pain intermittent and positional.  No radiation is better with rest.  No fever or weight loss.  She does not have any more tramadol but thinks that did help  Review of Symptoms - see HPI  PMH - Smoking status noted.       Review of Systems     Objective:   Physical Exam        Assessment & Plan:

## 2011-09-09 NOTE — Patient Instructions (Signed)
Use the tramadol as needed for any pain  We are starting back the amlodipine 5 mg for your blood pressure

## 2011-09-09 NOTE — Assessment & Plan Note (Signed)
Stable at around 140

## 2011-09-09 NOTE — Assessment & Plan Note (Signed)
Likely cause of back pain.  No red flags.  Will try tramadol which worked for her in the past

## 2011-09-09 NOTE — Assessment & Plan Note (Signed)
Not controlled likely due to being off amlodipine.  Was on 10 mg but given age and gait instability will restart at 5 mg and follow

## 2011-10-14 ENCOUNTER — Ambulatory Visit (INDEPENDENT_AMBULATORY_CARE_PROVIDER_SITE_OTHER): Payer: Medicare Other | Admitting: Family Medicine

## 2011-10-14 ENCOUNTER — Encounter: Payer: Self-pay | Admitting: Family Medicine

## 2011-10-14 DIAGNOSIS — I10 Essential (primary) hypertension: Secondary | ICD-10-CM

## 2011-10-14 MED ORDER — CLONIDINE HCL 0.2 MG PO TABS: 0.2000 mg | ORAL_TABLET | Freq: Two times a day (BID) | ORAL | Status: DC

## 2011-10-14 NOTE — Patient Instructions (Signed)
Increase you Clonidine to 0.2 mg twice daily  Take the 0.1 mg tablets 2 twice daily until you run out then I sent in a new prescription for the 0.2 mg tablets take 1 of these twice daily  Call if you feel lightheadness  Call your insurance to see how you can get the Shingles shot  Your heart doctor will look at your blood pressure in Feb  Come back and see me in 2 months in March

## 2011-10-14 NOTE — Assessment & Plan Note (Signed)
Not well controlled despite adding low dose amlodipine.  Will increase her clonidine.  Goal is to keep systolic < 160 without lowering diastolic too much or causing lightheadness which she had in the past.

## 2011-10-14 NOTE — Progress Notes (Signed)
  Subjective:    Patient ID: Frances [name], female    DOB: Jan 28, 1929, 78 y.o.   MRN: [id number]  HPI  HYPERTENSION Disease Monitoring Home BP Monitoring not doing Chest pain- no     Dyspnea-  no  Medications Compliance: taking as prescribed brought all her bottles in.  Started amlodipine as ordered. Lightheadedness-  no  Edema-  Mild her usual   ROS - See HPI  PMH Lab Review   Potassium  Date Value Range Status  04/16/2011 4.3  3.5-5.1 (mEq/L) Final     Sodium  Date Value Range Status  04/16/2011 139  135-145 (mEq/L) Final       Review of Symptoms - see HPI  PMH - Smoking status noted.        Review of Systems     Objective:   Physical Exam  Alert no acute distress Heart - Regular rate and rhythm.  No murmurs, gallops or rubs.    Lungs:  Normal respiratory effort, chest expands symmetrically. Lungs are clear to auscultation, no crackles or wheezes. Extremities:  Mild 1+ edema without skin breakdown        Assessment & Plan:

## 2011-11-25 ENCOUNTER — Encounter: Payer: Self-pay | Admitting: Family Medicine

## 2011-11-25 ENCOUNTER — Ambulatory Visit (INDEPENDENT_AMBULATORY_CARE_PROVIDER_SITE_OTHER): Payer: Medicare Other | Admitting: Family Medicine

## 2011-11-25 VITALS — BP 182/78 | HR 70 | Temp 98.0°F | Ht 60.0 in | Wt 136.0 lb

## 2011-11-25 DIAGNOSIS — I10 Essential (primary) hypertension: Secondary | ICD-10-CM

## 2011-11-25 NOTE — Progress Notes (Signed)
  Subjective:    Patient ID: Frances [name], female    DOB: Aug 08, 1929, 78 y.o.   MRN: [id number]  HPI  Lightheadness For the last week or so feels very dizzy when she stands.  Has almost fallen but has been careful.  No focal weakness or persistent visual changes or chest pain or shortness of breath .   She forgot to bring her medications today and did not take any this AM.  This AM she does not have any lightheadness when walking or standing like she did yesterday.    Her lasix was recently increased to 2 tabs a day and then she decreased to one daily.  She is unsure of what other medications she takes   Review of Symptoms - see HPI  PMH - Smoking status noted.  I reviewed Dr Idalia Needle note from February   Review of Systems     Objective:   Physical Exam  Alert no acute distress Able to rise and walk without symptosm Blood pressure noted Heart - Regular rate and rhythm.  No murmurs, gallops or rubs.    Lungs:  Normal respiratory effort, chest expands symmetrically. Lungs are clear to auscultation, no crackles or wheezes. Extremities:  No cyanosis, no edema, (which is very unusual for her)    Assessment & Plan:

## 2011-11-25 NOTE — Assessment & Plan Note (Signed)
I think her current lightheadness is due to volume depletion and or hypotension.  She did not have any symptoms this AM which corresponds with her not taking her medications today.  Also she has no leg edema which is very uncommon for her.     Will try to get her medication list and cut back on her diuretic and see her soon.    I do not see any signs of cardiac or pulmonary or CNS causes

## 2011-11-25 NOTE — Patient Instructions (Signed)
Ask Frances [name] to call our office [phone] and give [place] a list of your medications.  Take 1/2 of your fluid pill each day.  Come back next Monday

## 2011-11-26 ENCOUNTER — Telehealth: Payer: Self-pay | Admitting: Family Medicine

## 2011-11-26 NOTE — Telephone Encounter (Signed)
Clonidine 0.2 mg  one tab bid Metoprolol  100mg   one tab daily Calcitriol  0.25 mg  one capsule daily sensipar  60 mg  one tab daily with food  furosimide 80 mg  one tab bid Aspirin 81 mg  one tab daily

## 2011-11-30 ENCOUNTER — Ambulatory Visit (INDEPENDENT_AMBULATORY_CARE_PROVIDER_SITE_OTHER): Payer: Medicare Other | Admitting: Family Medicine

## 2011-11-30 ENCOUNTER — Encounter: Payer: Self-pay | Admitting: Family Medicine

## 2011-11-30 DIAGNOSIS — Z9181 History of falling: Secondary | ICD-10-CM

## 2011-11-30 DIAGNOSIS — I10 Essential (primary) hypertension: Secondary | ICD-10-CM

## 2011-11-30 NOTE — Assessment & Plan Note (Signed)
Her blood pressure is increased today and she does not have symptoms of fluid overload so continue on lasix per day.

## 2011-11-30 NOTE — Assessment & Plan Note (Signed)
Given her arthritic and blood pressure and renal failure problems is at high risk.  Will see if can get assessment and any aids

## 2011-11-30 NOTE — Progress Notes (Signed)
  Subjective:    Patient ID: Frances [name], female    DOB: April 26, 1929, 78 y.o.   MRN: [id number]  Hypertension    Lightheadness Seems better for the last few days.  Seems to have almost tripped in the shower and does not have bars (they fell down).  Does not have lightheadness now when stands.  Does not have any increased edema or shortness of breath,   Brings in all her medications today   Review of Systems      Objective:   Physical Exam   Alert no acute distress Able to rise and walk without symptosm Blood pressure noted Heart - Regular rate and rhythm.  No murmurs, gallops or rubs.    Lungs:  Normal respiratory effort, chest expands symmetrically. Lungs are clear to auscultation, no crackles or wheezes. Extremities:  No cyanosis, only trace edema    Assessment & Plan:

## 2011-11-30 NOTE — Patient Instructions (Signed)
Keep taking the medications the way you are  I will look to see if we can have someone come to your house to do a fall risk assesment

## 2011-12-02 ENCOUNTER — Other Ambulatory Visit: Payer: Self-pay | Admitting: Family Medicine

## 2011-12-02 DIAGNOSIS — Z9181 History of falling: Secondary | ICD-10-CM

## 2012-01-04 ENCOUNTER — Ambulatory Visit (INDEPENDENT_AMBULATORY_CARE_PROVIDER_SITE_OTHER): Payer: Medicare Other | Admitting: Family Medicine

## 2012-01-04 ENCOUNTER — Encounter: Payer: Self-pay | Admitting: Family Medicine

## 2012-01-04 VITALS — BP 194/83 | HR 80 | Temp 97.8°F | Ht 60.0 in | Wt 142.0 lb

## 2012-01-04 DIAGNOSIS — R634 Abnormal weight loss: Secondary | ICD-10-CM

## 2012-01-04 DIAGNOSIS — I10 Essential (primary) hypertension: Secondary | ICD-10-CM

## 2012-01-04 NOTE — Assessment & Plan Note (Signed)
Not controlled to day but likely due to her not taking AM medications.  No changes today.  Her edema is well controlled and she does not have symptoms of hypotension

## 2012-01-04 NOTE — Progress Notes (Signed)
  Subjective:    Patient ID: Frances [name], female    DOB: 1929-05-15, 78 y.o.   MRN: [id number]  HPI HYPERTENSION Disease Monitoring Home BP Monitoring not doing Chest pain- no     Dyspnea-  no  Medications Compliance: usually taking every day her aide puts out her medications but she did NOT take this AM.Was taking fluid pill once a day Lightheadedness-  no  Edema-  Mild no change from usual     WEIGHT LOSS She is concerned she is not gaining weight and her clothes dont fit.  Does not eat in the am no appetite but relates she has a good appetite later in day and eats a lot.  No food insecurity.  Likes sweets.   Generally feel quite well.  No fever or nausea or vomiting or blood in stools.  Has chronic renal failure and does not want dialysis   ROS - See HPI  PMH Lab Review   Potassium  Date Value Range Status  04/16/2011 4.3  3.5-5.1 (mEq/L) Final     Sodium  Date Value Range Status  04/16/2011 139  135-145 (mEq/L) Final       Review of Systems     Objective:   Physical Exam Alert interactive hard to understand Lungs:  Normal respiratory effort, chest expands symmetrically. Lungs are clear to auscultation, no crackles or wheezes. Heart - Regular rate and rhythm.  No murmurs, gallops or rubs.    Legs - 1+ edema with no skin breakdown.          Assessment & Plan:

## 2012-01-04 NOTE — Assessment & Plan Note (Signed)
Stable weight.  She is concerned about losing weight but does not have any red flags and her appetite decrease is likely due to renal failure. No furhter evaluation unless worsens

## 2012-01-04 NOTE — Patient Instructions (Signed)
Take your medicines every day even if you don't eat in the AM  Come back in 3 months  Ask Drinda Butts to call your insurance company to see if they will pay for the Zostavax  (shingles shot)  Our Health Coach - Rosalita Chessman will call you or Drinda Butts  about making a free Medicare Wellness visit

## 2012-02-02 ENCOUNTER — Other Ambulatory Visit: Payer: Self-pay | Admitting: Family Medicine

## 2012-02-02 MED ORDER — METOPROLOL SUCCINATE ER 100 MG PO TB24: 100.0000 mg | ORAL_TABLET | Freq: Every day | ORAL | Status: DC

## 2012-02-04 NOTE — Progress Notes (Signed)
Pt does not have transportation to get to [facility] appointment.  Pt usually get's rides from aid but she is only with patient until 9 am.

## 2012-04-11 ENCOUNTER — Encounter: Payer: Self-pay | Admitting: Family Medicine

## 2012-04-11 ENCOUNTER — Ambulatory Visit (INDEPENDENT_AMBULATORY_CARE_PROVIDER_SITE_OTHER): Payer: Medicare Other | Admitting: Family Medicine

## 2012-04-11 VITALS — BP 196/70 | HR 111 | Temp 97.7°F | Ht 60.0 in | Wt 135.0 lb

## 2012-04-11 DIAGNOSIS — I10 Essential (primary) hypertension: Secondary | ICD-10-CM

## 2012-04-11 DIAGNOSIS — M1990 Unspecified osteoarthritis, unspecified site: Secondary | ICD-10-CM

## 2012-04-11 NOTE — Assessment & Plan Note (Signed)
Not controlled this am but did not take any of her medications today.  Will continue current medications aiming for moderate control without lowering her diastolic dangerously

## 2012-04-11 NOTE — Patient Instructions (Addendum)
Stretch before getting out of bed  Ask Drinda Butts about your Tramadol you can take this when the pain is bad  Come back if it is getting worse

## 2012-04-11 NOTE — Progress Notes (Signed)
  Subjective:    Patient ID: Frances [name], female    DOB: May 09, 1929, 78 y.o.   MRN: [id number]  HPI  Leg Pain Has intermittent R thigh and left lower leg pain for a few weeks.  Worse when first awakens and is better as she walks.  Has not taken any pain medications.  No trauma or redness or deformity.  No weakness.  Her edema is under good control  Review of Symptoms - see HPI  PMH - Smoking status noted.       Review of Systems     Objective:   Physical Exam Legs - not tender to palpation.  Reasonable range of motion of all leg joints.   1+ edema bilaterally Able to stand and walk slowly holding her cane without pain        Assessment & Plan:

## 2012-04-11 NOTE — Assessment & Plan Note (Signed)
Her leg pain is most consistent with DJD with stiffness when first arises.  She has tramadol at home which I suggested she use if needed.  Also suggested stretching and moving her joints before walking

## 2012-04-14 ENCOUNTER — Telehealth: Payer: Self-pay | Admitting: Family Medicine

## 2012-04-14 NOTE — Telephone Encounter (Signed)
Needs FL2 filled out for dept of Social services - pls fax copy to: [phone]  mail to: DSS -attn: Jennette Kettle [address] [place], [phone]

## 2012-04-14 NOTE — Telephone Encounter (Signed)
Placed FL2 form in [place] box. [name], Frances [place]

## 2012-04-15 NOTE — Telephone Encounter (Signed)
Completed and put in to be faxed box  

## 2012-04-15 NOTE — Telephone Encounter (Signed)
FL-2 faxed to [phone].  Original mailed to DSS-Attn: Jennette Kettle, [address], [place], [place] [address].  Ileana Ladd

## 2012-04-19 ENCOUNTER — Telehealth: Payer: Self-pay | Admitting: Family Medicine

## 2012-04-19 NOTE — Telephone Encounter (Signed)
Dendra related will fax copy for me to correct

## 2012-04-19 NOTE — Telephone Encounter (Signed)
FL2 was not completed correctly.  [address] - home was checked, but needed to be checked for assisted living level of care.  That was the only thing that was not right, so a new one will need to be completed and sent in.

## 2012-04-20 NOTE — Telephone Encounter (Signed)
Called to check on the status of the FL2 Form.

## 2012-04-20 NOTE — Telephone Encounter (Signed)
I corrected on the copy they faxed to me and put in the to be faxed pile yesterday afternoon Thanks  LC

## 2012-04-21 ENCOUNTER — Ambulatory Visit
Admission: RE | Admit: 2012-04-21 | Discharge: 2012-04-21 | Disposition: A | Discharge status: Home / Self Care | Payer: Medicare Other | Source: Ambulatory Visit | Attending: Nephrology | Admitting: Nephrology

## 2012-04-21 ENCOUNTER — Other Ambulatory Visit: Payer: Self-pay | Admitting: Nephrology

## 2012-04-21 DIAGNOSIS — R634 Abnormal weight loss: Secondary | ICD-10-CM

## 2012-06-10 ENCOUNTER — Other Ambulatory Visit: Payer: Self-pay | Admitting: *Deleted

## 2012-06-10 MED ORDER — CLONIDINE HCL 0.2 MG PO TABS: 0.2000 mg | ORAL_TABLET | Freq: Two times a day (BID) | ORAL | Status: DC

## 2012-10-10 ENCOUNTER — Encounter (HOSPITAL_COMMUNITY): Payer: Self-pay | Admitting: Family Medicine

## 2012-10-10 ENCOUNTER — Emergency Department (HOSPITAL_COMMUNITY): Payer: Medicare Other

## 2012-10-10 ENCOUNTER — Inpatient Hospital Stay (HOSPITAL_COMMUNITY)
Admission: EM | Admit: 2012-10-10 | Discharge: 2012-10-13 | DRG: 291 | Disposition: A | Discharge status: Home Health | Payer: Medicare Other | Attending: Family Medicine | Admitting: Family Medicine

## 2012-10-10 DIAGNOSIS — I428 Other cardiomyopathies: Secondary | ICD-10-CM | POA: Diagnosis present

## 2012-10-10 DIAGNOSIS — R079 Chest pain, unspecified: Secondary | ICD-10-CM

## 2012-10-10 DIAGNOSIS — N289 Disorder of kidney and ureter, unspecified: Secondary | ICD-10-CM

## 2012-10-10 DIAGNOSIS — R0789 Other chest pain: Secondary | ICD-10-CM

## 2012-10-10 DIAGNOSIS — N184 Chronic kidney disease, stage 4 (severe): Secondary | ICD-10-CM | POA: Diagnosis present

## 2012-10-10 DIAGNOSIS — I517 Cardiomegaly: Secondary | ICD-10-CM | POA: Diagnosis present

## 2012-10-10 DIAGNOSIS — IMO0001 Reserved for inherently not codable concepts without codable children: Principal | ICD-10-CM

## 2012-10-10 DIAGNOSIS — I2510 Atherosclerotic heart disease of native coronary artery without angina pectoris: Secondary | ICD-10-CM | POA: Diagnosis present

## 2012-10-10 DIAGNOSIS — N390 Urinary tract infection, site not specified: Secondary | ICD-10-CM

## 2012-10-10 DIAGNOSIS — I5023 Acute on chronic systolic (congestive) heart failure: Secondary | ICD-10-CM

## 2012-10-10 DIAGNOSIS — Z9181 History of falling: Secondary | ICD-10-CM

## 2012-10-10 DIAGNOSIS — I509 Heart failure, unspecified: Secondary | ICD-10-CM | POA: Diagnosis present

## 2012-10-10 DIAGNOSIS — I5043 Acute on chronic combined systolic (congestive) and diastolic (congestive) heart failure: Secondary | ICD-10-CM | POA: Diagnosis present

## 2012-10-10 DIAGNOSIS — Z87891 Personal history of nicotine dependence: Secondary | ICD-10-CM

## 2012-10-10 DIAGNOSIS — N185 Chronic kidney disease, stage 5: Secondary | ICD-10-CM | POA: Diagnosis present

## 2012-10-10 DIAGNOSIS — I455 Other specified heart block: Secondary | ICD-10-CM | POA: Diagnosis not present

## 2012-10-10 DIAGNOSIS — I495 Sick sinus syndrome: Secondary | ICD-10-CM | POA: Diagnosis present

## 2012-10-10 DIAGNOSIS — I1310 Hypertensive heart and chronic kidney disease without heart failure, with stage 1 through stage 4 chronic kidney disease, or unspecified chronic kidney disease: Secondary | ICD-10-CM | POA: Diagnosis present

## 2012-10-10 DIAGNOSIS — I420 Dilated cardiomyopathy: Secondary | ICD-10-CM

## 2012-10-10 DIAGNOSIS — I10 Essential (primary) hypertension: Secondary | ICD-10-CM | POA: Diagnosis present

## 2012-10-10 DIAGNOSIS — R82998 Other abnormal findings in urine: Secondary | ICD-10-CM | POA: Diagnosis present

## 2012-10-10 DIAGNOSIS — Z2239 Carrier of other specified bacterial diseases: Secondary | ICD-10-CM

## 2012-10-10 DIAGNOSIS — Z8673 Personal history of transient ischemic attack (TIA), and cerebral infarction without residual deficits: Secondary | ICD-10-CM

## 2012-10-10 DIAGNOSIS — I871 Compression of vein: Secondary | ICD-10-CM | POA: Diagnosis present

## 2012-10-10 HISTORY — DX: Disorder of kidney and ureter, unspecified: N28.9

## 2012-10-10 HISTORY — DX: Essential (primary) hypertension: I10

## 2012-10-10 HISTORY — DX: Urinary tract infection, site not specified: N39.0

## 2012-10-10 LAB — POCT I-STAT, CHEM 8
Calcium, Ion: 1.22 mmol/L (ref 1.13–1.30)
HCT: 41 % (ref 36.0–46.0)
TCO2: 20 mmol/L (ref 0–100)

## 2012-10-10 LAB — CBC WITH DIFFERENTIAL/PLATELET
HCT: 39.4 % (ref 36.0–46.0)
Hemoglobin: 12.8 g/dL (ref 12.0–15.0)
Lymphs Abs: 1.5 10*3/uL (ref 0.7–4.0)
MCH: 27.2 pg (ref 26.0–34.0)
Monocytes Relative: 15 % — ABNORMAL HIGH (ref 3–12)
Neutro Abs: 3.7 10*3/uL (ref 1.7–7.7)
Neutrophils Relative %: 59 % (ref 43–77)
RBC: 4.71 MIL/uL (ref 3.87–5.11)

## 2012-10-10 LAB — URINALYSIS, ROUTINE W REFLEX MICROSCOPIC
Glucose, UA: NEGATIVE mg/dL
Specific Gravity, Urine: 1.015 (ref 1.005–1.030)

## 2012-10-10 LAB — URINE MICROSCOPIC-ADD ON

## 2012-10-10 LAB — PRO B NATRIURETIC PEPTIDE: Pro B Natriuretic peptide (BNP): 2199 pg/mL — ABNORMAL HIGH (ref 0–450)

## 2012-10-10 LAB — TROPONIN I: Troponin I: <0.3 ng/mL (ref <0.30)

## 2012-10-10 MED ORDER — HEPARIN SODIUM (PORCINE) 5000 UNIT/ML IJ SOLN
5000.0000 [IU] | Freq: Three times a day (TID) | INTRAMUSCULAR | Status: DC
  Administered 2012-10-10: 5000 [IU] via SUBCUTANEOUS
  Filled 2012-10-10 (×5): qty 1

## 2012-10-10 MED ORDER — ENOXAPARIN SODIUM 30 MG/0.3ML ~~LOC~~ SOLN: 30.0000 mg | SUBCUTANEOUS | Status: DC

## 2012-10-10 MED ORDER — SODIUM CHLORIDE 0.9 % IV SOLN
INTRAVENOUS | Status: AC
  Administered 2012-10-10: 75 mL/h via INTRAVENOUS

## 2012-10-10 MED ORDER — DEXTROSE 5 % IV SOLN
1.0000 g | Freq: Once | INTRAVENOUS | Status: AC
  Administered 2012-10-10: 1 g via INTRAVENOUS
  Filled 2012-10-10: qty 10

## 2012-10-10 MED ORDER — ACETAMINOPHEN 650 MG RE SUPP: 650.0000 mg | Freq: Four times a day (QID) | RECTAL | Status: DC | PRN

## 2012-10-10 MED ORDER — ASPIRIN EC 81 MG PO TBEC
81.0000 mg | DELAYED_RELEASE_TABLET | Freq: Every day | ORAL | Status: DC
  Administered 2012-10-10: 81 mg via ORAL
  Filled 2012-10-10 (×2): qty 1

## 2012-10-10 MED ORDER — SODIUM CHLORIDE 0.9 % IV SOLN
INTRAVENOUS | Status: DC
  Administered 2012-10-10: 09:00:00 via INTRAVENOUS

## 2012-10-10 MED ORDER — METOPROLOL TARTRATE 1 MG/ML IV SOLN: 2.5000 mg | INTRAVENOUS | Status: DC | PRN

## 2012-10-10 MED ORDER — FUROSEMIDE 40 MG PO TABS
40.0000 mg | ORAL_TABLET | Freq: Once | ORAL | Status: DC
  Filled 2012-10-10: qty 1

## 2012-10-10 MED ORDER — CLONIDINE HCL 0.2 MG PO TABS
0.2000 mg | ORAL_TABLET | Freq: Two times a day (BID) | ORAL | Status: DC
  Administered 2012-10-10 – 2012-10-11 (×2): 0.2 mg via ORAL
  Filled 2012-10-10 (×3): qty 1

## 2012-10-10 MED ORDER — CINACALCET HCL 30 MG PO TABS
60.0000 mg | ORAL_TABLET | Freq: Every day | ORAL | Status: DC
  Administered 2012-10-10 – 2012-10-13 (×4): 60 mg via ORAL
  Filled 2012-10-10 (×4): qty 2

## 2012-10-10 MED ORDER — NITROGLYCERIN 0.4 MG SL SUBL: 0.4000 mg | SUBLINGUAL_TABLET | SUBLINGUAL | Status: DC | PRN

## 2012-10-10 MED ORDER — ACETAMINOPHEN 325 MG PO TABS
650.0000 mg | ORAL_TABLET | Freq: Four times a day (QID) | ORAL | Status: DC | PRN
Start: 2012-10-10 — End: 2012-10-13

## 2012-10-10 MED ORDER — NITROGLYCERIN 0.3 MG SL SUBL
0.3000 mg | SUBLINGUAL_TABLET | SUBLINGUAL | Status: DC | PRN
  Filled 2012-10-10: qty 100

## 2012-10-10 MED ORDER — TRAMADOL HCL 50 MG PO TABS
50.0000 mg | ORAL_TABLET | Freq: Two times a day (BID) | ORAL | Status: DC | PRN
  Filled 2012-10-10: qty 1

## 2012-10-10 MED ORDER — ASPIRIN 81 MG PO TABS: 81.0000 mg | ORAL_TABLET | Freq: Every day | ORAL | Status: DC

## 2012-10-10 MED ORDER — CALCITRIOL 0.25 MCG PO CAPS
0.2500 ug | ORAL_CAPSULE | Freq: Every day | ORAL | Status: DC
  Administered 2012-10-10 – 2012-10-13 (×4): 0.25 ug via ORAL
  Filled 2012-10-10 (×4): qty 1

## 2012-10-10 MED ORDER — FUROSEMIDE 80 MG PO TABS: 80.0000 mg | ORAL_TABLET | Freq: Every day | ORAL | Status: DC

## 2012-10-10 MED ORDER — METOPROLOL SUCCINATE ER 100 MG PO TB24
100.0000 mg | ORAL_TABLET | Freq: Every day | ORAL | Status: DC
  Administered 2012-10-10: 100 mg via ORAL
  Filled 2012-10-10 (×2): qty 1

## 2012-10-10 NOTE — H&P (Signed)
I have seen and examined this patient. I have discussed with Dr Armen Pickup.  I agree with their findings and plans as documented in their admission note.  Acute Issues 1. Chest pain - known CAD - Pt currently without chest discomfort. Agree with admission for rule out ACS

## 2012-10-10 NOTE — ED Notes (Addendum)
Per EMS, left sided chest pain x 2 hours. Denies any associated symptoms. Pt hx of renal failure. Suppose to be on dialysis but refusing. Peaked T waves on monitor. Possible depression . Pt given 4 baby ASA PTA and 1 nitro and pain a 0 upon arrival

## 2012-10-10 NOTE — ED Notes (Signed)
Pt back from being out of the department; placed back on monitor,continuous pulse oximetry and blood pressure cuff

## 2012-10-10 NOTE — H&P (Signed)
Frances [name] is an 79 y.o. female.   PCP: Deirdre Priest, MCPFC Chief Complaint: chest pain  HPI:  79 yo F with known history of CAD and CKD stage IV  presents with L sided chest pain. She reports a history of recurrent chest pain most days. This AM she has signigicant L sided chest pain. She reports that the pain started around 7 AM, it started at rest, she laid back in bed. Her pain was sharp, non-radiating. She denies associated lightheadedness, dizziness, shortness of breath. She reports that the pain resolved after 30 minutes. She informed her home health aide about her pain. Her aide called EMS.   She lives alone. She reports last taking her medications two days ago. She reports cold symptoms over the weekend. She reports getting out of bed only to drink water and use the bedside commode over the weekend. She has one son Aniya Jolicoeur) he lives in [place].   Past Medical History  Diagnosis Date  . CAD (coronary artery disease) 2005    Last cath in 2005 - no significant disease  . Normal echocardiogram 02/26/2011    mild findings EJ fx 55   . Hypertension   . Renal disorder    Past Surgical History  Procedure Date  . Abdominal surgery     midline abdominal scar   . Knee surgery     both knees    History reviewed. No pertinent family history. Social History:  reports that she has never smoked. She does not have any smokeless tobacco history on file. She reports that she does not drink alcohol. Her drug history not on file.  Allergies: No Known Allergies  Results for orders placed during the hospital encounter of 10/10/12 (from the past 48 hour(s))  CBC WITH DIFFERENTIAL     Status: Abnormal   Collection Time   10/10/12  8:44 AM      Component Value Range Comment   WBC 6.3  4.0 - 10.5 K/uL    RBC 4.71  3.87 - 5.11 MIL/uL    Hemoglobin 12.8  12.0 - 15.0 g/dL    HCT 29.5  28.4 - 13.2 %    MCV 83.7  78.0 - 100.0 fL    MCH 27.2  26.0 - 34.0 pg    MCHC 32.5  30.0 - 36.0 g/dL    RDW 44.0  10.2 - 72.5 %    Platelets 135 (*) 150 - 400 K/uL    Neutrophils Relative 59  43 - 77 %    Neutro Abs 3.7  1.7 - 7.7 K/uL    Lymphocytes Relative 24  12 - 46 %    Lymphs Abs 1.5  0.7 - 4.0 K/uL    Monocytes Relative 15 (*) 3 - 12 %    Monocytes Absolute 1.0  0.1 - 1.0 K/uL    Eosinophils Relative 1  0 - 5 %    Eosinophils Absolute 0.0  0.0 - 0.7 K/uL    Basophils Relative 0  0 - 1 %    Basophils Absolute 0.0  0.0 - 0.1 K/uL   TROPONIN I     Status: Normal   Collection Time   10/10/12  8:44 AM      Component Value Range Comment   Troponin I <0.30  <0.30 ng/mL   PRO B NATRIURETIC PEPTIDE     Status: Abnormal   Collection Time   10/10/12  8:44 AM      Component Value Range Comment  Pro B Natriuretic peptide (BNP) 2199.0 (*) 0 - 450 pg/mL   POCT I-STAT, CHEM 8     Status: Abnormal   Collection Time   10/10/12  8:53 AM      Component Value Range Comment   Sodium 139  135 - 145 mEq/L    Potassium 3.9  3.5 - 5.1 mEq/L    Chloride 108  96 - 112 mEq/L    BUN 47 (*) 6 - 23 mg/dL    Creatinine, Ser 9.60 (*) 0.50 - 1.10 mg/dL    Glucose, Bld 90  70 - 99 mg/dL    Calcium, Ion 4.54  0.98 - 1.30 mmol/L    TCO2 20  0 - 100 mmol/L    Hemoglobin 13.9  12.0 - 15.0 g/dL    HCT 11.9  14.7 - 82.9 %   URINALYSIS, ROUTINE W REFLEX MICROSCOPIC     Status: Abnormal   Collection Time   10/10/12  9:09 AM      Component Value Range Comment   Color, Urine YELLOW  YELLOW    APPearance CLOUDY (*) CLEAR    Specific Gravity, Urine 1.015  1.005 - 1.030    pH 5.0  5.0 - 8.0    Glucose, UA NEGATIVE  NEGATIVE mg/dL    Hgb urine dipstick SMALL (*) NEGATIVE    Bilirubin Urine SMALL (*) NEGATIVE    Ketones, ur 15 (*) NEGATIVE mg/dL    Protein, ur 562 (*) NEGATIVE mg/dL    Urobilinogen, UA 0.2  0.0 - 1.0 mg/dL    Nitrite NEGATIVE  NEGATIVE    Leukocytes, UA MODERATE (*) NEGATIVE   URINE MICROSCOPIC-ADD ON     Status: Abnormal   Collection Time   10/10/12  9:09 AM      Component Value Range Comment    Squamous Epithelial / LPF RARE  RARE    WBC, UA 21-50  <3 WBC/hpf    RBC / HPF 0-2  <3 RBC/hpf    Bacteria, UA MANY (*) RARE    Casts HYALINE CASTS (*) NEGATIVE GRANULAR CAST   Dg Chest 2 View  10/10/2012  *RADIOLOGY REPORT*  Clinical Data: Chest pain possible infiltrates seen in the left lung base on portable chest radiograph performed earlier today  CHEST - 2 VIEW  Comparison: Portable chest radiograph 10/10/2012  Findings: Current radiograph is performed at 9:56 hours.  Heart size is mildly enlarged and stable.  There is atherosclerotic calcification of the thoracic aortic arch.  Stable right paratracheal contour is likely related to vascular structures or right thyroid lobe prominence, as it is unchanged dating back to chest radiograph 06/11/2004.  There is slight volume loss from atelectasis in the lateral left lung base.  No definite airspace disease is seen.  Aeration at the left lung bases improved compared to the portable chest radiograph performed earlier today.  The pleural effusion or pneumothorax.  No acute bony abnormality is identified.  IMPRESSION:  1.  Probable atelectasis and/or scarring at the left lung base.  No definite airspace disease. Improved aeration of the left lung base compared to the portable chest radiograph performed earlier today. 2.  Stable mild cardiomegaly.   Original Report Authenticated By: Britta Mccreedy, M.D.    Dg Chest Port 1 View  10/10/2012  *RADIOLOGY REPORT*  Clinical Data: Chest pain, shortness of breath  PORTABLE CHEST - 1 VIEW  Comparison: 04/21/2012  Findings: Cardiomegaly.  Study is limited by poor inspiration. There is hazy left basilar atelectasis or infiltrate.  No pulmonary edema.  Atherosclerotic calcifications of thoracic aorta.  IMPRESSION: Limited study by poor inspiration.  Hazy left basilar atelectasis or early infiltrate.  No pulmonary edema.   Original Report Authenticated By: Natasha Mead, M.D.    EKG: sinus tachycardia. T wave inversions. HR  112.   ROS As per HPI denies abdominal pain, dysuria, frequency and urgency   Blood pressure 181/74, pulse 104, temperature 98.5 F (36.9 C), temperature source Oral, resp. rate 18, SpO2 98.00%. Physical Exam  General appearance: alert, cooperative and no distress Head: Normocephalic, without obvious abnormality, atraumatic Eyes: conjunctivae/corneas clear. PERRL, EOM's intact. Ears: normal TM and external ear canal left ear and abnormal TM right ear -impacted cerumen.  Throat: lips, mucosa, and tongue dry ; edentulous  Back: scoliosis  Lungs: normal work of breahting, crackles R base  Chest: no chest wall tenderness.  Heart: regular rate and rhythm, S1, S2 normal, no murmur, click, rub or gallop Extremities: extremities normal, atraumatic, no cyanosis or edema Pulses: 2+ and symmetric Skin: Skin color, texture, turgor normal. No rashes or lesions, no sacral or calcaneal pressure ulcers  Neurologic: Grossly normal  Assessment/Plan 79 yo F with known CAD and CKD presents with chest pain. No active chest pain.   1. Chest pain:  A: no active pain. Known CAD. History of intermittent sharp chest pains concerning for GERD  P:  -admit to tele -cycle cardiac enzymes -Restart beta blocker and clonidine (no meds for 48 hrs per patient).  -prn IV metoprolol for SBP > 180  -prn nitroglycerin -A1c and lipid panel in AM.   2. Cardiomegaly A: secondary to HTN for heart failure (patient with a history of diastolic dysfunction). Her proBNP is elevated but she is not fluid overloaded on exam.  P:  Hold daily lasix while hydrating.  Repeat ECHO  3. Chronic kidney disease stage IV: baseline Cr 2.3-3 (up to 5.3) followed by a nephrologist.  A: No electrolyte or acid-base derangement. No fluid overload.  P: Hold lasix IV x 12 hrs Recheck renal function panel in AM.   4. ? UTI: asymptomatic per ROS. She is most likely colonized. I do not trust specimen (per EDP may have been bed pan  specimen).  P: no further treatment. Follow up urine culture.   5. FEN/GI: heart healthy diet.   6.  Code: limited  7. Dispo: pending chest pain rule out. Patient will definitely require continued home health for medication assistance upon discharge.   Kaleel Schmieder 10/10/2012, 3:57 PM Family Medicine Teaching Service, [phone].

## 2012-10-10 NOTE — ED Provider Notes (Signed)
History     CSN: [id number]  Arrival date & time 10/10/12  1478   First MD Initiated Contact with Patient 10/10/12 0825      Chief Complaint  Patient presents with  . Chest Pain    (Consider location/radiation/quality/duration/timing/severity/associated sxs/prior treatment) Patient is a 79 y.o. female presenting with chest pain. The history is provided by the patient.  Chest Pain Primary symptoms include shortness of breath. Pertinent negatives for primary symptoms include no abdominal pain, no nausea and no vomiting.  Pertinent negatives for associated symptoms include no numbness and no weakness.    patient developed some left-sided chest pressure last night. It has been relieved with nitroglycerin. She is pain-free now. She states she's had some trouble breathing. No fevers. No coughing. No swelling or legs. She states she's had pain like this before. She states she does have a history kidney problems, [place] she does not want dialysis. Patient had a negative heart cath back in 2005.  Past Medical History  Diagnosis Date  . CAD (coronary artery disease) 2005    Last cath in 2005 - no significant disease  . Normal echocardiogram 02/26/2011    mild findings EJ fx 55   . Hypertension   . Renal disorder     History reviewed. No pertinent past surgical history.  History reviewed. No pertinent family history.  History  Substance Use Topics  . Smoking status: Never Smoker   . Smokeless tobacco: Not on file  . Alcohol Use: No    OB History    Grav Para Term Preterm Abortions TAB SAB Ect Mult Living                  Review of Systems  Constitutional: Negative for activity change and appetite change.  HENT: Negative for neck stiffness.   Eyes: Negative for pain.  Respiratory: Positive for shortness of breath. Negative for chest tightness.   Cardiovascular: Positive for chest pain. Negative for leg swelling.  Gastrointestinal: Negative for nausea, vomiting, abdominal  pain and diarrhea.  Genitourinary: Negative for flank pain.  Musculoskeletal: Negative for back pain.  Skin: Negative for rash.  Neurological: Negative for weakness, numbness and headaches.  Psychiatric/Behavioral: Negative for behavioral problems.    Allergies  Review of patient's allergies indicates no known allergies.  Home Medications   Current Outpatient Rx  Name  Route  Sig  Dispense  Refill  . ASPIRIN 81 MG PO TABS   Oral   Take 1 tablet (81 mg total) by mouth daily.   90 tablet   3     Please send all refill request electronically - Th ...   . CALCITRIOL 0.25 MCG PO CAPS   Oral   Take 0.25 mcg by mouth daily.           Marland Kitchen CALCIUM CARBONATE-VITAMIN D 500-200 MG-UNIT PO TABS   Oral   Take 1 tablet by mouth 3 (three) times daily.         Marland Kitchen CINACALCET HCL 60 MG PO TABS   Oral   Take 60 mg by mouth daily.           Marland Kitchen CLONIDINE HCL 0.2 MG PO TABS   Oral   Take 1 tablet (0.2 mg total) by mouth 2 (two) times daily.   60 tablet   6   . FUROSEMIDE 80 MG PO TABS   Oral   Take 80 mg by mouth 2 (two) times daily.          Marland Kitchen  METOPROLOL SUCCINATE ER 100 MG PO TB24   Oral   Take 1 tablet (100 mg total) by mouth daily.   31 tablet   11     Please send all refill request electronically NOT  .Marland Kitchen.   . TRAMADOL HCL 50 MG PO TABS   Oral   Take 1 tablet (50 mg total) by mouth 2 (two) times daily as needed.   30 tablet   3     BP 183/81  Pulse 107  Temp 98.5 F (36.9 C) (Oral)  Resp 18  SpO2 99%  Physical Exam  Nursing note and vitals reviewed. Constitutional: She is oriented to person, place, and time. She appears well-developed and well-nourished.  HENT:  Head: Normocephalic and atraumatic.  Eyes: EOM are normal. Pupils are equal, round, and reactive to light.  Neck: Normal range of motion. Neck supple.  Cardiovascular: Normal rate, regular rhythm and normal heart sounds.   No murmur heard. Pulmonary/Chest: Effort normal. No respiratory distress.  She has no wheezes. She has no rales. She exhibits no tenderness.       Mildly harsh breath sounds throughout.  Abdominal: Soft. Bowel sounds are normal. She exhibits no distension. There is no tenderness. There is no rebound and no guarding.  Musculoskeletal: Normal range of motion. She exhibits no edema.  Neurological: She is alert and oriented to person, place, and time. No cranial nerve deficit.  Skin: Skin is warm and dry.  Psychiatric: She has a normal mood and affect. Her speech is normal.    ED Course  Procedures (including critical care time)  Labs Reviewed  CBC WITH DIFFERENTIAL - Abnormal; Notable for the following:    Platelets 135 (*)     Monocytes Relative 15 (*)     All other components within normal limits  URINALYSIS, ROUTINE W REFLEX MICROSCOPIC - Abnormal; Notable for the following:    APPearance CLOUDY (*)     Hgb urine dipstick SMALL (*)     Bilirubin Urine SMALL (*)     Ketones, ur 15 (*)     Protein, ur 100 (*)     Leukocytes, UA MODERATE (*)     All other components within normal limits  PRO B NATRIURETIC PEPTIDE - Abnormal; Notable for the following:    Pro B Natriuretic peptide (BNP) 2199.0 (*)     All other components within normal limits  POCT I-STAT, CHEM 8 - Abnormal; Notable for the following:    BUN 47 (*)     Creatinine, Ser 3.90 (*)     All other components within normal limits  URINE MICROSCOPIC-ADD ON - Abnormal; Notable for the following:    Bacteria, UA MANY (*)     Casts HYALINE CASTS (*)  GRANULAR CAST   All other components within normal limits  TROPONIN I  URINE CULTURE   Dg Chest 2 View  10/10/2012  *RADIOLOGY REPORT*  Clinical Data: Chest pain possible infiltrates seen in the left lung base on portable chest radiograph performed earlier today  CHEST - 2 VIEW  Comparison: Portable chest radiograph 10/10/2012  Findings: Current radiograph is performed at 9:56 hours.  Heart size is mildly enlarged and stable.  There is atherosclerotic  calcification of the thoracic aortic arch.  Stable right paratracheal contour is likely related to vascular structures or right thyroid lobe prominence, as it is unchanged dating back to chest radiograph 06/11/2004.  There is slight volume loss from atelectasis in the lateral left lung base.  No definite  airspace disease is seen.  Aeration at the left lung bases improved compared to the portable chest radiograph performed earlier today.  The pleural effusion or pneumothorax.  No acute bony abnormality is identified.  IMPRESSION:  1.  Probable atelectasis and/or scarring at the left lung base.  No definite airspace disease. Improved aeration of the left lung base compared to the portable chest radiograph performed earlier today. 2.  Stable mild cardiomegaly.   Original Report Authenticated By: Britta Mccreedy, M.D.    Dg Chest Port 1 View  10/10/2012  *RADIOLOGY REPORT*  Clinical Data: Chest pain, shortness of breath  PORTABLE CHEST - 1 VIEW  Comparison: 04/21/2012  Findings: Cardiomegaly.  Study is limited by poor inspiration. There is hazy left basilar atelectasis or infiltrate.  No pulmonary edema.  Atherosclerotic calcifications of thoracic aorta.  IMPRESSION: Limited study by poor inspiration.  Hazy left basilar atelectasis or early infiltrate.  No pulmonary edema.   Original Report Authenticated By: Natasha Mead, M.D.      1. Chest pain   2. UTI (urinary tract infection)   3. Renal insufficiency      Date: 10/10/2012  Rate:112  Rhythm: sinus tachycardia  QRS Axis: normal  Intervals: normal  ST/T Wave abnormalities: nonspecific ST/T changes  Conduction Disutrbances:lvh  Narrative Interpretation: Increasing LVH with likely repolarization abnormalities inferiorly and laterally.  Old EKG Reviewed: changes noted    MDM  Patient with left-sided chest pain. EKG reassuring. Initial x-ray showed possible infiltrate, however 2 view did not show possible pneumonia. She also has a UTI. She'll be admitted  to family practice for further evaluation and treatment.        Juliet Rude. Rubin Payor, MD 10/10/12 1429

## 2012-10-10 NOTE — ED Notes (Signed)
Caregiver: Etheleen Nicks [phone]

## 2012-10-10 NOTE — Progress Notes (Signed)
Lab called critical troponin of 0.31.  MD notified, no new orders given.

## 2012-10-10 NOTE — ED Notes (Signed)
Pt undressed, in gown, on monitor, continuous pulse oximetry and blood pressure cuff; EKG performed 

## 2012-10-11 ENCOUNTER — Other Ambulatory Visit: Payer: Self-pay

## 2012-10-11 HISTORY — PX: TRANSTHORACIC ECHOCARDIOGRAM: SHX275

## 2012-10-11 LAB — LIPID PANEL
Cholesterol: 207 mg/dL — ABNORMAL HIGH (ref 0–200)
HDL: 96 mg/dL (ref >39)
LDL Cholesterol: 96 mg/dL (ref 0–99)
Triglycerides: 77 mg/dL (ref <150)

## 2012-10-11 LAB — CBC
Hemoglobin: 11.6 g/dL — ABNORMAL LOW (ref 12.0–15.0)
MCH: 26.6 pg (ref 26.0–34.0)
RBC: 4.36 MIL/uL (ref 3.87–5.11)
WBC: 4.7 10*3/uL (ref 4.0–10.5)

## 2012-10-11 LAB — BASIC METABOLIC PANEL
GFR calc non Af Amer: 12 mL/min — ABNORMAL LOW (ref >90)
Glucose, Bld: 91 mg/dL (ref 70–99)
Potassium: 4.5 mEq/L (ref 3.5–5.1)
Sodium: 141 mEq/L (ref 135–145)

## 2012-10-11 LAB — HEPARIN LEVEL (UNFRACTIONATED): Heparin Unfractionated: 0.51 IU/mL (ref 0.30–0.70)

## 2012-10-11 LAB — HEMOGLOBIN A1C
Hgb A1c MFr Bld: 5.2 % (ref <5.7)
Mean Plasma Glucose: 103 mg/dL (ref <117)

## 2012-10-11 MED ORDER — FUROSEMIDE 10 MG/ML IJ SOLN
80.0000 mg | Freq: Once | INTRAMUSCULAR | Status: AC
  Administered 2012-10-11: 80 mg via INTRAVENOUS
  Filled 2012-10-11: qty 8

## 2012-10-11 MED ORDER — SODIUM CHLORIDE 0.9 % IV SOLN
INTRAVENOUS | Status: DC | PRN
  Administered 2012-10-11: 06:00:00 via INTRAVENOUS

## 2012-10-11 MED ORDER — HEPARIN BOLUS VIA INFUSION
1500.0000 [IU] | Freq: Once | INTRAVENOUS | Status: AC
  Administered 2012-10-11: 1500 [IU] via INTRAVENOUS
  Filled 2012-10-11: qty 1500

## 2012-10-11 MED ORDER — SODIUM CHLORIDE 0.9 % IJ SOLN
3.0000 mL | Freq: Two times a day (BID) | INTRAMUSCULAR | Status: DC
  Administered 2012-10-11 – 2012-10-13 (×5): 3 mL via INTRAVENOUS

## 2012-10-11 MED ORDER — ATORVASTATIN CALCIUM 40 MG PO TABS
40.0000 mg | ORAL_TABLET | Freq: Every day | ORAL | Status: DC
  Administered 2012-10-11 – 2012-10-12 (×2): 40 mg via ORAL
  Filled 2012-10-11 (×4): qty 1

## 2012-10-11 MED ORDER — HEPARIN SODIUM (PORCINE) 5000 UNIT/ML IJ SOLN
5000.0000 [IU] | Freq: Three times a day (TID) | INTRAMUSCULAR | Status: DC
  Administered 2012-10-12 – 2012-10-13 (×5): 5000 [IU] via SUBCUTANEOUS
  Filled 2012-10-11 (×8): qty 1

## 2012-10-11 MED ORDER — FUROSEMIDE 80 MG PO TABS
80.0000 mg | ORAL_TABLET | Freq: Two times a day (BID) | ORAL | Status: DC
  Filled 2012-10-11 (×3): qty 1

## 2012-10-11 MED ORDER — ASPIRIN 325 MG PO TABS
325.0000 mg | ORAL_TABLET | Freq: Every day | ORAL | Status: DC
  Administered 2012-10-11 – 2012-10-13 (×3): 325 mg via ORAL
  Filled 2012-10-11 (×3): qty 1

## 2012-10-11 MED ORDER — ISOSORBIDE MONONITRATE 15 MG HALF TABLET
15.0000 mg | ORAL_TABLET | Freq: Every day | ORAL | Status: DC
  Administered 2012-10-12: 15 mg via ORAL
  Filled 2012-10-11 (×2): qty 1

## 2012-10-11 MED ORDER — CLONIDINE HCL 0.1 MG PO TABS
0.1000 mg | ORAL_TABLET | Freq: Two times a day (BID) | ORAL | Status: DC
  Filled 2012-10-11 (×4): qty 1

## 2012-10-11 MED ORDER — BIOTENE DRY MOUTH MT LIQD
15.0000 mL | Freq: Two times a day (BID) | OROMUCOSAL | Status: DC
  Administered 2012-10-11 – 2012-10-13 (×5): 15 mL via OROMUCOSAL

## 2012-10-11 MED ORDER — HEPARIN (PORCINE) IN NACL 100-0.45 UNIT/ML-% IJ SOLN
700.0000 [IU]/h | INTRAMUSCULAR | Status: DC
  Administered 2012-10-11: 700 [IU]/h via INTRAVENOUS
  Filled 2012-10-11: qty 250

## 2012-10-11 MED ORDER — METOPROLOL TARTRATE 50 MG PO TABS
50.0000 mg | ORAL_TABLET | Freq: Two times a day (BID) | ORAL | Status: DC
  Administered 2012-10-12: 50 mg via ORAL
  Filled 2012-10-11 (×4): qty 1

## 2012-10-11 MED ORDER — METOPROLOL SUCCINATE ER 50 MG PO TB24: 150.0000 mg | ORAL_TABLET | Freq: Every day | ORAL | Status: DC

## 2012-10-11 MED ORDER — MORPHINE SULFATE 2 MG/ML IJ SOLN: 1.0000 mg | INTRAMUSCULAR | Status: DC | PRN

## 2012-10-11 MED ORDER — FUROSEMIDE 80 MG PO TABS
80.0000 mg | ORAL_TABLET | Freq: Two times a day (BID) | ORAL | Status: DC
  Administered 2012-10-12: 80 mg via ORAL
  Filled 2012-10-11 (×3): qty 1

## 2012-10-11 NOTE — Progress Notes (Signed)
1610 Was notified by monitor tech that patient had a missed beat of 1.44 seconds. Entered patient room she was alert and resting in bed.  Pt.asymptomatic. VS were taken and were documented. On call MD for family practice was called and notified. New orders were written. MD on call for family practice came to assess patient. EKG was also done and  heparin drip started. Patient still alert and responsive with no c/o of chest pain.

## 2012-10-11 NOTE — Consult Note (Signed)
Reason for Consult: NSTEMI Referring Physician: Leighton Roach McDiarmid, MD   HPI: [name] is an 79 y.o. female and patient of Dr. Elissa Hefty. She has a history diastolic HF, CKD, HTN and HLD. She underwent a LHC in 2005, which revealed normal coronaries. Her last Myoview NST was in 2009 and was low risk for ischemia. Her last echo was in June 2012, which demonstrated Grade I diastolic dysfunction, and only mild to moderate systolic dysfunction, with an EF of 40-45%.  She presented to [facility] on 1/13 with a chief complaint of left-sided chest pain, characterized as sharp and non-radiating, with no associated SOB, diaphoresis, n/v. Her cardiac enzymes were cycled and troponins were elevated x 2, at 0.31 and 0.33. Initial EKG showed sinus tachycardia with a rate of 112bpm, ST depressions and LVH. She reports that she is currently CP free and denies any other associated symptoms, including SOB, diaphoresis, n/v, lightheadedness/dizziness.   Past Medical History  Diagnosis Date  . CAD (coronary artery disease) 2005    Last cath in 2005 - no significant disease  . Normal echocardiogram 02/26/2011    mild findings EJ fx 55   . Hypertension   . Renal disorder   . UTI (lower urinary tract infection) 10/10/2012    Past Surgical History  Procedure Date  . Abdominal surgery     midline abdominal scar   . Knee surgery     both knees     History reviewed. No pertinent family history.  Social History:  reports that she has quit smoking. She has never used smokeless tobacco. She reports that she does not drink alcohol or use illicit drugs.  Allergies: No Known Allergies  Medications: I have reviewed the patient's current medications.  Results for orders placed during the hospital encounter of 10/10/12 (from the past 48 hour(s))  CBC WITH DIFFERENTIAL     Status: Abnormal   Collection Time   10/10/12  8:44 AM      Component Value Range Comment   WBC 6.3  4.0 - 10.5 K/uL    RBC 4.71  3.87 - 5.11 MIL/uL      Hemoglobin 12.8  12.0 - 15.0 g/dL    HCT 40.9  81.1 - 91.4 %    MCV 83.7  78.0 - 100.0 fL    MCH 27.2  26.0 - 34.0 pg    MCHC 32.5  30.0 - 36.0 g/dL    RDW 78.2  95.6 - 21.3 %    Platelets 135 (*) 150 - 400 K/uL    Neutrophils Relative 59  43 - 77 %    Neutro Abs 3.7  1.7 - 7.7 K/uL    Lymphocytes Relative 24  12 - 46 %    Lymphs Abs 1.5  0.7 - 4.0 K/uL    Monocytes Relative 15 (*) 3 - 12 %    Monocytes Absolute 1.0  0.1 - 1.0 K/uL    Eosinophils Relative 1  0 - 5 %    Eosinophils Absolute 0.0  0.0 - 0.7 K/uL    Basophils Relative 0  0 - 1 %    Basophils Absolute 0.0  0.0 - 0.1 K/uL   TROPONIN I     Status: Normal   Collection Time   10/10/12  8:44 AM      Component Value Range Comment   Troponin I <0.30  <0.30 ng/mL   PRO B NATRIURETIC PEPTIDE     Status: Abnormal   Collection Time   10/10/12  8:44 AM      Component Value Range Comment   Pro B Natriuretic peptide (BNP) 2199.0 (*) 0 - 450 pg/mL   POCT I-STAT, CHEM 8     Status: Abnormal   Collection Time   10/10/12  8:53 AM      Component Value Range Comment   Sodium 139  135 - 145 mEq/L    Potassium 3.9  3.5 - 5.1 mEq/L    Chloride 108  96 - 112 mEq/L    BUN 47 (*) 6 - 23 mg/dL    Creatinine, Ser 1.61 (*) 0.50 - 1.10 mg/dL    Glucose, Bld 90  70 - 99 mg/dL    Calcium, Ion 0.96  0.45 - 1.30 mmol/L    TCO2 20  0 - 100 mmol/L    Hemoglobin 13.9  12.0 - 15.0 g/dL    HCT 40.9  81.1 - 91.4 %   URINALYSIS, ROUTINE W REFLEX MICROSCOPIC     Status: Abnormal   Collection Time   10/10/12  9:09 AM      Component Value Range Comment   Color, Urine YELLOW  YELLOW    APPearance CLOUDY (*) CLEAR    Specific Gravity, Urine 1.015  1.005 - 1.030    pH 5.0  5.0 - 8.0    Glucose, UA NEGATIVE  NEGATIVE mg/dL    Hgb urine dipstick SMALL (*) NEGATIVE    Bilirubin Urine SMALL (*) NEGATIVE    Ketones, ur 15 (*) NEGATIVE mg/dL    Protein, ur 782 (*) NEGATIVE mg/dL    Urobilinogen, UA 0.2  0.0 - 1.0 mg/dL    Nitrite NEGATIVE  NEGATIVE     Leukocytes, UA MODERATE (*) NEGATIVE   URINE MICROSCOPIC-ADD ON     Status: Abnormal   Collection Time   10/10/12  9:09 AM      Component Value Range Comment   Squamous Epithelial / LPF RARE  RARE    WBC, UA 21-50  <3 WBC/hpf    RBC / HPF 0-2  <3 RBC/hpf    Bacteria, UA MANY (*) RARE    Casts HYALINE CASTS (*) NEGATIVE GRANULAR CAST  URINE CULTURE     Status: Normal (Preliminary result)   Collection Time   10/10/12  9:09 AM      Component Value Range Comment   Specimen Description URINE, CLEAN CATCH      Special Requests NONE      Culture  Setup Time 10/10/2012 10:24      Colony Count >=100,000 COLONIES/ML      Culture ESCHERICHIA COLI      Report Status PENDING     TROPONIN I     Status: Abnormal   Collection Time   10/10/12  3:51 PM      Component Value Range Comment   Troponin I 0.31 (*) <0.30 ng/mL   TROPONIN I     Status: Abnormal   Collection Time   10/10/12  9:30 PM      Component Value Range Comment   Troponin I 0.33 (*) <0.30 ng/mL   BASIC METABOLIC PANEL     Status: Abnormal   Collection Time   10/11/12  5:40 AM      Component Value Range Comment   Sodium 141  135 - 145 mEq/L    Potassium 4.5  3.5 - 5.1 mEq/L    Chloride 105  96 - 112 mEq/L    CO2 22  19 - 32 mEq/L    Glucose,  Bld 91  70 - 99 mg/dL    BUN 48 (*) 6 - 23 mg/dL    Creatinine, Ser 4.09 (*) 0.50 - 1.10 mg/dL    Calcium 9.7  8.4 - 81.1 mg/dL    GFR calc non Af Amer 12 (*) >90 mL/min    GFR calc Af Amer 13 (*) >90 mL/min   CBC     Status: Abnormal   Collection Time   10/11/12  5:40 AM      Component Value Range Comment   WBC 4.7  4.0 - 10.5 K/uL    RBC 4.36  3.87 - 5.11 MIL/uL    Hemoglobin 11.6 (*) 12.0 - 15.0 g/dL    HCT 91.4  78.2 - 95.6 %    MCV 84.4  78.0 - 100.0 fL    MCH 26.6  26.0 - 34.0 pg    MCHC 31.5  30.0 - 36.0 g/dL    RDW 21.3  08.6 - 57.8 %    Platelets 157  150 - 400 K/uL   LIPID PANEL     Status: Abnormal   Collection Time   10/11/12  5:40 AM      Component Value Range Comment     Cholesterol 207 (*) 0 - 200 mg/dL    Triglycerides 77  <469 mg/dL    HDL 96  >62 mg/dL    Total CHOL/HDL Ratio 2.2      VLDL 15  0 - 40 mg/dL    LDL Cholesterol 96  0 - 99 mg/dL   TROPONIN I     Status: Normal   Collection Time   10/11/12  5:40 AM      Component Value Range Comment   Troponin I <0.30  <0.30 ng/mL     Dg Chest 2 View  10/10/2012  *RADIOLOGY REPORT*  Clinical Data: Chest pain possible infiltrates seen in the left lung base on portable chest radiograph performed earlier today  CHEST - 2 VIEW  Comparison: Portable chest radiograph 10/10/2012  Findings: Current radiograph is performed at 9:56 hours.  Heart size is mildly enlarged and stable.  There is atherosclerotic calcification of the thoracic aortic arch.  Stable right paratracheal contour is likely related to vascular structures or right thyroid lobe prominence, as it is unchanged dating back to chest radiograph 06/11/2004.  There is slight volume loss from atelectasis in the lateral left lung base.  No definite airspace disease is seen.  Aeration at the left lung bases improved compared to the portable chest radiograph performed earlier today.  The pleural effusion or pneumothorax.  No acute bony abnormality is identified.  IMPRESSION:  1.  Probable atelectasis and/or scarring at the left lung base.  No definite airspace disease. Improved aeration of the left lung base compared to the portable chest radiograph performed earlier today. 2.  Stable mild cardiomegaly.   Original Report Authenticated By: Britta Mccreedy, M.D.    Dg Chest Port 1 View  10/10/2012  *RADIOLOGY REPORT*  Clinical Data: Chest pain, shortness of breath  PORTABLE CHEST - 1 VIEW  Comparison: 04/21/2012  Findings: Cardiomegaly.  Study is limited by poor inspiration. There is hazy left basilar atelectasis or infiltrate.  No pulmonary edema.  Atherosclerotic calcifications of thoracic aorta.  IMPRESSION: Limited study by poor inspiration.  Hazy left basilar  atelectasis or early infiltrate.  No pulmonary edema.   Original Report Authenticated By: Natasha Mead, M.D.     Review of Systems  Constitutional: Negative for diaphoresis.  Respiratory: Negative  for shortness of breath.   Cardiovascular: Positive for chest pain.  Gastrointestinal: Negative for nausea and vomiting.  Neurological: Negative for dizziness.   Blood pressure 154/61, pulse 74, temperature 98.4 F (36.9 C), temperature source Oral, resp. rate 18, height 5' (1.524 m), weight 123 lb 3.8 oz (55.9 kg), SpO2 98.00%. Physical Exam  Constitutional: She is oriented to person, place, and time. No distress.       Frail appearing  HENT:  Head: Normocephalic and atraumatic.  Eyes: Conjunctivae normal and EOM are normal.  Neck: Normal range of motion. No JVD present.  Cardiovascular: Normal rate, regular rhythm, normal heart sounds and intact distal pulses.  Exam reveals no gallop and no friction rub.   No murmur heard. Pulses:      Radial pulses are 2+ on the right side, and 2+ on the left side.       Dorsalis pedis pulses are 2+ on the right side, and 2+ on the left side.  Respiratory: Effort normal. No respiratory distress. She has no wheezes.       Decreased breath sounds at bilateral bases  GI: Soft. Bowel sounds are normal. She exhibits no distension and no mass. There is no tenderness.  Musculoskeletal: Normal range of motion.  Neurological: She is alert and oriented to person, place, and time.  Skin: Skin is warm and dry. She is not diaphoretic.  Psychiatric: She has a normal mood and affect.    Assessment/Plan: Patient Active Hospital Problem List: NSTEMI 10/10/12 Diastolic HF (grade I on echo in 2012, EF of 40-45%) CKD HTN HLD  Plan: Initial troponins positive x 2. Initial EKG demonstrated ST depressions. Currently on IV heparin. Will need to consider NST to rule out ischemia. Her last NST was in 2009 and was low risk. She is currently CP free. SL NTG already ordered PRN.  She is also on 325 mg ASA, 40 mg of Lipitor and 150 mg of Toprol-XL daily.  BP was elevated at time of admission w/ SBPs in the 170's. BP is better controlled this am.  Last BP was 136/60. She is also on clonidine 0.2 mg for BP. HR is stable. Will continue to monitor. MD to follow with further recommendation.   SIMMONS, BRITTAINY 10/11/2012, 10:25 AM

## 2012-10-11 NOTE — Progress Notes (Signed)
  Echocardiogram 2D Echocardiogram has been performed.  Cathie Beams 10/11/2012, 12:08 PM

## 2012-10-11 NOTE — Progress Notes (Signed)
[name] BP 89/43 at 1345.  Rechecked at 144 manually BP Rt. Arm. 100/50, & Lt. Arm 92/48 .  Pt asymptomatic.  Dr. McDiarmid notified instructed will review pt's BP meds.  Will continue to monitor.  Frances [name], Charity fundraiser.

## 2012-10-11 NOTE — Progress Notes (Signed)
ANTICOAGULATION CONSULT NOTE - Follow Up Consult  Pharmacy Consult for Heparin Indication: chest pain/ACS  No Known Allergies  Patient Measurements: Height: 5' (152.4 cm) Weight: 123 lb 3.8 oz (55.9 kg) IBW/kg (Calculated) : 45.5  Heparin Dosing Weight: 55.9kg  Vital Signs: Temp: 97.3 F (36.3 C) (01/14 1345) Temp src: Oral (01/14 1345) BP: 92/48 mmHg (01/14 1444) Pulse Rate: 71  (01/14 1345)  Labs:  Basename 10/11/12 1248 10/11/12 1121 10/11/12 0540 10/10/12 2130 10/10/12 0853 10/10/12 0844  HGB -- -- 11.6* -- 13.9 --  HCT -- -- 36.8 -- 41.0 39.4  PLT -- -- 157 -- -- 135*  APTT -- -- -- -- -- --  LABPROT -- -- -- -- -- --  INR -- -- -- -- -- --  HEPARINUNFRC 0.51 -- -- -- -- --  CREATININE -- -- 3.45* -- 3.90* --  CKTOTAL -- -- -- -- -- --  CKMB -- -- -- -- -- --  TROPONINI -- <0.30 <0.30 0.33* -- --    Estimated Creatinine Clearance: 10 ml/min (by C-G formula based on Cr of 3.45).   Medications:  Heparin @ 700 units/hr  Assessment: 83yof started on heparin earlier this morning for chest pain with positive troponin x 2, though the most recent 2 troponins have been negative. Initial heparin level is at goal. Drawn ~ 1 hour early and likely reflects bolus. No bleeding reported.   No plans for cath at this point.  Goal of Therapy:  Heparin level 0.3-0.7 units/ml Monitor platelets by anticoagulation protocol: Yes   Plan:  1) Continue heparin at 700 units/hr 2) 8 hour heparin level to confirm  Fredrik Rigger 10/11/2012,3:26 PM

## 2012-10-11 NOTE — Progress Notes (Addendum)
Interim Note  Paged at [phone]. Pt with ~2.5s pause on telemetry and per report asymptomatic, lying in bed. Vitals stable with BP 107/47, pulse 61. Pt is limited code and does not desire invasive procedures/therapies. Will monitor clinically and consider EKG and further workup/therapies if pt either has continued pauses and/or if she becomes symptomatic.  Bobbye Morton, MD PGY-1, [facility] FPTS Intern pager: [phone]  Re-paged at around 0230. Pt with second pause, almost 5s in length. Pt remains asymptomatic with stable vital signs. Order placed for AM EKG. Otherwise will continue monitoring.  Bobbye Morton, MD PGY-1, [facility] FPTS Intern pager: [phone]

## 2012-10-11 NOTE — Progress Notes (Signed)
Family Medicine Teaching Service Attending Note  I interviewed and examined patient Frances [name] and reviewed their tests and x-rays.  I discussed with Dr. Madolyn Frieze and reviewed their note for today.  I agree with their assessment and plan.     Additionally  See my progress note

## 2012-10-11 NOTE — Evaluation (Signed)
Physical Therapy Evaluation Patient Details Name: Frances [name] MRN: [id number] DOB: 01/24/1929 Today's Date: 10/11/2012 Time: 7829-5621 PT Time Calculation (min): 31 min  PT Assessment / Plan / Recommendation Clinical Impression  79 y/o female admitted for chest pain and to r/o ACS. Elevated troponin which has now begun trending down. Presents to PT with generalized weakness and impaired balance affecting safety with mobility especially as she lives alone. ST-SNF has been recommended to this patient given she is a high falls risk but she laughed and will adamantly refuse placement so she will need HHPT.  Using a RW was also recommended but she again refuses this. PT to follow acutely so as to maximize safety in prep for d/c home alone.     PT Assessment  Patient needs continued PT services    Follow Up Recommendations  Home health PT;Supervision for mobility/OOB (increased hours of assist from aide)    Does the patient have the potential to tolerate intense rehabilitation      Barriers to Discharge Decreased caregiver support      Equipment Recommendations  None recommended by PT    Recommendations for Other Services OT consult   Frequency Min 3X/week    Precautions / Restrictions Precautions Precautions: Fall Restrictions Weight Bearing Restrictions: No   Pertinent Vitals/Pain SpO2 remained at or around 98% on RA during ambulation      Mobility  Bed Mobility Bed Mobility: Supine to Sit Supine to Sit: 5: Supervision;HOB elevated (20 degrees) Details for Bed Mobility Assistance: increased effort, cues for efficiency Transfers Transfers: Sit to Stand;Stand to Sit Sit to Stand: 4: Min guard;With upper extremity assist;From bed;From chair/3-in-1 Stand to Sit: 4: Min guard;With upper extremity assist;To chair/3-in-1 Details for Transfer Assistance: gaurding for safety given weakness and frailty Ambulation/Gait Ambulation/Gait Assistance: 4: Min assist Ambulation Distance  (Feet): 80 Feet Assistive device: None Ambulation/Gait Assistance Details: min stability assist especially with turns Gait Pattern: Shuffle;Trunk flexed Gait velocity: significantly decreased General Gait Details: decreased step height and length Stairs: No              PT Diagnosis: Difficulty walking;Abnormality of gait;Generalized weakness  PT Problem List: Decreased strength;Decreased activity tolerance;Decreased balance;Decreased mobility;Decreased safety awareness;Decreased knowledge of use of DME;Decreased cognition;Cardiopulmonary status limiting activity PT Treatment Interventions: DME instruction;Gait training;Functional mobility training;Therapeutic activities;Therapeutic exercise;Patient/family education;Cognitive remediation;Neuromuscular re-education;Balance training   PT Goals Acute Rehab PT Goals PT Goal Formulation: With patient Time For Goal Achievement: 10/18/12 Potential to Achieve Goals: Good Pt will go Supine/Side to Sit: with modified independence PT Goal: Supine/Side to Sit - Progress: Goal set today Pt will go Sit to Supine/Side: with modified independence PT Goal: Sit to Supine/Side - Progress: Goal set today Pt will go Sit to Stand: with modified independence PT Goal: Sit to Stand - Progress: Goal set today Pt will go Stand to Sit: with modified independence PT Goal: Stand to Sit - Progress: Goal set today Pt will Transfer Bed to Chair/Chair to Bed: with modified independence PT Transfer Goal: Bed to Chair/Chair to Bed - Progress: Goal set today Pt will Ambulate: 51 - 150 feet;with modified independence;with least restrictive assistive device;with gait velocity >(comment) ft/second (>/=1.8 ft/sec) PT Goal: Ambulate - Progress: Goal set today Pt will Go Up / Down Stairs: 3-5 stairs;with rail(s);with min assist PT Goal: Up/Down Stairs - Progress: Goal set today Pt will Perform Home Exercise Program: with supervision, verbal cues required/provided PT Goal:  Perform Home Exercise Program - Progress: Goal set today  Visit Information  Last  PT Received On: 10/11/12 Assistance Needed: +1    Subjective Data  Subjective: Im not going to a nursing home.  Patient Stated Goal: home   Prior Functioning  Home Living Lives With: Alone Available Help at Discharge: Personal care attendant (2 hours/day M-F) Type of Home: House Home Access: Stairs to enter [employer] of Steps: 3 Entrance Stairs-Rails: Right Home Layout: One level Bathroom Shower/Tub: Health visitor: Standard Home Adaptive Equipment: Bedside commode/3-in-1;Walker - rolling;Straight cane Additional Comments: reports she only uses the RW when she needs it like when she goes to the grocery store, uses her cane outdoors, has had one fall outside Prior Function Level of Independence: Independent with assistive device(s);Needs assistance Needs Assistance: Bathing;Meal Prep;Light Housekeeping Bath: Supervision/set-up Meal Prep: Moderate Light Housekeeping: Total Able to Take Stairs?: Yes Driving: No Vocation: Retired Comments: Has life alert Communication Communication: Expressive difficulties (no teeth, slurred speech, difficult to understand)    Cognition  Overall Cognitive Status: Difficult to assess Area of Impairment: Safety/judgement Difficult to assess due to: Impaired communication Arousal/Alertness: Awake/alert Orientation Level: Appears intact for tasks assessed Behavior During Session: [facility] for tasks performed Safety/Judgement: Decreased awareness of need for assistance Safety/Judgement - Other Comments: laughs at any suggestion that she is at risk for falls, laughs at any recommendations for increased safety at home    Extremity/Trunk Assessment Right Upper Extremity Assessment RUE ROM/Strength/Tone: [facility] for tasks assessed Left Upper Extremity Assessment LUE ROM/Strength/Tone: [facility] for tasks assessed Right Lower Extremity Assessment RLE  ROM/Strength/Tone: Deficits RLE ROM/Strength/Tone Deficits: generally weak, grossly 4/5 Left Lower Extremity Assessment LLE ROM/Strength/Tone: Deficits LLE ROM/Strength/Tone Deficits: generally weak, grossly 4/5 Trunk Assessment Trunk Assessment: Kyphotic   Balance Balance Balance Assessed: Yes Static Standing Balance Static Standing - Balance Support: No upper extremity supported Static Standing - Level of Assistance: 5: Stand by assistance Static Standing - Comment/# of Minutes: able to perform her pericare after toileting with knees mostly extended and trunk very flexed to reach between her legs  End of Session PT - End of Session Equipment Utilized During Treatment: Gait belt Activity Tolerance: Patient tolerated treatment well Patient left: in chair;with call bell/phone within reach Nurse Communication: Mobility status  GP     [facility] HELEN 10/11/2012, 10:18 AM

## 2012-10-11 NOTE — Progress Notes (Signed)
Subjective: She denies chest pain or difficulty breathing.  She denies medication non-compliance prior to admission.   Her speech is difficult to understand because she is not wearing teeth, however, she is appropriate to questions.   Objective: Vital signs in last 24 hours: Temp:  [98 F (36.7 C)-98.7 F (37.1 C)] 98.1 F (36.7 C) (01/14 0208) Pulse Rate:  [76-117] 77  (01/14 0354) Resp:  [16-22] 16  (01/14 0354) BP: (134-196)/(53-92) 154/58 mmHg (01/14 0354) SpO2:  [96 %-99 %] 98 % (01/14 0354) Weight:  [122 lb 12.7 oz (55.7 kg)] 122 lb 12.7 oz (55.7 kg) (01/13 1606)  Intake/Output from previous day: 01/13 0701 - 01/14 0700 In: 240 [P.O.:240] Out: 650 [Urine:650] Intake/Output this shift: Total I/O In: -  Out: 550 [Urine:550]  Physical exam: General appearance: alert, cooperative and no distress  HEENT: edentulous Back: scoliosis  Lungs: normal work of breahting, occasional cracklec and coarse breath sounds bilateral bases Chest: no chest wall tenderness.  Heart: regular rate and rhythm, S1, S2 normal, no murmur, click, rub or gallop  Extremities: extremities normal, atraumatic, no cyanosis or edema  Pulses: 2+ and symmetric  Skin: Skin color, texture, turgor normal Neurologic: Grossly normal  Labs:   Lab 10/10/12 2130 10/10/12 1551 10/10/12 0844  TROPONINI 0.33* 0.31* <0.30    Lab 10/10/12 0844  PROBNP 2199.0*    Lab 10/10/12 0853 10/10/12 0844  HGB 13.9 12.8  HCT 41.0 39.4  WBC -- 6.3  PLT -- 135*    Lab 10/10/12 0853  NA 139  K 3.9  CL 108  CO2 --  GLUCOSE 90  BUN 47*  CREATININE 3.90*  CALCIUM --  MG --  PHOS --   Studies/Results: Dg Chest 2 View  10/10/2012  *RADIOLOGY REPORT*  Clinical Data: Chest pain possible infiltrates seen in the left lung base on portable chest radiograph performed earlier today  CHEST - 2 VIEW  Comparison: Portable chest radiograph 10/10/2012  Findings: Current radiograph is performed at 9:56 hours.  Heart size is  mildly enlarged and stable.  There is atherosclerotic calcification of the thoracic aortic arch.  Stable right paratracheal contour is likely related to vascular structures or right thyroid lobe prominence, as it is unchanged dating back to chest radiograph 06/11/2004.  There is slight volume loss from atelectasis in the lateral left lung base.  No definite airspace disease is seen.  Aeration at the left lung bases improved compared to the portable chest radiograph performed earlier today.  The pleural effusion or pneumothorax.  No acute bony abnormality is identified.  IMPRESSION:  1.  Probable atelectasis and/or scarring at the left lung base.  No definite airspace disease. Improved aeration of the left lung base compared to the portable chest radiograph performed earlier today. 2.  Stable mild cardiomegaly.   Original Report Authenticated By: Britta Mccreedy, M.D.    Dg Chest Port 1 View  10/10/2012  *RADIOLOGY REPORT*  Clinical Data: Chest pain, shortness of breath  PORTABLE CHEST - 1 VIEW  Comparison: 04/21/2012  Findings: Cardiomegaly.  Study is limited by poor inspiration. There is hazy left basilar atelectasis or infiltrate.  No pulmonary edema.  Atherosclerotic calcifications of thoracic aorta.  IMPRESSION: Limited study by poor inspiration.  Hazy left basilar atelectasis or early infiltrate.  No pulmonary edema.   Original Report Authenticated By: Natasha Mead, M.D.    ECG: Rate: regular Rhythm: regular Axis: normal left-axis Intervals and complexes:   PR (normal 0.12-0.20): normal P-wave (normal <0.12 duration, 0.25 mv  amplitude): normal QRS complex (normal 0.06-0.10): normal  QT prolongation (normal men ?0.44 sec; women ?0.45 to 0.46)? Yes @ 495 (previous ECG 01/13 460s) Q waves? None ST or T-wave abnormalities? None Hypertrophy? LVH Extra or skipped beats? None Overall interpretation: normal sinus rhythm, LVH  Scheduled Meds:   . aspirin EC  81 mg Oral Daily  . calcitRIOL  0.25 mcg Oral  Daily  . cinacalcet  60 mg Oral Daily  . cloNIDine  0.2 mg Oral BID  . furosemide  40 mg Oral Once  . heparin subcutaneous  5,000 Units Subcutaneous Q8H  . metoprolol succinate  100 mg Oral Daily   Continuous Infusions:  PRN Meds:acetaminophen, acetaminophen, metoprolol, nitroGLYCERIN, traMADol  Assessment/Plan: 79 yo F with known CAD and stage 4 CKD who presents with chest pain x 30 minutes AM 01/13 that was sharp, let-sided, non-exertional, non-radiating. She was admitted to rule-out ACS. She has not had any more episodes of chest pain. However, her 2nd set of troponins was just elevated at 0.30. Her 3rd set continues to trend up and was 0.33. Telemetry has showed multiple PVCs and skipped beats throughout her hospitalization for which she has been asymptomatic.   # Chest pain. Resolved since 01/13.  # Elevated troponins without ECG changes. # History of CAD. Last cath 2005 showed no significant disease.  # Telemetry with PVCs and skipped beats.  # History of grade 1 diastolic heart failure, EF 50-55% based on 02/2011 ECHO at [facility]. # History of LVH # History of remote CVA -We had re-started her home beta-blocker on admission. Full dose aspirin, heparin gtt, O2 now. Morphine, NTG prn.  -We will request Cardiology consultation in the AM. She sees [facility] as an outpatient. -Continue to cycle troponins for another set of 3 -A1c and lipid panel this AM -Follow-up repeat ECHO>>>still needs to be performed -She does not appear fluid-overloaded at this time based on physical exam and CXR findings. Her pro-BNP is elevated but she has renal failure. Her last recorded pro-BNP in EPIC was in 2012 was 3400 and then it went down to 1540 when re-checked a few days later. Her weight is not elevated compared to past office visit weights, however, she has been endorsing weight loss to her PCP since 04/2011 at least. At that time and up until recently, she has been weighting ~145 lb; 122 lbs  now.    -We are holding her Lasix due to acute on chronic renal insufficiency.   RENAL # History of chronic kidney disease stage IV: baseline Cr 4.40-4.8; followed by a nephrologist.  Cr on admission 3.50.  # Asymptomatic UTI. Urinalysis showing moderate leuks, significant WBC and bacteria, rare squamous epi.  She is most likely colonized.  -No antibiotics at this time. Follow-up urine culture  FEN/GI # IVF: NS @  # Diet: heart healthy diet  CODE: limited; CPR, no intubation  DISPO: pending cardiac work-up # SW/CM: HH for medication assistance when she is closer to discharge.   Follow-up outpatient issues: -Weight loss     LOS: 1 day   OH PARK, ANGELA

## 2012-10-11 NOTE — Consult Note (Addendum)
Pt. Seen and examined. Agree with the NP/PA-C note as written.  79 yo female with multiple medical problems who was very somnolent when I interviewed and examined her - very difficult to understand. She had chest pain which has resolved. Her troponin was very mildly elevated, which could be a Type II MI in the setting of hypertension or related to her marked CKD.  She is not a cath candidate and does not want dialysis. We could consider a lexiscan NST for risk stratification or just recommend medical therapy - I don't really see the utility of stress testing beyond prognostication.  There is probably some mild acute on chronic combined systolic and diastolic heart failure - LE edema, BNP elevated (but hard to know how helpful this is with her CKD).   Recommend:  Add low dose imdur 15 mg daily if bp can tolerate. Resume lasix.  Will give IV dose tonight, consider BID dosing until she diureses some then back to home maintenance. No recommendation for additional stress testing.  Discontinue heparin gtts.  Will review 2D echo.  Will follow with you. Thanks for the consult.  Chrystie Nose, MD, [facility] Attending Cardiologist [facility]

## 2012-10-11 NOTE — Progress Notes (Signed)
ANTICOAGULATION CONSULT NOTE - Initial Consult  Pharmacy Consult for Heparin Indication: chest pain/ACS  No Known Allergies  Patient Measurements: Height: 5' (152.4 cm) Weight: 123 lb 3.8 oz (55.9 kg) IBW/kg (Calculated) : 45.5   Vital Signs: Temp: 98.4 F (36.9 C) (01/14 0505) Temp src: Oral (01/14 0505) BP: 154/61 mmHg (01/14 0505) Pulse Rate: 74  (01/14 0505)  Labs:  Basename 10/10/12 2130 10/10/12 1551 10/10/12 0853 10/10/12 0844  HGB -- -- 13.9 12.8  HCT -- -- 41.0 39.4  PLT -- -- -- 135*  APTT -- -- -- --  LABPROT -- -- -- --  INR -- -- -- --  HEPARINUNFRC -- -- -- --  CREATININE -- -- 3.90* --  CKTOTAL -- -- -- --  CKMB -- -- -- --  TROPONINI 0.33* 0.31* -- <0.30    Estimated Creatinine Clearance: 8.9 ml/min (by C-G formula based on Cr of 3.9).   Medical History: Past Medical History  Diagnosis Date  . CAD (coronary artery disease) 2005    Last cath in 2005 - no significant disease  . Normal echocardiogram 02/26/2011    mild findings EJ fx 55   . Hypertension   . Renal disorder   . UTI (lower urinary tract infection) 10/10/2012    Medications:  Prescriptions prior to admission  Medication Sig Dispense Refill  . aspirin 81 MG tablet Take 1 tablet (81 mg total) by mouth daily.  90 tablet  3  . calcitRIOL (ROCALTROL) 0.25 MCG capsule Take 0.25 mcg by mouth daily.       . calcium-vitamin D (OSCAL WITH D) 500-200 MG-UNIT per tablet Take 1 tablet by mouth 3 (three) times daily.      . cinacalcet (SENSIPAR) 60 MG tablet Take 60 mg by mouth daily.        . cloNIDine (CATAPRES) 0.2 MG tablet Take 1 tablet (0.2 mg total) by mouth 2 (two) times daily.  60 tablet  6  . furosemide (LASIX) 80 MG tablet Take 80 mg by mouth 2 (two) times daily.       . metoprolol succinate (TOPROL-XL) 100 MG 24 hr tablet Take 1 tablet (100 mg total) by mouth daily.  31 tablet  11  . traMADol (ULTRAM) 50 MG tablet Take 1 tablet (50 mg total) by mouth 2 (two) times daily as needed.  30  tablet  3    Assessment: 79 yo female with chest pain and elevated cardiac markers for heparin  Goal of Therapy:  Heparin level 0.3-0.7 units/ml Monitor platelets by anticoagulation protocol: Yes   Plan:  Heparin 1500 units IV bolus, then 700 units/hr Check heparin level in 8 hours.  Eddie Candle 10/11/2012,5:11 AM

## 2012-10-11 NOTE — Progress Notes (Signed)
PCP  Seen and examined She is currently without complaints and wants to go home I have known her for years and she has consistently not wanted any invasive medical care including dialysis. She is not interested in heart catheterization or surgery.    Would strongly recommend medical management with the goal being symptom control and returning her to her home where she is most comfortable  Please contact me with any questions  CHAMBLISS,MARSHALL L [phone]

## 2012-10-11 NOTE — Progress Notes (Signed)
2D echo shows newly reduced EF to <20%, global hypokinesis. Will give IV lasix tonight. She still does not want cardiac catheterization, which could likely lead to dialysis. Continue medical therapy .Marland Kitchen If she decompensates, she would be a palliative candidate given the severity of her cardiomyopathy and renal failure.  Chrystie Nose, MD, [facility] Attending Cardiologist [facility]

## 2012-10-12 DIAGNOSIS — I1310 Hypertensive heart and chronic kidney disease without heart failure, with stage 1 through stage 4 chronic kidney disease, or unspecified chronic kidney disease: Secondary | ICD-10-CM | POA: Diagnosis present

## 2012-10-12 DIAGNOSIS — I455 Other specified heart block: Secondary | ICD-10-CM | POA: Diagnosis not present

## 2012-10-12 LAB — BASIC METABOLIC PANEL
BUN: 50 mg/dL — ABNORMAL HIGH (ref 6–23)
Calcium: 9.9 mg/dL (ref 8.4–10.5)
Creatinine, Ser: 3.56 mg/dL — ABNORMAL HIGH (ref 0.50–1.10)
GFR calc Af Amer: 13 mL/min — ABNORMAL LOW (ref >90)
GFR calc non Af Amer: 11 mL/min — ABNORMAL LOW (ref >90)
Potassium: 3.6 mEq/L (ref 3.5–5.1)

## 2012-10-12 LAB — URINE CULTURE: Colony Count: >=100000

## 2012-10-12 LAB — CBC
HCT: 35.1 % — ABNORMAL LOW (ref 36.0–46.0)
MCH: 26.7 pg (ref 26.0–34.0)
MCHC: 31.3 g/dL (ref 30.0–36.0)
RDW: 13.4 % (ref 11.5–15.5)

## 2012-10-12 MED ORDER — HYDRALAZINE HCL 25 MG PO TABS
25.0000 mg | ORAL_TABLET | Freq: Three times a day (TID) | ORAL | Status: DC
  Administered 2012-10-12 – 2012-10-13 (×3): 25 mg via ORAL
  Filled 2012-10-12 (×6): qty 1

## 2012-10-12 MED ORDER — FUROSEMIDE 10 MG/ML IJ SOLN
80.0000 mg | Freq: Two times a day (BID) | INTRAMUSCULAR | Status: DC
  Administered 2012-10-12: 80 mg via INTRAVENOUS
  Filled 2012-10-12 (×3): qty 8

## 2012-10-12 NOTE — Progress Notes (Signed)
[facility]  Subjective: No CP or SOB  Objective: Vital signs in last 24 hours: Temp:  [97.2 F (36.2 C)-97.4 F (36.3 C)] 97.3 F (36.3 C) (01/15 0518) Pulse Rate:  [61-71] 63  (01/15 0518) Resp:  [18-20] 18  (01/15 0518) BP: (89-154)/(43-62) 154/62 mmHg (01/15 0518) SpO2:  [100 %] 100 % (01/15 0518) Weight:  [56.6 kg (124 lb 12.5 oz)] 56.6 kg (124 lb 12.5 oz) (01/15 0518) Last BM Date: 10/09/12  Intake/Output from previous day: 01/14 0701 - 01/15 0700 In: 498.5 [P.O.:360; I.V.:138.5] Out: 850 [Urine:850] Intake/Output this shift: Total I/O In: 240 [P.O.:240] Out: 201 [Urine:200; Stool:1]  Medications Current Facility-Administered Medications  Medication Dose Route Frequency Provider Last Rate Last Dose  . 0.9 %  sodium chloride infusion   Intravenous PRN Leighton Roach McDiarmid, MD 10 mL/hr at 10/11/12 0550    . acetaminophen (TYLENOL) tablet 650 mg  650 mg Oral Q6H PRN Dessa Phi, MD       Or  . acetaminophen (TYLENOL) suppository 650 mg  650 mg Rectal Q6H PRN Josalyn Funches, MD      . antiseptic oral rinse (BIOTENE) solution 15 mL  15 mL Mouth Rinse BID Leighton Roach McDiarmid, MD   15 mL at 10/12/12 0800  . aspirin tablet 325 mg  325 mg Oral Daily Shelly Rubenstein, MD   325 mg at 10/11/12 1045  . atorvastatin (LIPITOR) tablet 40 mg  40 mg Oral q1800 Elenora Gamma, MD   40 mg at 10/11/12 1706  . calcitRIOL (ROCALTROL) capsule 0.25 mcg  0.25 mcg Oral Daily Josalyn Funches, MD   0.25 mcg at 10/11/12 1044  . cinacalcet (SENSIPAR) tablet 60 mg  60 mg Oral Daily Josalyn Funches, MD   60 mg at 10/11/12 1044  . cloNIDine (CATAPRES) tablet 0.1 mg  0.1 mg Oral BID Leighton Roach McDiarmid, MD      . furosemide (LASIX) tablet 80 mg  80 mg Oral BID Chrystie Nose, MD   80 mg at 10/12/12 0731  . heparin injection 5,000 Units  5,000 Units Subcutaneous Q8H Leighton Roach McDiarmid, MD   5,000 Units at 10/12/12 [phone]  . isosorbide mononitrate (IMDUR) 24 hr tablet 15 mg  15  mg Oral Daily Chrystie Nose, MD      . metoprolol (LOPRESSOR) tablet 50 mg  50 mg Oral BID Leighton Roach McDiarmid, MD      . morphine 2 MG/ML injection 1 mg  1 mg Intravenous Q2H PRN Elsie [place], MD      . nitroGLYCERIN (NITROSTAT) SL tablet 0.4 mg  0.4 mg Sublingual Q5 min PRN Leighton Roach McDiarmid, MD      . sodium chloride 0.9 % injection 3 mL  3 mL Intravenous Q12H Leighton Roach McDiarmid, MD   3 mL at 10/11/12 2107  . traMADol (ULTRAM) tablet 50 mg  50 mg Oral BID PRN Dessa Phi, MD        PE: General appearance: alert, cooperative and no distress Neck: no JVD Lungs: Mild rales Heart: regular rate and rhythm, S1, S2 normal, no murmur, click, rub or gallop Extremities: No LEE Pulses: 2+ and symmetric Skin: Warm and dry  Lab Results:   Basename 10/12/12 0555 10/11/12 0540 10/10/12 0853 10/10/12 0844  WBC 4.5 4.7 -- 6.3  HGB 11.0* 11.6* 13.9 --  HCT 35.1* 36.8 41.0 --  PLT 145* 157 -- 135*   BMET  Basename 10/11/12 0540 10/10/12 1191  NA 141 139  K 4.5 3.9  CL 105 108  CO2 22 --  GLUCOSE 91 90  BUN 48* 47*  CREATININE 3.45* 3.90*  CALCIUM 9.7 --   PT/INR No results found for this basename: LABPROT:3,INR:3 in the last 72 hours Cholesterol  Basename 10/11/12 0540  CHOL 207*   Lipid Panel     Component Value Date/Time   CHOL 207* 10/11/2012 0540   TRIG 77 10/11/2012 0540   HDL 96 10/11/2012 0540   CHOLHDL 2.2 10/11/2012 0540   VLDL 15 10/11/2012 0540   LDLCALC 96 10/11/2012 0540   Cardiac Panel (last 3 results)  Basename 10/11/12 1716 10/11/12 1121 10/11/12 0540  CKTOTAL -- -- --  CKMB -- -- --  TROPONINI <0.30 <0.30 <0.30  RELINDX -- -- --   2D echo Study Conclusions  - Left ventricle: The cavity size was normal. There was moderate concentric hypertrophy. The estimated ejection fraction is <20%. Doppler parameters are consistent with abnormal left ventricular relaxation (grade 1 diastolic dysfunction). The E/e' ratio is >10, suggesting elevated LV filling  pressure. - Aortic valve: Sclerosis without stenosis. Mild regurgitation. - Mitral valve: Calcified annulus. - Left atrium: The atrium was mildly dilated. - Inferior vena cava: The vessel was normal in size; the respirophasic diameter changes were in the normal range (= 50%); findings are consistent with normal central venous pressure. - Pericardium, extracardiac: A trivial pericardial effusion was identified posterior to the heart. Features were not consistent with tamponade physiology.   Assessment/Plan  Active Problems:  * No active hospital problems. *  NSTEMI 10/10/12  Diastolic HF (grade I on echo in 2012, EF of 40-45%)  CKD  HTN  HLD Asystole/pause x 2  Plan:  The patient had two pauses in her HR around 0230hrs today, 4.07 and 4.85 seconds each.  Will discontinue BB and clonidine(0.1mg ).   HR currently 85.  Probably not a candidate for PPM.  Labile BP 89/43 - 154/62.   Last three troponins negative.   2D echo shows severely decreased LVF of 20%.  Recommend hydralazine for further afterload reduction and BP control.   Long acting nitrate and lasix given.  Fluids negative.    LOS: 2 days    Frances [name] 10/12/2012 9:12 AM

## 2012-10-12 NOTE — Progress Notes (Signed)
Family Medicine Teaching Service  Daily Progress Note  Service Page: [phone]  Subjective: 4.85 second pause on Telemetry last night. Feeling well this am.  No complaints. Denies CP, SOB, abdominal pain.  Objective: Vital signs in last 24 hours: Temp:  [97.2 F (36.2 C)-97.4 F (36.3 C)] 97.3 F (36.3 C) (01/15 0518) Pulse Rate:  [61-75] 75  (01/15 1025) Resp:  [18-20] 18  (01/15 1025) BP: (89-154)/(43-67) 124/67 mmHg (01/15 1025) SpO2:  [97 %-100 %] 97 % (01/15 1025) Weight:  [124 lb 12.5 oz (56.6 kg)] 124 lb 12.5 oz (56.6 kg) (01/15 0518)   Intake/Output Summary (Last 24 hours) at 10/12/12 1044 Last data filed at 10/12/12 0910  Gross per 24 hour  Intake 498.53 ml  Output    851 ml  Net -352.47 ml    Physical exam: General appearance: alert, cooperative and no distress  Lungs: normal work of breahting, coarse breath sounds bilateral bases Chest: no chest wall tenderness.  Heart: regular rate and rhythm, S1, S2 normal, no murmur, click, rub or gallop  Extremities: extremities normal, atraumatic, no cyanosis or edema  Neurologic: No focal deficits.  Labs:    Lab 10/11/12 1716 10/11/12 1121 10/11/12 0540 10/10/12 2130 10/10/12 1551  TROPONINI <0.30 <0.30 <0.30 0.33* 0.31*    Lab 10/10/12 0844  PROBNP 2199.0*    Lab 10/12/12 0555 10/11/12 0540 10/10/12 0853 10/10/12 0844  HGB 11.0* 11.6* 13.9 --  HCT 35.1* 36.8 41.0 --  WBC 4.5 4.7 -- 6.3  PLT 145* 157 -- 135*    Lab 10/11/12 0540 10/10/12 0853  NA 141 139  K 4.5 3.9  CL 105 108  CO2 22 --  GLUCOSE 91 90  BUN 48* 47*  CREATININE 3.45* 3.90*  CALCIUM 9.7 --  MG -- --  PHOS -- --   Studies/Results:  2D Echo Study Conclusions - Left ventricle: The cavity size was normal. There was moderate concentric hypertrophy. The estimated ejection fraction is <20%. Doppler parameters are consistent with abnormal left ventricular relaxation (grade 1 diastolic dysfunction).   Dg Chest 2 View 10/10/2012   *RADIOLOGY REPORT*  Clinical Data: Chest pain possible infiltrates seen in the left lung base on portable chest radiograph performed earlier today  CHEST - 2 VIEW  Comparison: Portable chest radiograph 10/10/2012  Findings: Current radiograph is performed at 9:56 hours.  Heart size is mildly enlarged and stable.  There is atherosclerotic calcification of the thoracic aortic arch.  Stable right paratracheal contour is likely related to vascular structures or right thyroid lobe prominence, as it is unchanged dating back to chest radiograph 06/11/2004.  There is slight volume loss from atelectasis in the lateral left lung base.  No definite airspace disease is seen.  Aeration at the left lung bases improved compared to the portable chest radiograph performed earlier today.  The pleural effusion or pneumothorax.  No acute bony abnormality is identified.  IMPRESSION:  1.  Probable atelectasis and/or scarring at the left lung base.  No definite airspace disease. Improved aeration of the left lung base compared to the portable chest radiograph performed earlier today. 2.  Stable mild cardiomegaly.   Dg Chest Port 1 View 10/10/2012  *RADIOLOGY REPORT*  Clinical Data: Chest pain, shortness of breath  PORTABLE CHEST - 1 VIEW  Comparison: 04/21/2012  Findings: Cardiomegaly.  Study is limited by poor inspiration. There is hazy left basilar atelectasis or infiltrate.  No pulmonary edema.  Atherosclerotic calcifications of thoracic aorta.  IMPRESSION: Limited study by poor inspiration.  Hazy left basilar atelectasis or early infiltrate.  No pulmonary edema.  Scheduled Meds:    . antiseptic oral rinse  15 mL Mouth Rinse BID  . aspirin  325 mg Oral Daily  . atorvastatin  40 mg Oral q1800  . calcitRIOL  0.25 mcg Oral Daily  . cinacalcet  60 mg Oral Daily  . furosemide  80 mg Intravenous BID  . heparin subcutaneous  5,000 Units Subcutaneous Q8H  . hydrALAZINE  25 mg Oral Q8H  . isosorbide mononitrate  15 mg Oral Daily    . sodium chloride  3 mL Intravenous Q12H   Continuous Infusions:  PRN Meds:acetaminophen, acetaminophen, morphine injection, nitroGLYCERIN, traMADol  Assessment/Plan: 79 yo F with known CAD and stage 4 CKD who presents with chest pain.  # CARDIO - Chest pain, CAD, HTN. - Chest pain currently resolved. - Troponins Negative (see above) and EKG showed Sinus tachycardia with ST depressions and LVH - Cardiology consulted yesterday and did not recommend intervention, only medical management at this time.  Imdur and Lasix restarted.   - Echo was obtained and revealed severely decreased EF of <20% with LVH and grade 1 diastolic dysfunction. - >4 sec pause noted on telemetry last night.   - Will continue medical management: Imdur 15 mg daily, ASA 325 mg dialy, Lipitor 40 mg.  Beta blocker discontinued given significant pause.  Clonidine also discontinued.  Hydralazine 25 mg TID added.  - Lasix increased to 80 mg IV BID.  Will diurese and reassess in the am.  RENAL - History of chronic kidney disease stage IV: baseline Cr 4.40-4.8; followed by a nephrologist.  - Creatinine on admission - 3.5.  Creatine stable at 3.56 today. - Will continue to monitor closely.  # GU - Urine Culture revealed >100,000 CFU of E. Coli - Patient is elderly and asymptomatic.  Likely colonization.  No treatment indicated at this time.  FEN/GI # Diet: heart healthy diet CODE: limited; CPR, no intubation DISPO: pending clinical improvement. PT and OT consulted. Will need continued home health services.    LOS: 2 days   Everlene Other

## 2012-10-12 NOTE — Progress Notes (Signed)
Patient evaluated for long-term disease management services with [facility] Care Management Program. Patient will receive a post discharge transition of care call and monthly home visits for assessments and for education. Met with patient at bedside who reports she lives alone and has a home health aide from [place] that stays with her 2 hrs a day, Mon- Fri. She reports she does not have any family support. Medications are delivered from [facility] per patient and also states she uses [facility] services for transportation. Consents signed at bedside. She states she plans to return home upon discharge. If she does go home she will benefit from home health services as well. [facility] Care Management will not replace or interfere any services arranged by inpatient case management or social work. Left [facility] Care Management packet at bedside. Appreciative of visit.   Raiford Noble, MSN- Ed,RN,BSN [facility] Liaison [phone]

## 2012-10-12 NOTE — Progress Notes (Signed)
0216 Was notified by monitor tech that patient had a pause of 4.85 seconds. Pt.resting in bed.Pt.denies any symptoms. VS taken and documented. MD on call for family practice was called and notified. New order. Will continue to monitor.

## 2012-10-12 NOTE — Progress Notes (Signed)
I discussed with Dr Cook.  I agree with their plans documented in their progress note for today.  

## 2012-10-12 NOTE — Progress Notes (Addendum)
Pt. Seen and examined. Agree with the NP/PA-C note as written.  Diuresed almost 1L yesterday, breathing better. Creatinine is improving, suggestive of probable cardiorenal syndrome on top of intrinsic medical renal disease. She continues to refuse cardiac catheterization and we discussed sinus pauses that she had which were significant - she does not want a pacemaker if necessary. Not an AICD candidate. Agree with medication changes, including discontinuing clonidine and b-blocker. Will manage BP with hydralazine and nitrates. Will pursue IV diuretics, given renal insufficiency, response to po diuretics has been poor today.   Chrystie Nose, MD, [facility] Attending Cardiologist [facility]

## 2012-10-12 NOTE — Progress Notes (Signed)
10/11/2012 21 Was notified by monitor tech that patient had a pause of 2.37 seconds. Pt.was in bed resting at the time. Pt.asymptomatic. VS were taken and documented on call MD with family practice was called and notified. No new orders at this time. Will continue to monitor.

## 2012-10-13 DIAGNOSIS — IMO0001 Reserved for inherently not codable concepts without codable children: Secondary | ICD-10-CM

## 2012-10-13 LAB — BASIC METABOLIC PANEL
BUN: 51 mg/dL — ABNORMAL HIGH (ref 6–23)
Calcium: 10.2 mg/dL (ref 8.4–10.5)
Calcium: 10.4 mg/dL (ref 8.4–10.5)
Creatinine, Ser: 3.62 mg/dL — ABNORMAL HIGH (ref 0.50–1.10)
Creatinine, Ser: 3.6 mg/dL — ABNORMAL HIGH (ref 0.50–1.10)
GFR calc Af Amer: 13 mL/min — ABNORMAL LOW (ref >90)
GFR calc non Af Amer: 11 mL/min — ABNORMAL LOW (ref >90)
GFR calc non Af Amer: 11 mL/min — ABNORMAL LOW (ref >90)
Sodium: 139 mEq/L (ref 135–145)

## 2012-10-13 MED ORDER — NITROGLYCERIN 0.4 MG SL SUBL: 0.4000 mg | SUBLINGUAL_TABLET | SUBLINGUAL | Status: DC | PRN

## 2012-10-13 MED ORDER — ISOSORBIDE MONONITRATE ER 30 MG PO TB24
30.0000 mg | ORAL_TABLET | Freq: Every day | ORAL | Status: DC
  Administered 2012-10-13: 30 mg via ORAL
  Filled 2012-10-13: qty 1

## 2012-10-13 MED ORDER — ISOSORBIDE MONONITRATE ER 30 MG PO TB24: 30.0000 mg | ORAL_TABLET | Freq: Every day | ORAL | Status: DC

## 2012-10-13 MED ORDER — HYDRALAZINE HCL 25 MG PO TABS
37.5000 mg | ORAL_TABLET | Freq: Three times a day (TID) | ORAL | Status: DC
  Administered 2012-10-13: 37.5 mg via ORAL
  Filled 2012-10-13 (×3): qty 1.5

## 2012-10-13 MED ORDER — FUROSEMIDE 10 MG/ML IJ SOLN
80.0000 mg | Freq: Two times a day (BID) | INTRAMUSCULAR | Status: DC
  Administered 2012-10-13: 80 mg via INTRAVENOUS
  Filled 2012-10-13: qty 8

## 2012-10-13 MED ORDER — HYDRALAZINE HCL 25 MG PO TABS: 37.5000 mg | ORAL_TABLET | Freq: Three times a day (TID) | ORAL | Status: DC

## 2012-10-13 MED ORDER — FUROSEMIDE 40 MG PO TABS: 120.0000 mg | ORAL_TABLET | Freq: Two times a day (BID) | ORAL | Status: DC

## 2012-10-13 MED ORDER — ATORVASTATIN CALCIUM 40 MG PO TABS: 40.0000 mg | ORAL_TABLET | Freq: Every day | ORAL | Status: DC

## 2012-10-13 NOTE — Discharge Summary (Addendum)
Family Medicine Teaching [facility] Discharge Summary  Patient name: Frances [name] Medical record number: [id number] Date of birth: 1929/08/31 Age: 79 y.o. 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    1) Atypical Chest pain - Chest pain resolved at time of admission - EKG revealed sinus tachycardia with diffuse ST depressions and LVH - Troponins were cycled with slight elevation noted (0.33 & 0.31).   - Concern for possible NSTEMI prompted Cardiology consult - Patient and cardiologist opted for medical management.  Medical therapy was optimized.  Patient was continued on Aspirin 325 mg daily.  Lipitor 40 mg was started during admission.  Patient was also started on Imdur/Hydralazine.   2) CAD and Sinus pause - Patient had 4.85 sec pause on telemetry during admission. - Following sinus pause, Clonidine and Metoprolol were discontinued. - Patient was continued on Aspirin therapy and Lipitor was started during admission.  3) Cardiorenal syndrome and Acute on Chronic Combined Systolic and Diastolic Congestive Heart Failure - Echo obtained during admission revealed EF less than 20% and grade 1 diastolic dysfunction - Patient was diuresed with IV Lasix with little response.  - As above medical therapy was optimized.  Beta blocker was discontinued and Imdur/hydralazine was started. - Per cardiology recommendations, lasix was increased at discharge to 120 mg mg BID.  - ACEI and ARB were not added/prescribed secondary to  CKD.   4) CKD Stage IV - V - Patient has a long history of CKD.  Patient was continued on home cinacalcet and calcitriol. - Creatinine was 3.90 on admission and remained stable during admission - Patient will need close follow up with Dr. Briant Cedar especially given increase in Lasix.  5) Asymtomatic Bacteruria - >100,000 CFU of E coli on Urine culture - Given lack of symptoms and age (likely colonized) patient was note treated.  6) HTN - Beta blocker and clonidine discontinued (see above)  - Patient started and discharged on Lasix, Imdur/hydralizine   Significant Labs and Imaging:    Lab 10/13/12 0843 10/13/12 0610 10/12/12 0933 10/11/12 0540 10/10/12 0853  BUN 53* 51* 50* 48* 47*  CREATININE 3.60* 3.62* 3.56* 3.45* 3.90*    Lab 10/11/12 1716 10/11/12 1121 10/11/12 0540 10/10/12 2130 10/10/12 1551  TROPONINI <0.30 <0.30 <0.30 0.33* 0.31*   BNP    Component Value Date/Time   PROBNP 2199.0* 10/10/2012 0844    2D Echo  Study Conclusions - Left ventricle: The cavity size was normal. There was moderate concentric hypertrophy. The estimated ejection fraction is <20%. Doppler parameters are consistent withabnormal left ventricular relaxation (grade 1 diastolic dysfunction).   Dg Chest 2 View  10/10/2012 *RADIOLOGY REPORT* Clinical Data: Chest pain possible infiltrates seen in the left lung base on portable chest radiograph performed earlier today CHEST - 2 VIEW Comparison: Portable chest radiograph 10/10/2012 Findings: Current radiograph is performed at 9:56 hours. Heart size is mildly enlarged and stable. There is atherosclerotic calcification of the thoracic aortic arch. Stable right paratracheal contour is likely related to vascular structures or right thyroid lobe prominence, as it is unchanged dating back  to chest radiograph 06/11/2004. There is slight volume loss from atelectasis in the lateral left lung base. No definite airspace disease is seen. Aeration at the left lung bases  improved compared to the portable chest radiograph performed earlier today. The pleural effusion or pneumothorax. No acute bony abnormality is identified. IMPRESSION: 1. Probable atelectasis and/or scarring at the left lung base. No definite airspace disease. Improved aeration of the left lung base compared to the portable chest radiograph performed earlier today. 2. Stable mild cardiomegaly.   Dg Chest Port 1 View  10/10/2012 *RADIOLOGY REPORT* Clinical Data: Chest pain, shortness of breath PORTABLE CHEST - 1 VIEW Comparison: 04/21/2012 Findings: Cardiomegaly. Study is limited by poor inspiration. There is hazy left basilar atelectasis or infiltrate. No pulmonary edema. Atherosclerotic calcifications of thoracic aorta. IMPRESSION: Limited study by poor inspiration. Hazy left basilar atelectasis or early infiltrate. No pulmonary edema.  Procedures: None  Consultations: Cardiology, Dr. Herbie Baltimore  Discharge Medications:    Medication List     As of 10/13/2012  1:43 PM    STOP taking these medications         cloNIDine 0.2 MG tablet   Commonly known as: CATAPRES      metoprolol succinate 100 MG 24 hr tablet   Commonly known as: TOPROL-XL      TAKE these medications         aspirin 81 MG tablet   Take 1 tablet (81 mg total) by mouth daily.      atorvastatin 40 MG tablet   Commonly known as: LIPITOR   Take 1 tablet (40 mg total) by mouth at bedtime.      calcitRIOL 0.25 MCG capsule   Commonly known as: ROCALTROL   Take 0.25 mcg by mouth daily.      calcium-vitamin D 500-200 MG-UNIT per tablet   Commonly known as: OSCAL WITH D   Take 1 tablet by mouth 3 (three) times daily.      cinacalcet 60 MG tablet   Commonly known as: SENSIPAR   Take 60 mg by mouth daily.      furosemide 40 MG tablet   Commonly known as: LASIX   Take 3 tablets (120 mg total) by mouth 2 (two) times daily.      hydrALAZINE 25 MG tablet   Commonly known as: APRESOLINE   Take 1.5 tablets (37.5 mg total) by  mouth every 8 (eight) hours.      isosorbide mononitrate 30 MG 24 hr tablet   Commonly known as: IMDUR   Take 1 tablet (30 mg total) by mouth daily.      nitroGLYCERIN 0.4 MG SL tablet   Commonly known as: NITROSTAT   Place 1 tablet (0.4 mg total) under the tongue every 5 (five) minutes as needed for chest pain.      traMADol 50 MG tablet   Commonly known as: ULTRAM   Take 1 tablet (50 mg total) by mouth 2 (two) times daily as needed.        Issues for Follow Up:  1) Titration of Lasix 2) Recommend BMP at follow up to reassess creatinine  Outstanding Results: None  Discharge Instructions: Patient was counseled important signs and symptoms that should prompt return to medical care, changes in medications, dietary instructions, activity restrictions, and follow up appointments.    Follow-up Information    Follow up with Carney Living, MD. On 10/17/2012. (3 pm)    Contact information:   [address] [place] [address] [phone]  Follow up with Dyke Maes, MD. On 10/24/2012. (10am)    Contact information:   [address] [place] [address] [phone]       Follow up with Marykay Lex, MD. On 10/26/2012. (9:30 am)    Contact information:   [address] [place] [address] [phone]          Discharge Condition: Stable. Discharged home with home health.  Everlene Other, DO 10/13/2012, 1:43 PM

## 2012-10-13 NOTE — Care Management Note (Signed)
    Page 1 of 2   10/13/2012     5:18:47 PM   CARE MANAGEMENT NOTE 10/13/2012  Patient:  Frances [name], Frances [name]   Account Number:  [id number]  Date Initiated:  10/13/2012  Documentation initiated by:  Tera Mater  Subjective/Objective Assessment:   79yo female admitted with Chest Pain.  Pt. lives alone, however has a caregiver for personal care services thru [facility].     Action/Plan:   Discharge planning for disposition   Anticipated DC Date:  10/13/2012   Anticipated DC Plan:  HOME W HOME HEALTH SERVICES  In-house referral  Clinical Social Worker      DC Planning Services  CM consult      Choice offered to / List presented to:  C-1 Patient        HH arranged  HH-1 RN  HH-2 PT  HH-3 OT      [facility] agency  [facility]   Status of service:  Completed, signed off Medicare Important Message given?   (If response is "NO", the following Medicare IM given date fields will be blank) Date Medicare IM given:   Date Additional Medicare IM given:    Discharge Disposition:  HOME W HOME HEALTH SERVICES  Per UR Regulation:  Reviewed for med. necessity/level of care/duration of stay  If discussed at Long Length of Stay Meetings, dates discussed:    Comments:  10/13/12 1545 In to speak with pt. about [facility] services, and give choice of agencies. Pt. chose [facility].  TC to Lupita Leash, with [facility] to give referral for [facility] RN, [facility] PT/OT. Pt. to dc home today.  Faxed discharge summary to [facility] [phone]).  Pt. will need transportation home.  Lupita Leash, CSW consulted for ambulance transportation home. Tera Mater, RN, BSN NCM [phone]

## 2012-10-13 NOTE — Progress Notes (Signed)
Case Manager spoke with [facility] agency and stated they will send transport to pick her up.  Pt made aware.  D/C instruction explained and given to pt's care taker Anttoinete as instructed by pt.  W

## 2012-10-13 NOTE — Progress Notes (Signed)
PT Cancellation Note  Patient Details Name: LIDDIE CHICHESTER MRN: [id number] DOB: 22-Feb-1929   Cancelled Treatment:    Reason Eval/Treat Not Completed: Other (comment) (Patient ready for discharge.)  Discussed follow up needs.  States doesn't have a walker, doesn't feel she needs one.  Did state she has a wrist safety button bracelet.  Encouraged her to always have it with her.  Also discussed HHPT.  Noted order written for HHPT.   Jenisse Vullo,CYNDI 10/13/2012, 3:35 PM

## 2012-10-13 NOTE — Progress Notes (Signed)
CSW contacted to arrange transportation for patient to return home today. She has [facility] for in home care but the worker cannot transport patient.  The agency contacted [facility] but they cannot transport patient home because they did not bring her to the hospital- thus unable to bill medicaid. Patient does not have any family or friends to transport and cannot walk distance from cab.  Discussed with patient that insurance mostly likely will not pay for transport home. Patient verbalized understanding but does not have any other means of transport home. Notified pt's nurse Nellie of above.  No further CSW needs identified.  CSW signing off.   Lorri Frederick. [place]  [phone]

## 2012-10-13 NOTE — Progress Notes (Signed)
I have seen and evaluated the patient this AM along with Corine Shelter, PA. I agree with his findings, examination as well as impression recommendations.  Strangely enough, Mrs. Dowe actually looks well.  Lungs are relatively clear with minimal rhonchi that may well be her baseline.  Her edema is minimal and renal function also seems stable.  She certainly has Tachy-Brady syndrome -- now in Sinus Tachy with frequent ectopy, while earlier today had a near 4 second pause.  Her EF is significantly reduced from her most recent Echo with severe global Hypokinesis & EF of <20% -- which would mean consideration of PPM vs. AICD is warranted.  However, she is quite clear that she does not want a PPM or AICD & is not interested in [facility] either -- the potential of HD is almost a prohibitive risk anyway.  She made her wishes pretty clear to me that she would not want CPR or shocks/defibrillation.  Unfortunately, she is very difficult to understand & this discussion is quite laborious.  She also notes that she would quite likely not want to return to the hospital in the future.  I was clear in explaining to her that she has a high likelihood of Syncope & fall as well as cardiac arrest - sudden cardiac death (either from complete HB or VF) -- she acknowledged this potentiality.  I would strongly consider Palliative Care consideration to solidify goals of care & intentions for future potential hospitalizations, as she seems adamant about not coming back to the hospital & is OK with letting Ashby Dawes (God) take its course.  Her nephrologist is Dr. Briant Cedar - who has been vital in setting her diuretic dose.  Her I/Os are clearly not accurately recorded & I am not sure we can trust her weights.  She can probably be converted to PO Lasix at ~120mg  bid (but would discuss with Dr. Briant Cedar).  I have gradually increased her Hydralazine/Nitrate combination =- would avoid BB or diltiazem.  Marykay Lex, M.D., M.S. [facility]  [facility] [address] [place], [place]  [address]  [phone] Pager # [phone] 10/13/2012 10:33 AM

## 2012-10-13 NOTE — Progress Notes (Signed)
Family Medicine Teaching Service Attending Note  I interviewed and examined patient Glas and reviewed their tests and x-rays.  I discussed with Dr. Adriana Simas and reviewed their note for today.  I agree with their assessment and plan.     Additionally  Feels well Really wants to go home Please discharge her immediately to follow up with me in the [facility]

## 2012-10-13 NOTE — Progress Notes (Signed)
Subjective:  Up in chair. Looked comfortable. No complaints of chest pain. She lives alone.  Objective:  Vital Signs in the last 24 hours: Temp:  [97.4 F (36.3 C)-98.3 F (36.8 C)] 97.6 F (36.4 C) (01/16 0522) Pulse Rate:  [68-81] 81  (01/16 0522) Resp:  [16-19] 16  (01/16 0522) BP: (119-157)/(49-79) 157/56 mmHg (01/16 0522) SpO2:  [97 %-99 %] 99 % (01/16 0522) Weight:  [55.9 kg (123 lb 3.8 oz)] 55.9 kg (123 lb 3.8 oz) (01/16 0522)  Intake/Output from previous day:  Intake/Output Summary (Last 24 hours) at 10/13/12 0454 Last data filed at 10/13/12 0850  Gross per 24 hour  Intake    728 ml  Output    476 ml  Net    252 ml    Physical Exam: General appearance: alert, cooperative, no distress and elderly, frail Lungs: few basilar rhonchi Heart: regular rate and rhythm   Rate: 80  Rhythm: normal sinus rhythm, one 3.4 second pause  Lab Results:  Basename 10/12/12 0555 10/11/12 0540  WBC 4.5 4.7  HGB 11.0* 11.6*  PLT 145* 157    Basename 10/13/12 0610 10/12/12 0933  NA 138 137  K 5.1 3.6  CL 103 102  CO2 20 22  GLUCOSE 97 132*  BUN 51* 50*  CREATININE 3.62* 3.56*    Basename 10/11/12 1716 10/11/12 1121  TROPONINI <0.30 <0.30   Hepatic Function Panel No results found for this basename: PROT,ALBUMIN,AST,ALT,ALKPHOS,BILITOT,BILIDIR,IBILI in the last 72 hours  Basename 10/11/12 0540  CHOL 207*   No results found for this basename: INR in the last 72 hours  Imaging: Imaging results have been reviewed  Cardiac Studies:  Assessment/Plan:   Principal Problem:  *Congestive dilated cardiomyopathy, EF < 20%  Active Problems:  HYPERTENSION, BENIGN SYSTEMIC  CHRONIC KIDNEY DISEASE STAGE IV (SEVERE)  At high risk for falls  Cardiorenal syndrome with renal failure  Sinus pause, refuses pacemaker/ICD  EDEMA-LEGS,DUE TO VENOUS OBSTRUCT.  Normal coronary arteries 2005   Plan- No significant CHF on exam. No change in wgt.    Corine Shelter PA-C 10/13/2012,  9:23 AM

## 2012-10-13 NOTE — Progress Notes (Signed)
Family Medicine Teaching Service  Daily Progress Note  Service Page: [phone]  Subjective: Feeling well this am.  No complaints. Denies CP, SOB, abdominal pain.  Objective: Vital signs in last 24 hours: Temp:  [97.4 F (36.3 C)-98.3 F (36.8 C)] 97.6 F (36.4 C) (01/16 0522) Pulse Rate:  [68-81] 81  (01/16 0522) Resp:  [16-19] 16  (01/16 0522) BP: (119-157)/(49-79) 157/56 mmHg (01/16 0522) SpO2:  [97 %-99 %] 99 % (01/16 0522) Weight:  [123 lb 3.8 oz (55.9 kg)] 123 lb 3.8 oz (55.9 kg) (01/16 0522)  Wt Readings from Last 3 Encounters:  10/13/12 123 lb 3.8 oz (55.9 kg)  04/11/12 135 lb (61.236 kg)  01/04/12 142 lb (64.411 kg)    Intake/Output Summary (Last 24 hours) at 10/13/12 0658 Last data filed at 10/13/12 0500  Gross per 24 hour  Intake    728 ml  Output    478 ml  Net    250 ml    Physical exam: General appearance: alert, cooperative and no distress  Lungs: normal work of breahting Chest: no chest wall tenderness.  Heart: regular rate and rhythm, S1, S2 normal, no murmur, click, rub or gallop  Extremities: extremities normal, atraumatic, no cyanosis or edema  Neurologic: No focal deficits.  Labs:    Lab 10/11/12 1716 10/11/12 1121 10/11/12 0540 10/10/12 2130 10/10/12 1551  TROPONINI <0.30 <0.30 <0.30 0.33* 0.31*    Lab 10/10/12 0844  PROBNP 2199.0*    Lab 10/12/12 0555 10/11/12 0540 10/10/12 0853 10/10/12 0844  HGB 11.0* 11.6* 13.9 --  HCT 35.1* 36.8 41.0 --  WBC 4.5 4.7 -- 6.3  PLT 145* 157 -- 135*    Lab 10/12/12 0933 10/11/12 0540 10/10/12 0853  NA 137 141 139  K 3.6 4.5 --  CL 102 105 108  CO2 22 22 --  GLUCOSE 132* 91 90  BUN 50* 48* 47*  CREATININE 3.56* 3.45* 3.90*  CALCIUM 9.9 9.7 --  MG -- -- --  PHOS -- -- --   Studies/Results:  2D Echo Study Conclusions - Left ventricle: The cavity size was normal. There was moderate concentric hypertrophy. The estimated ejection fraction is <20%. Doppler parameters are consistent  with abnormal left ventricular relaxation (grade 1 diastolic dysfunction).  Dg Chest 2 View 10/10/2012  *RADIOLOGY REPORT*  Clinical Data: Chest pain possible infiltrates seen in the left lung base on portable chest radiograph performed earlier today  CHEST - 2 VIEW  Comparison: Portable chest radiograph 10/10/2012  Findings: Current radiograph is performed at 9:56 hours.  Heart size is mildly enlarged and stable.  There is atherosclerotic calcification of the thoracic aortic arch.  Stable right paratracheal contour is likely related to vascular structures or right thyroid lobe prominence, as it is unchanged dating back to chest radiograph 06/11/2004.  There is slight volume loss from atelectasis in the lateral left lung base.  No definite airspace disease is seen.  Aeration at the left lung bases improved compared to the portable chest radiograph performed earlier today.  The pleural effusion or pneumothorax.  No acute bony abnormality is identified.  IMPRESSION:  1.  Probable atelectasis and/or scarring at the left lung base.  No definite airspace disease. Improved aeration of the left lung base compared to the portable chest radiograph performed earlier today. 2.  Stable mild cardiomegaly.   Dg Chest Port 1 View 10/10/2012  *RADIOLOGY REPORT*  Clinical Data: Chest pain, shortness of breath  PORTABLE CHEST - 1 VIEW  Comparison: 04/21/2012  Findings:  Cardiomegaly.  Study is limited by poor inspiration. There is hazy left basilar atelectasis or infiltrate.  No pulmonary edema.  Atherosclerotic calcifications of thoracic aorta.  IMPRESSION: Limited study by poor inspiration.  Hazy left basilar atelectasis or early infiltrate.  No pulmonary edema.  Scheduled Meds:    . antiseptic oral rinse  15 mL Mouth Rinse BID  . aspirin  325 mg Oral Daily  . atorvastatin  40 mg Oral q1800  . calcitRIOL  0.25 mcg Oral Daily  . cinacalcet  60 mg Oral Daily  . furosemide  80 mg Intravenous BID  . heparin subcutaneous   5,000 Units Subcutaneous Q8H  . hydrALAZINE  25 mg Oral Q8H  . isosorbide mononitrate  15 mg Oral Daily  . sodium chloride  3 mL Intravenous Q12H   Continuous Infusions:  PRN Meds:acetaminophen, acetaminophen, morphine injection, nitroGLYCERIN, traMADol  Assessment/Plan: 79 yo F with known CAD and stage 4 CKD who presents with chest pain.  # CARDIO - Chest pain, CAD, HTN. - Chest pain currently resolved. - Troponins Negative (see above) and EKG showed Sinus tachycardia with ST depressions and LVH - Cardiology consulted and did not recommend intervention, only medical management at this time.  Imdur and Lasix restarted.   - Echo was obtained and revealed severely decreased EF of <20% with LVH and grade 1 diastolic dysfunction. - 1/14 - 4 sec pause on telemetry - Beta blocker and Clonidine stopped. - Will continue medical management: Imdur/Hydralazine, ASA 325 mg dialy, Lipitor 40 mg.   - Will continue IV Lasix 80 mg BID.  Net positive over last 24 hours (however, patient had 2 unmeasured urine outputs).  RENAL - History of chronic kidney disease stage IV: baseline Cr 4.40-4.8; followed by a nephrologist.  - Creatinine on admission - 3.5.  Creatine stable at 3.56 today. - Will continue to monitor closely.  # GU - Urine Culture revealed >100,000 CFU of E. Coli - Patient is elderly and asymptomatic.  Likely colonization.  No treatment indicated at this time.  FEN/GI # Diet: heart healthy diet CODE: limited; CPR, no intubation DISPO: pending clinical improvement. Will need continued home health services.    LOS: 3 days   Everlene Other

## 2012-10-13 NOTE — Evaluation (Signed)
Occupational Therapy Evaluation Patient Details Name: Frances [name] MRN: [id number] DOB: 15-Apr-1929 Today's Date: 10/13/2012 Time: 4098-1191 OT Time Calculation (min): 17 min  OT Assessment / Plan / Recommendation Clinical Impression  Pt admitted with CP found to be Congestive dilated cardiomyopathy, EF < 20%. Pt near baseline functioning with generalized weakness and some intermittent confusion. Pt will benefit from skilled OT in the acute setting to maximize I with ADL and ADL mobility prior to d/c.    OT Assessment  Patient needs continued OT Services    Follow Up Recommendations  Home health OT;Supervision - Intermittent    Barriers to Discharge      Equipment Recommendations  None recommended by OT    Recommendations for Other Services    Frequency  Min 2X/week    Precautions / Restrictions Precautions Precautions: Fall   Pertinent Vitals/Pain Pt denies any pain at this time    ADL  Grooming: Wash/dry face;Wash/dry hands;Supervision/safety Where Assessed - Grooming: Unsupported standing Lower Body Dressing: Min guard Where Assessed - Lower Body Dressing: Unsupported sit to stand Toilet Transfer: Min Pension scheme manager Method: Sit to Barista: Bedside commode Toileting - Architect and Hygiene: Min guard Where Assessed - Engineer, mining and Hygiene: Sit to stand from 3-in-1 or toilet Tub/Shower Transfer: Minimal assistance Tub/Shower Transfer Method: Science writer: Shower seat with back Equipment Used: Gait belt Transfers/Ambulation Related to ADLs: Min guard A with ambulation throughout room and hallway. Pt states she usually uses a cane for indoor ambulation and RW for further distances    OT Diagnosis: Generalized weakness  OT Problem List: Decreased activity tolerance;Decreased knowledge of use of DME or AE;Decreased safety awareness;Decreased knowledge of precautions OT Treatment  Interventions: Self-care/ADL training;DME and/or AE instruction;Therapeutic activities;Patient/family education;Balance training   OT Goals Acute Rehab OT Goals OT Goal Formulation: With patient Time For Goal Achievement: 10/20/12 Potential to Achieve Goals: Good ADL Goals Pt Will Perform Grooming: with modified independence;Standing at sink;Sitting at sink ADL Goal: Grooming - Progress: Goal set today Pt Will Perform Upper Body Dressing: with supervision;with set-up ADL Goal: Upper Body Dressing - Progress: Goal set today Pt Will Perform Lower Body Dressing: with supervision;with set-up ADL Goal: Lower Body Dressing - Progress: Goal set today Pt Will Transfer to Toilet: with modified independence;Ambulation ADL Goal: Toilet Transfer - Progress: Goal set today Pt Will Perform Toileting - Clothing Manipulation: Independently;Standing ADL Goal: Toileting - Clothing Manipulation - Progress: Goal set today  Visit Information  Last OT Received On: 10/13/12 Assistance Needed: +1    Subjective Data  Subjective: "I'm doing alright." Patient Stated Goal: Return home   Prior Functioning     Home Living Lives With: Alone Available Help at Discharge: Personal care attendant (2 hours/day M-F) Type of Home: House Home Access: Stairs to enter [employer] of Steps: 3 Entrance Stairs-Rails: Right Home Layout: One level Bathroom Shower/Tub: Health visitor: Standard Home Adaptive Equipment: Bedside commode/3-in-1;Walker - rolling;Straight cane Additional Comments: reports she only uses the RW when she needs it like when she goes to the grocery store, uses her cane outdoors, has had one fall outside Prior Function Level of Independence: Independent with assistive device(s);Needs assistance Needs Assistance: Bathing;Meal Prep;Light Housekeeping Bath: Supervision/set-up Meal Prep: Moderate Light Housekeeping: Total Able to Take Stairs?: Yes Driving:  No Vocation: Retired Musician: Expressive difficulties (no teeth, slurred speech, difficult to understand)         Vision/Perception Vision - Assessment Additional Comments: 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 Eyes tend to return to the far right frequently. Pt is able to focus briefly (with nystagmus noted) before eyes move to the right.    Cognition  Arousal/Alertness: Awake/alert Orientation Level: Appears intact for tasks assessed Behavior During Session: [facility] for tasks performed Cognition - Other Comments: Pt insisted she was returned to a different chair than where she started out. Took multiple attempts to convince pt this was not true.     Extremity/Trunk Assessment Right Upper Extremity Assessment RUE ROM/Strength/Tone: [facility] for tasks assessed Left Upper Extremity Assessment LUE ROM/Strength/Tone: [facility] for tasks assessed     Mobility       Shoulder Instructions     Exercise     Balance     End of Session OT - End of Session Equipment Utilized During Treatment: Gait belt Activity Tolerance: Patient tolerated treatment well Patient left: in chair;with call bell/phone within reach;with nursing in room Nurse Communication: Mobility status  GO     Rocko Fesperman 10/13/2012, 9:47 AM

## 2012-10-17 ENCOUNTER — Encounter: Payer: Self-pay | Admitting: Family Medicine

## 2012-10-17 ENCOUNTER — Ambulatory Visit (INDEPENDENT_AMBULATORY_CARE_PROVIDER_SITE_OTHER): Payer: Medicare Other | Admitting: Family Medicine

## 2012-10-17 VITALS — BP 153/74 | HR 61 | Temp 97.8°F | Ht 60.0 in | Wt 124.0 lb

## 2012-10-17 DIAGNOSIS — I455 Other specified heart block: Secondary | ICD-10-CM

## 2012-10-17 DIAGNOSIS — I10 Essential (primary) hypertension: Secondary | ICD-10-CM

## 2012-10-17 DIAGNOSIS — I871 Compression of vein: Secondary | ICD-10-CM

## 2012-10-17 DIAGNOSIS — R634 Abnormal weight loss: Secondary | ICD-10-CM

## 2012-10-17 DIAGNOSIS — N184 Chronic kidney disease, stage 4 (severe): Secondary | ICD-10-CM

## 2012-10-17 LAB — BASIC METABOLIC PANEL
Calcium: 11.4 mg/dL — ABNORMAL HIGH (ref 8.4–10.5)
Potassium: 4.4 mEq/L (ref 3.5–5.3)
Sodium: 138 mEq/L (ref 135–145)

## 2012-10-17 NOTE — Progress Notes (Signed)
Utilization Review Completed.   Marilynne Dupuis, RN, BSN Nurse Case Manager  [phone]  

## 2012-10-17 NOTE — Assessment & Plan Note (Signed)
Stable on current dose of lasix. Will check labs

## 2012-10-17 NOTE — Assessment & Plan Note (Signed)
Continues - most likely related to progressive renal failure.  She does not want any treatment for this or any further work up

## 2012-10-17 NOTE — Progress Notes (Signed)
  Subjective:    Patient ID: Frances [name], female    DOB: [birth year], 79 y.o.   MRN: [id number]  HPI Follow up hospitalization  Chest pain None since discharge. No shortness of breath or lightheadness or chest pain walking into office or at home.  Taking imdur regularly.  Off beta blocker and clonidine.  Has not used SL nitro   Edema/Renal Failure Taking lasix regularly.  Mild edema or shortness of breath  No pruritis   Weight loss Continues to lose weight.  Does not feel like eating.  No abdomen pain or vomiting or diarrhea or fever   Review of Systems     Objective:   Physical Exam  Alert no acute distress Lungs:  Normal respiratory effort, chest expands symmetrically. Lungs are clear to auscultation, no crackles or wheezes. Heart - regular rhytym with frequent skipped beat Extrem - mild nonpitting edema Abdomen - soft nontender no evident masses      Assessment & Plan:

## 2012-10-17 NOTE — Assessment & Plan Note (Signed)
No symptoms currently.

## 2012-10-17 NOTE — Patient Instructions (Addendum)
Keep taking all the medications like you are  I will call you if your tests are not good.  Otherwise I will send you a letter.  If you do not hear from me with in 2 weeks please call our office.     Call me if you need more pain medications

## 2012-10-18 ENCOUNTER — Encounter: Payer: Self-pay | Admitting: Family Medicine

## 2012-10-18 ENCOUNTER — Telehealth: Payer: Self-pay | Admitting: Family Medicine

## 2012-10-18 NOTE — Telephone Encounter (Signed)
Creatinine increased and weight decresed likely due to overdiuresis.   Called her and she feels well  Asked her to take Lasix 40 mg 1 tab twice daily instead of 3 tabs twice daily   She agrees and will call me if increased edema or shortness of breath.  She will see Dr Briant Cedar on 1-27

## 2012-10-20 ENCOUNTER — Telehealth: Payer: Self-pay | Admitting: Family Medicine

## 2012-10-20 NOTE — Telephone Encounter (Signed)
PCS form placed in Dr. Deirdre Priest box for completion.  Kathrine Cords, Nori Riis

## 2012-10-20 NOTE — Telephone Encounter (Signed)
Form dropped off to be filled out for [facility].  Please call social worker when completed.  [phone]

## 2012-10-21 DIAGNOSIS — I495 Sick sinus syndrome: Secondary | ICD-10-CM | POA: Insufficient documentation

## 2012-10-21 NOTE — Telephone Encounter (Signed)
Placed in to be called box

## 2012-10-27 ENCOUNTER — Telehealth: Payer: Self-pay | Admitting: Family Medicine

## 2012-10-27 NOTE — Telephone Encounter (Signed)
Needs verbal orders for 2 more weeks of nursing instruction from [facility]

## 2012-10-28 NOTE — Telephone Encounter (Signed)
2 more weeks of nursing instructions  -- Make it So!!  Thanks

## 2012-10-28 NOTE — Telephone Encounter (Signed)
Gave verbal orders to Robin.  FYI TO MD: Karin Lieu states that pts cardiologist put her on metoprolol 25mg  BID.  And also pt wants take ALL meds at one time during the day.  Zella Ball is working with pt about this and is attempting to find her a pill box. Kassi Esteve, Maryjo Rochester

## 2012-11-07 ENCOUNTER — Other Ambulatory Visit: Payer: Self-pay | Admitting: *Deleted

## 2012-11-07 ENCOUNTER — Other Ambulatory Visit: Payer: Self-pay | Admitting: Family Medicine

## 2012-11-07 MED ORDER — ATORVASTATIN CALCIUM 40 MG PO TABS: 40.0000 mg | ORAL_TABLET | Freq: Every day | ORAL | Status: DC

## 2012-12-01 IMAGING — NM NM PULMONARY VENT & PERF
2 series · 12 of 12 positions shown · non-contrast
Comparison: Chest x-ray 03/26/2011

CLINICAL DATA: Shortness of breath.  Elevated creatinine.

NUCLEAR MEDICINE VENTILATION - PERFUSION LUNG SCAN
TECHNIQUE: Wash-in, equilibrium, and wash-out phase ventilation
images were obtained using Ze-MDD gas.  Perfusion images were
obtained in multiple projections after intravenous injection of Tc-
99m MAA.
Radiopharmaceuticals:  10.0 mCi Ze-MDD gas and 6.2 mCi 4c-44m MAA.

[vq scan · 2.52mm/px · 6 of 20 frames shown (1 of 2)]
[frame 2/20  full-range]
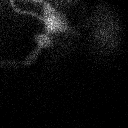
[frame 5/20]
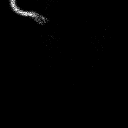
[frame 9/20]
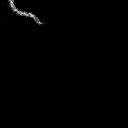
[frame 12/20]
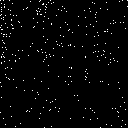
[frame 15/20]
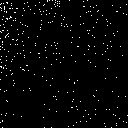
[frame 19/20]
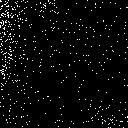

[vq scan · 2.52mm/px · 6 of 20 frames shown (2 of 2)]
[frame 2/20  full-range]
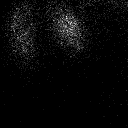
[frame 5/20]
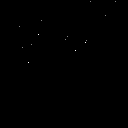
[frame 9/20]
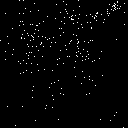
[frame 12/20]
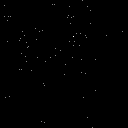
[frame 15/20]
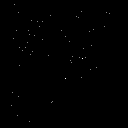
[frame 19/20]
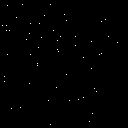

[12 of 12 positions shown; findings below may reference images not displayed]

FINDINGS: Ventilation portion of the study is unremarkable.

On the perfusion portion of the study, there is a defect over the
right apex on certain images due to the patient's chin.  This over
the left apex on other images.  I see no other ventilation
perfusion mismatches to suggest pulmonary embolus.
IMPRESSION: Defect over the upper lobes is transient and felt to be related to
patient's chin.  Low probability study for pulmonary embolus.

## 2012-12-01 IMAGING — CR DG CHEST 1V PORT
1 series · 1 of 1 positions shown · non-contrast
Comparison: 04/17/2008

CLINICAL DATA: Short of breath, no chest pain

PORTABLE CHEST - 1 VIEW

[AP]
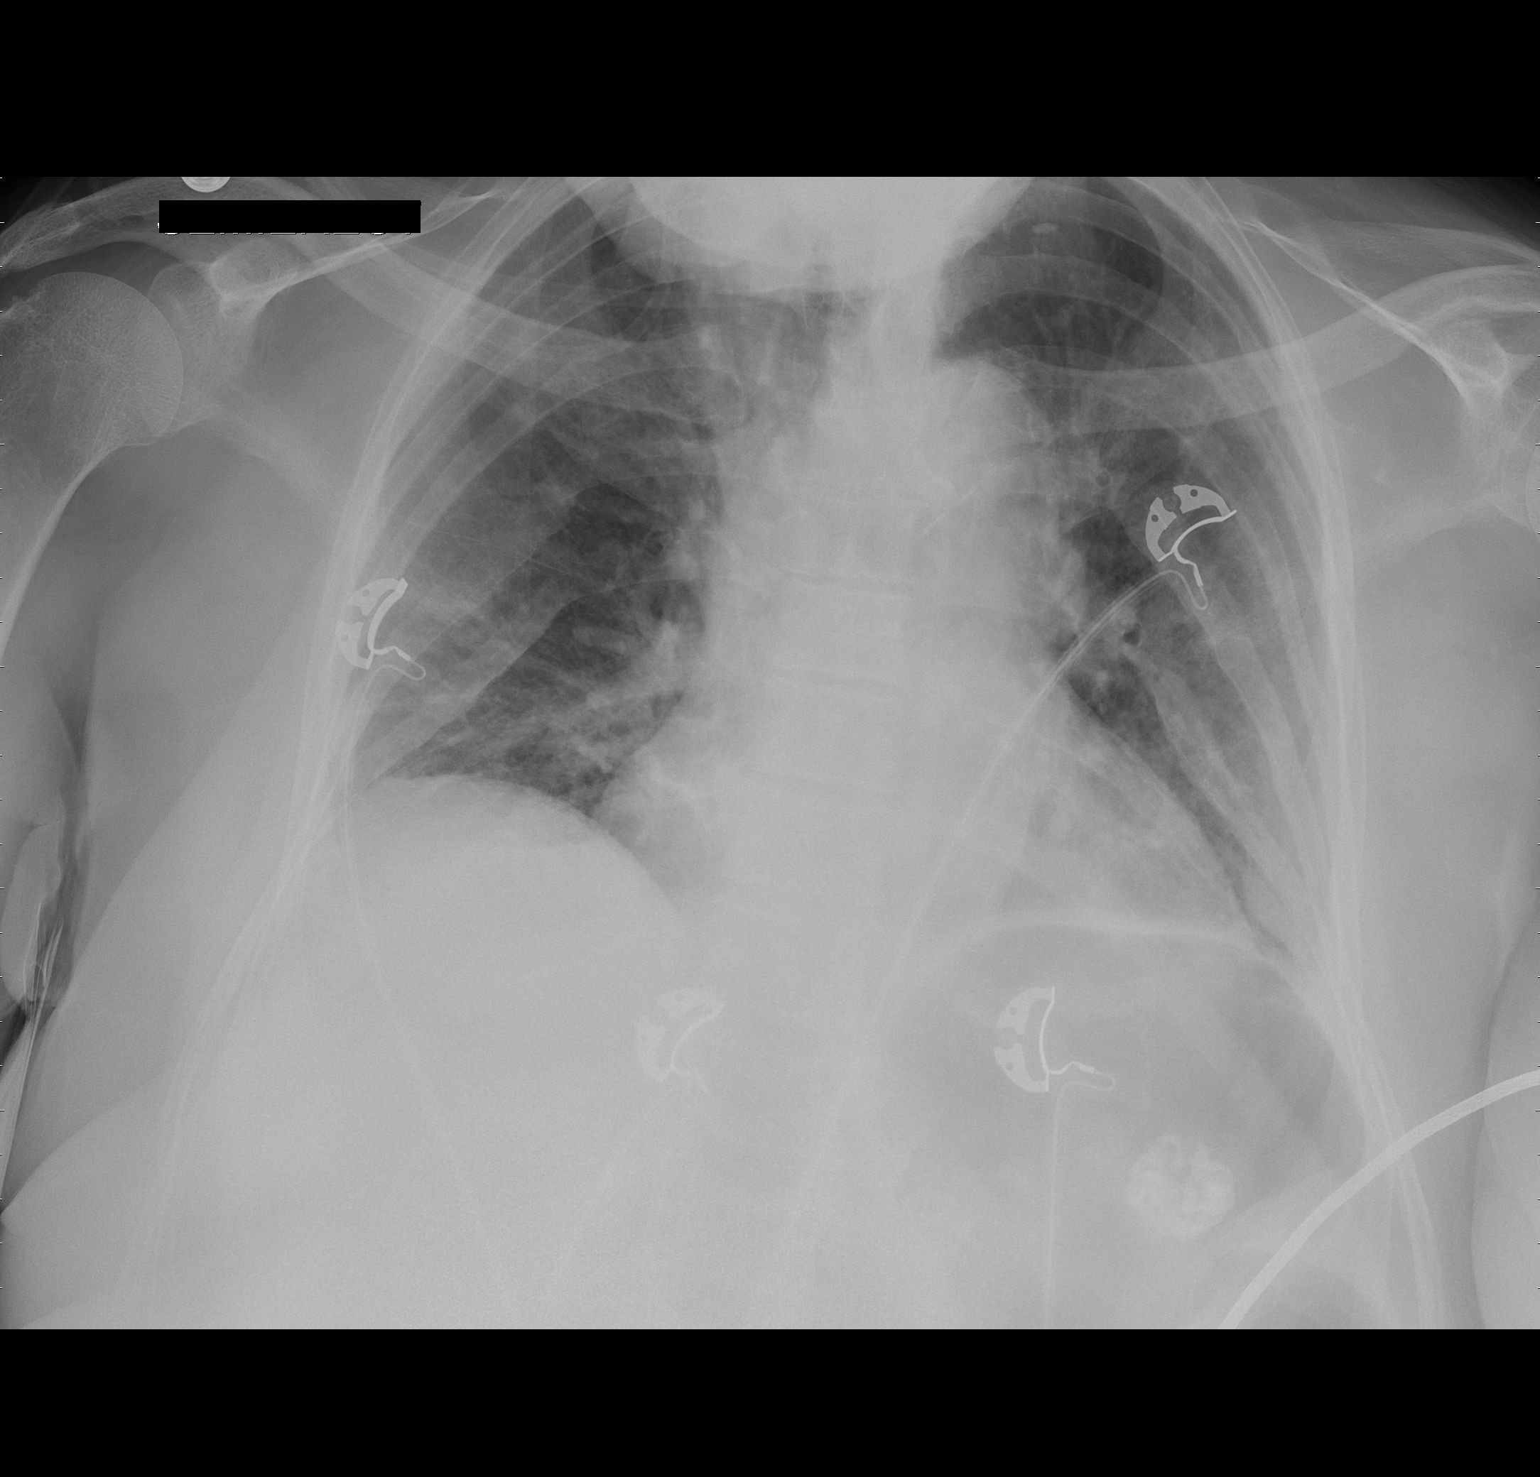

[1 of 1 positions shown; findings below may reference images not displayed]

FINDINGS: Heart size upper normal.  Mild vascular congestion
without edema or effusion.  Bibasilar atelectasis.
IMPRESSION: Mild vascular congestion and bibasilar atelectasis.

## 2012-12-02 IMAGING — US US ABDOMEN COMPLETE
1 series · 13 of 25 positions shown · non-contrast
Comparison: Renal ultrasound 01/03/2007

CLINICAL DATA: Elevated LFTs, sepsis, history diabetes,
hypertension

ULTRASOUND ABDOMEN:
TECHNIQUE: Sonography of upper abdominal structures was performed.

[Series 1: us abdomen complete · 0.30mm/px · 13 of 47 slices shown]
[im 1/47]
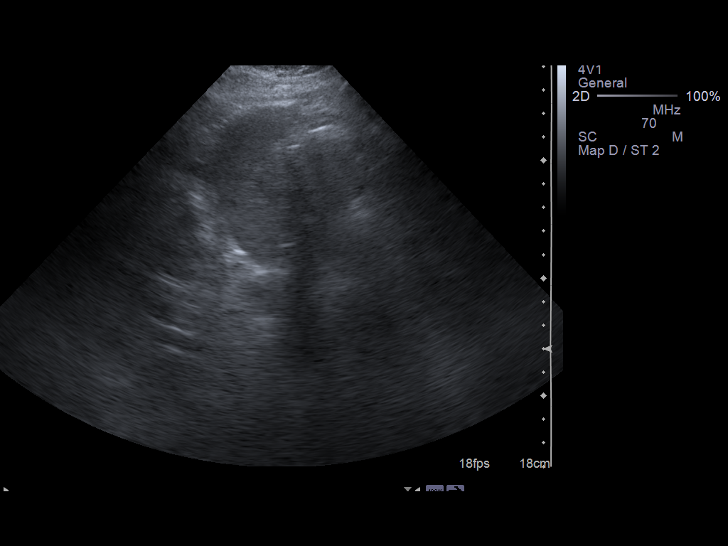
[im 4/47]
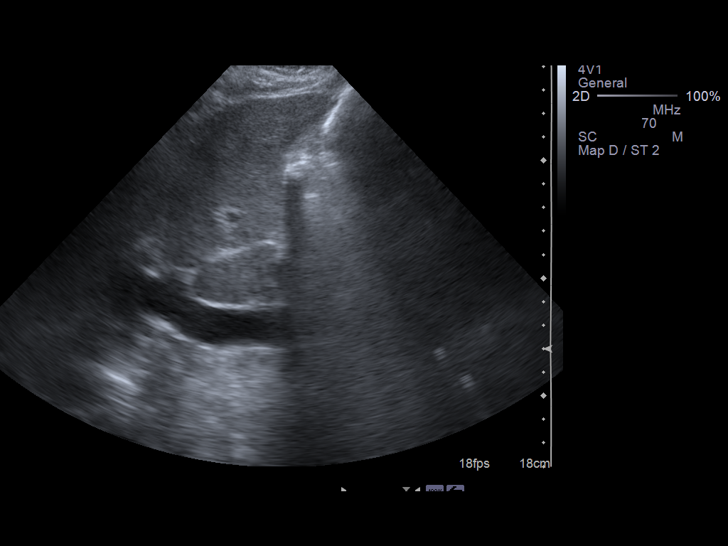
[im 8/47]
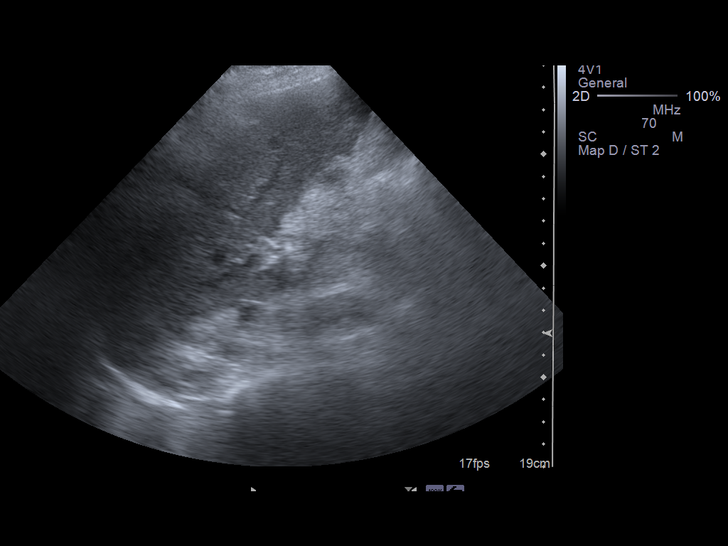
[im 12/47]
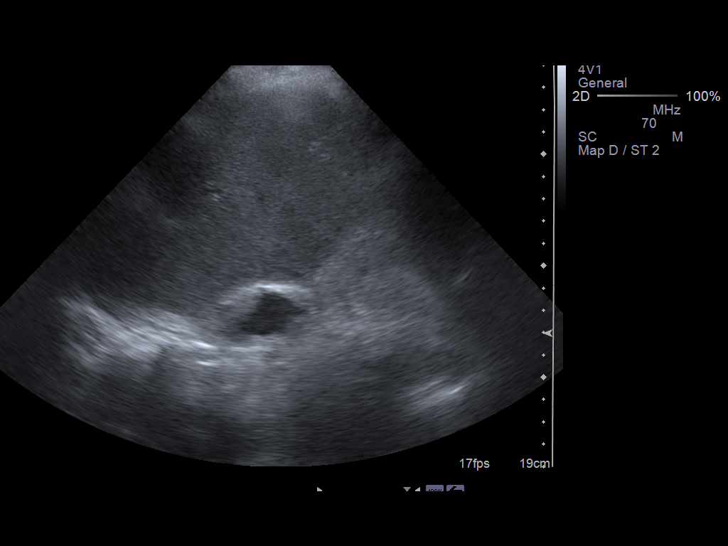
[im 16/47]
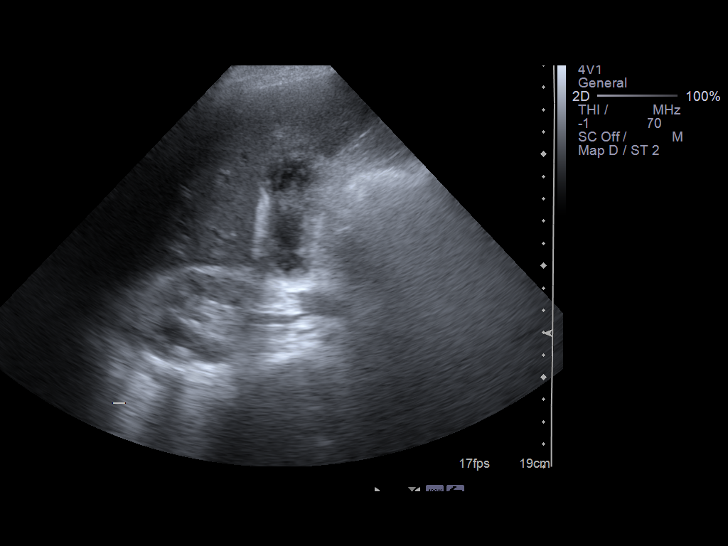
[im 20/47]
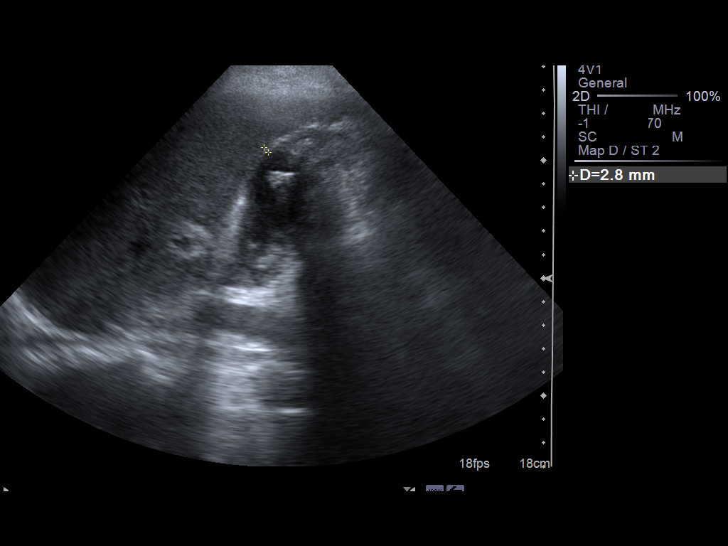
[im 24/47]
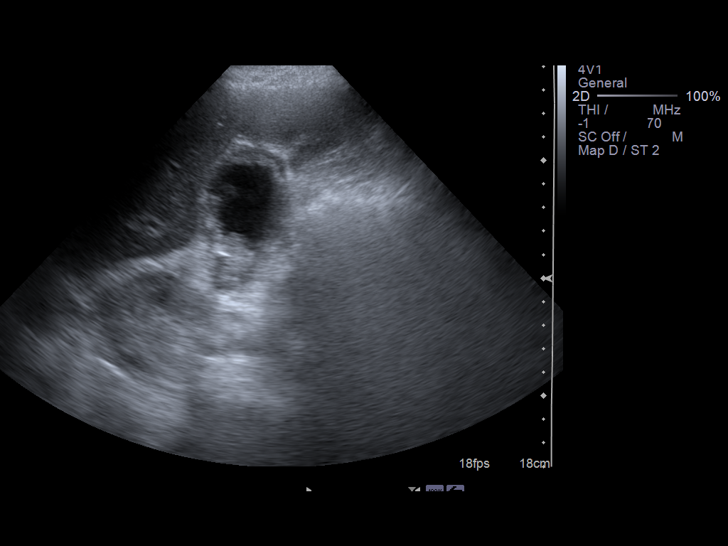
[im 27/47]
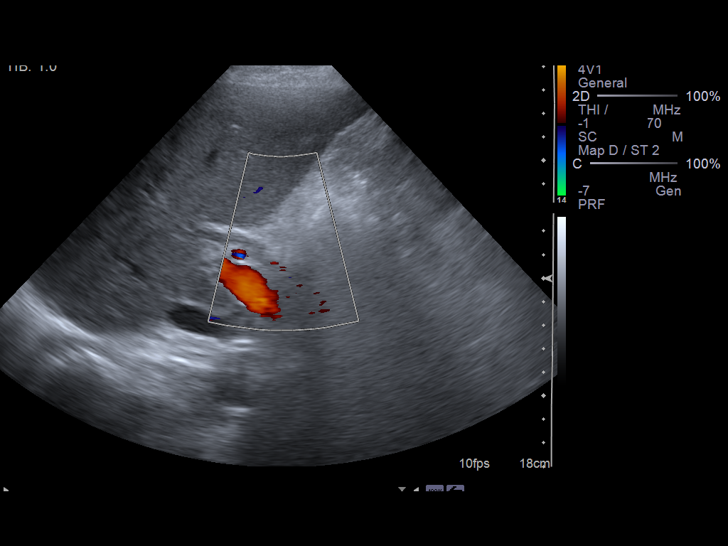
[im 31/47]
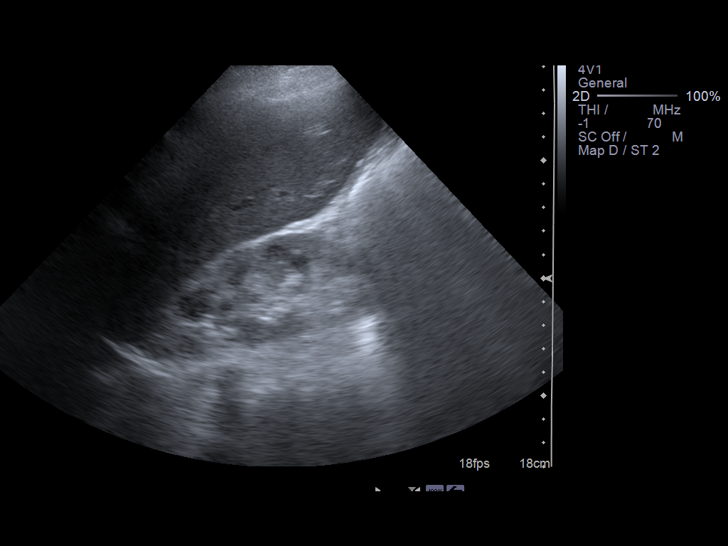
[im 35/47]
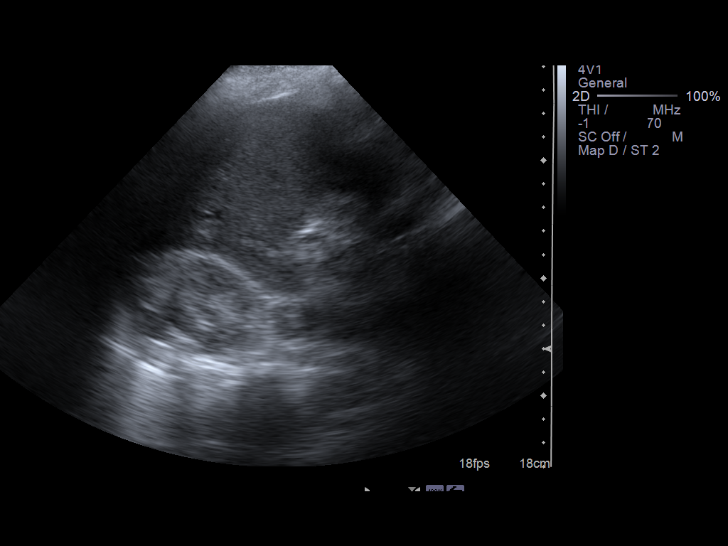
[im 39/47]
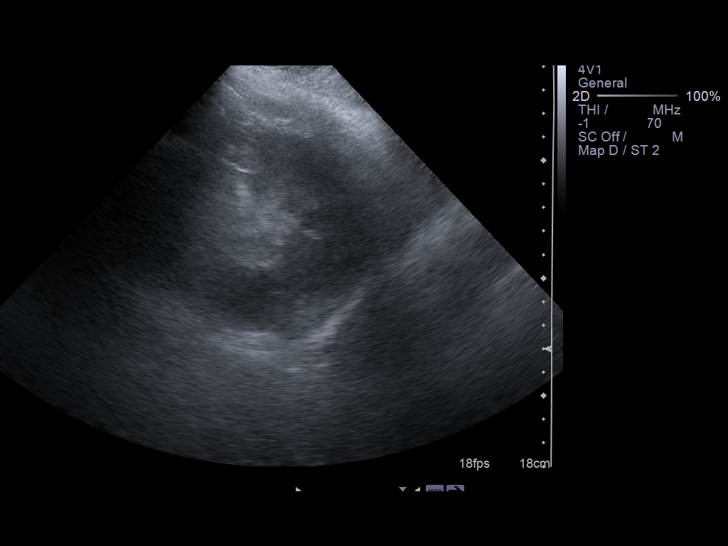
[im 43/47]
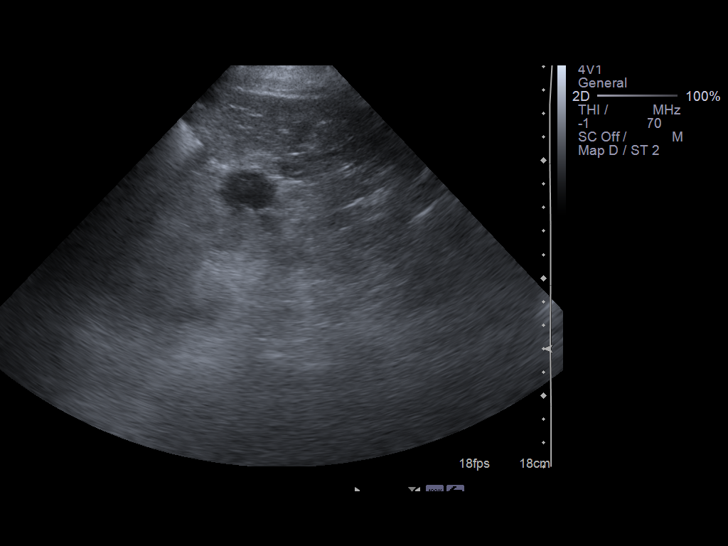
[im 47/47]
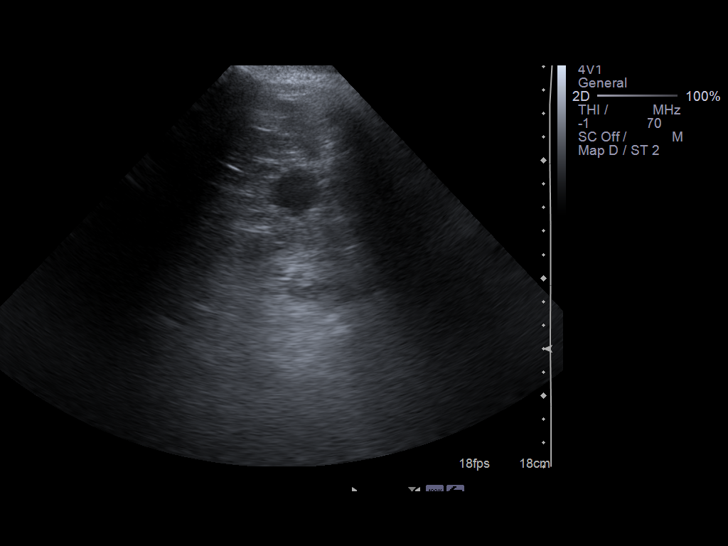

[13 of 25 positions shown; findings below may reference images not displayed]

Gallbladder:  Multiple shadowing calculi within gallbladder of the
13 mm diameter.  Mild irregular gallbladder wall thickening.  No
pericholecystic fluid or sonographic Murphy's sign.

Common bile duct:  Normal caliber 5 mm diameter.

Liver:  Normal appearance

IVC:  Normal appearance

Pancreas:  Not visualized, obscured by bowel gas

Spleen:  Normal appearance, 7.2 cm length.

Right kidney:  9.4 cm length.  Cortical thinning.  Increased renal
cortical echogenicity.  Hypoechoic nodule right kidney 1.8 x 2.0 x
1.8 cm, containing internal echoes should complicated cyst.  No
hydronephrosis.

Left kidney:  10.0 cm length.  Cortical thinning.  Increased renal
cortical echogenicity.  Hypoechoic nodule 2.7 x 2.4 x 2.6 cm
containing internal echoes, question complicated cyst. No
hydronephrosis

Aorta:  Inadequately visualized due to obscuration by bowel gas.

Other:  No free fluid
IMPRESSION: Cholelithiasis with gallbladder wall thickening.
While no definite sonographic Murphy's sign or pericholecystic
fluid identified, acute cholecystitis not excluded; recommend
clinical correlation or consider follow-up hepatobiliary scan.
Medical renal disease changes with question complicated cysts in
both kidneys, slightly decreased in size on right and slightly
increased in size of left since prior study.

## 2012-12-02 IMAGING — CR DG CHEST 2V
1 series · 1 of 1 positions shown · non-contrast
Comparison: 03/26/2011

CLINICAL DATA: Coronary artery disease.  Respiratory failure.

CHEST - 2 VIEW

[w chest pa]
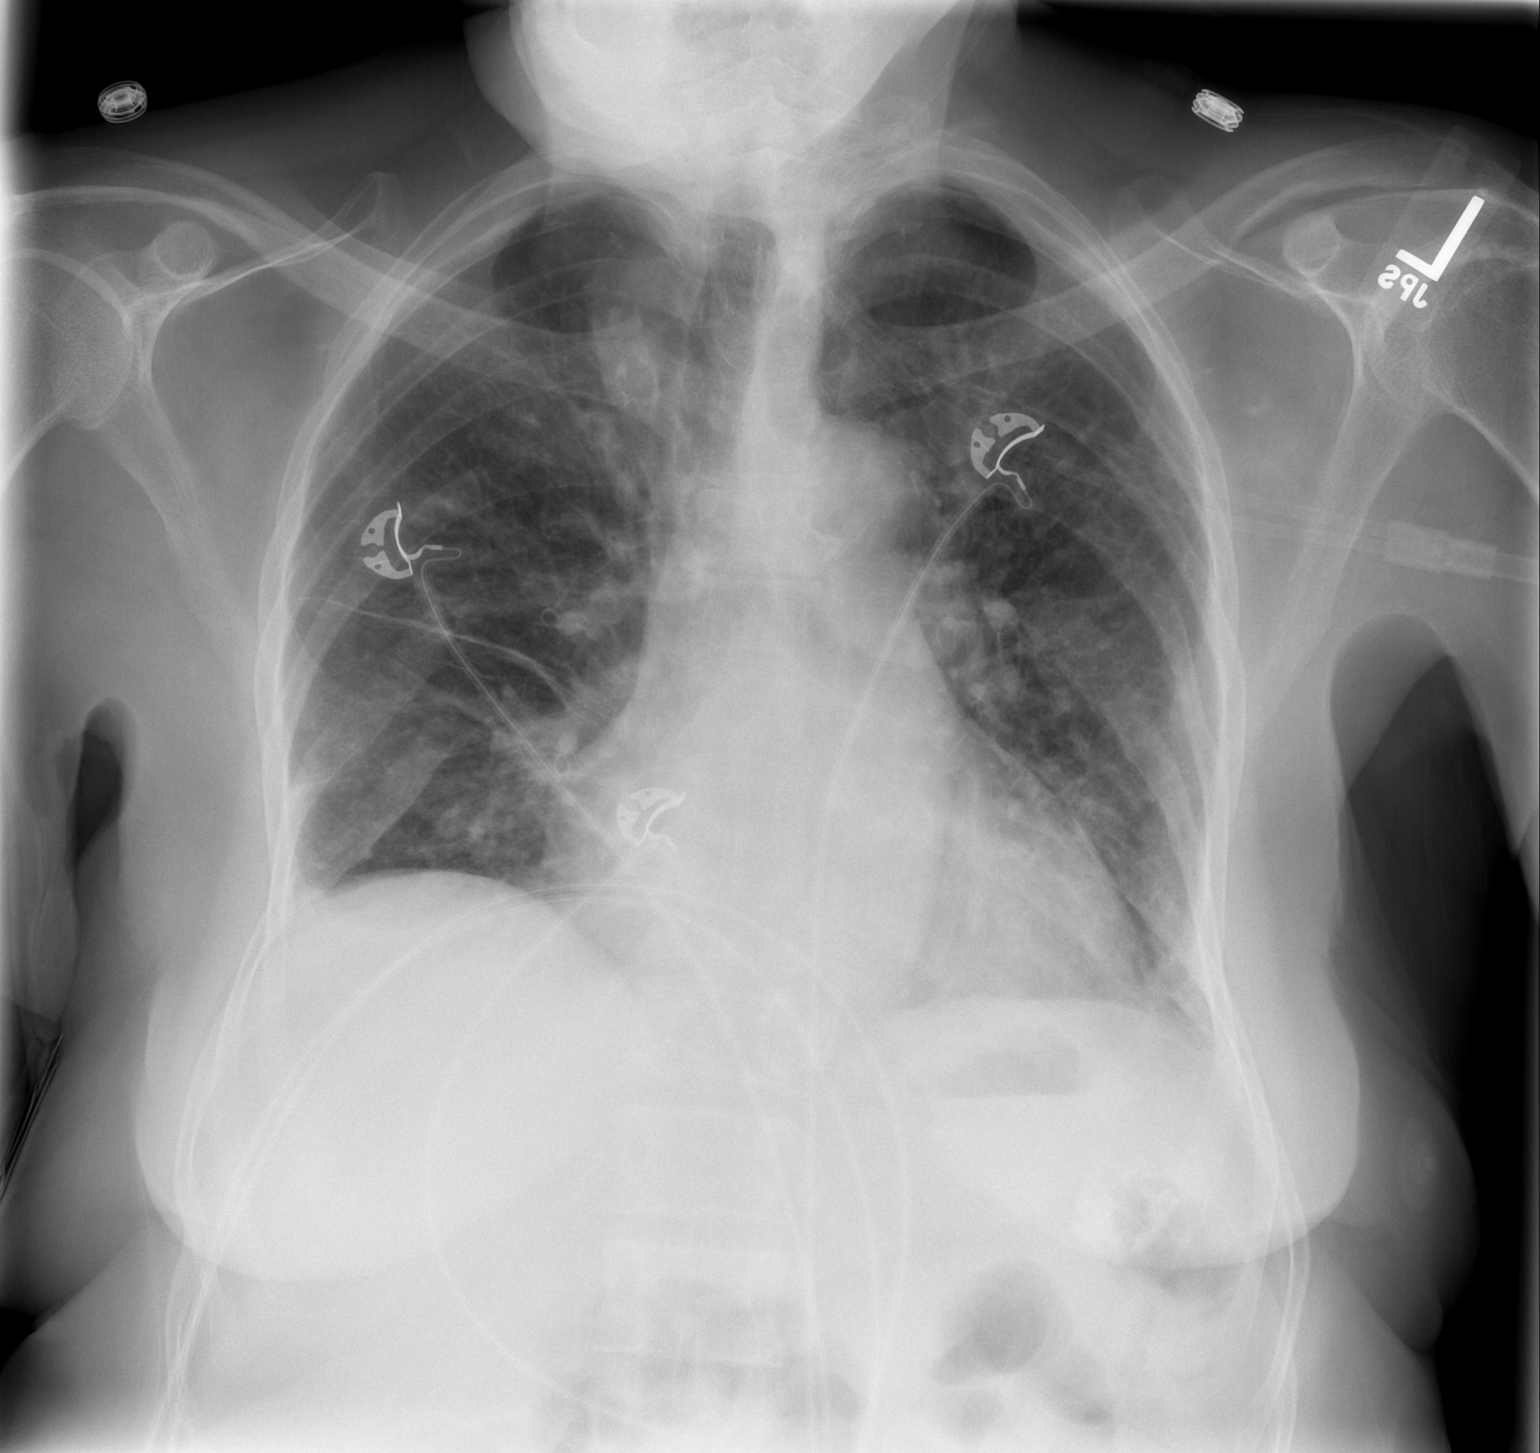

[1 of 1 positions shown; findings below may reference images not displayed]

FINDINGS: The cardiopericardial silhouette is enlarged.
Interstitial markings are diffusely coarsened with chronic
features.  Mild vascular congestion noted without airspace
pulmonary edema.  There is persistent bibasilar atelectasis.
Telemetry leads overlie the chest. Bones are diffusely
demineralized.
IMPRESSION: Cardiomegaly with vascular congestion and underlying chronic
interstitial changes.  No substantial change.

## 2012-12-08 ENCOUNTER — Telehealth: Payer: Self-pay | Admitting: Family Medicine

## 2012-12-08 NOTE — Telephone Encounter (Signed)
Robin at [facility] is given verbal OK to continue filling pt pill box for 3-4 more wk.

## 2012-12-08 NOTE — Telephone Encounter (Signed)
Nurse is calling for verbal order for recertification for refilling med box.  A verbal order is ok and a message can be left.

## 2013-01-11 ENCOUNTER — Encounter: Payer: Self-pay | Admitting: Family Medicine

## 2013-01-11 ENCOUNTER — Ambulatory Visit (INDEPENDENT_AMBULATORY_CARE_PROVIDER_SITE_OTHER): Payer: Medicare Other | Admitting: Family Medicine

## 2013-01-11 VITALS — BP 197/81 | HR 80 | Temp 97.6°F | Ht 60.0 in | Wt 135.0 lb

## 2013-01-11 DIAGNOSIS — I10 Essential (primary) hypertension: Secondary | ICD-10-CM

## 2013-01-11 DIAGNOSIS — R209 Unspecified disturbances of skin sensation: Secondary | ICD-10-CM

## 2013-01-11 DIAGNOSIS — R200 Anesthesia of skin: Secondary | ICD-10-CM | POA: Insufficient documentation

## 2013-01-11 NOTE — Patient Instructions (Addendum)
Please call our office and give the names of the medications you need refilled AND the name of your new pharmacy  Take 2 Tylenol arthritis tablets each night before you go to bed  Take Tramadol as needed for pain during the day  I think you have a pinched nerve in your fingers causing them to be numb. They should get better over the next few months  BRING IN ALL YOUR MEDICATIONS BOTTLES NEXT VISIT

## 2013-01-11 NOTE — Progress Notes (Signed)
  Subjective:    Patient ID: Frances [name], female    DOB: 01-27-1929, 80 y.o.   MRN: [id number]  HPI  Right Fingers numb Complains of numbness in her long and ring finger on right hand (she is left handed)  No pain or swelling or injury.  Has had for about 2-3 weeks.  Not progressive.    HYPERTENSION Disease Monitoring Home BP Monitoring not checking Chest pain- no    Dyspnea- no Medications Compliance-  Out of her medications not sure which ones. [facility] pharmacy closed. Lightheadedness-  no  Edema- moderate unchanged ROS - See HPI  PMH Lab Review   Potassium  Date Value Range Status  10/17/2012 4.4  3.5 - 5.3 mEq/L Final     Sodium  Date Value Range Status  10/17/2012 138  135 - 145 mEq/L Final     Creat  Date Value Range Status  10/17/2012 6.30* 0.50 - 1.10 mg/dL Final     Creatinine, Ser  Date Value Range Status  10/13/2012 3.60* 0.50 - 1.10 mg/dL Final       Review of Symptoms - see HPI  PMH - Smoking status noted.       Review of Systems     Objective:   Physical Exam  Alert her usual self Does not ever want to go back in hospital R hand FROM in all finger and hand joints without pain.  Fingers all appear normal Tinels normal.  Elbow without pain or deformity.  No neck pain or limitation in range of motion  Heart - Regular rate and rhythm.  No murmurs, gallops or rubs.    Lungs:  Normal respiratory effort, chest expands symmetrically. Lungs are clear to auscultation, no crackles or wheezes.       Assessment & Plan:

## 2013-01-11 NOTE — Assessment & Plan Note (Signed)
New  No obvious cause.  Likely nerve impingement.  Will observe since is not progressive and does not interfere with her function

## 2013-01-11 NOTE — Assessment & Plan Note (Signed)
Not well controled but out of her medications due to her pharmacy closing.Her aid Drinda Butts will call in the medications she needs

## 2013-01-16 ENCOUNTER — Telehealth: Payer: Self-pay | Admitting: Family Medicine

## 2013-01-16 NOTE — Telephone Encounter (Signed)
Patient has not been taking her Furosemide - generic or her Tramadol because she doesn't have any left.  The pharmacy was to send in the refill request for both of those.  Also, the caregiver needs clarification on the instructions as well.

## 2013-01-18 NOTE — Telephone Encounter (Signed)
Her caretaker was supposed to call in with Ms Gilmers medication and the name of her new pharmacy  Please contact her for this information  Thanks  LC

## 2013-01-19 NOTE — Telephone Encounter (Signed)
Spoke with caregiver.  She states "Dr Briant Cedar gave her the clonodine and furosemide prescription and I am going to pick them up today from [facility]".  Asked if she knew the dosages.  She did not know that dosages but will call this afternoon and let us know what they are. But per caregiver pt still needs the tramadol called in to [facility] (this is where pt wants to start going since they deliver)   Admin: when caregiver calls back please document the dosages of furosemide and clonodine.  Will also forward to MD for tramadol refill. Pharaoh Pio, Maryjo Rochester

## 2013-01-20 MED ORDER — TRAMADOL HCL 50 MG PO TABS: 50.0000 mg | ORAL_TABLET | Freq: Two times a day (BID) | ORAL | Status: DC | PRN

## 2013-01-20 NOTE — Telephone Encounter (Signed)
Sent refill for tramadol to [place]

## 2013-01-20 NOTE — Telephone Encounter (Signed)
Clonidine 0.2 mg 2 x per day Furosemide 80mg  1 tablet 1 x per day

## 2013-01-22 ENCOUNTER — Emergency Department (HOSPITAL_COMMUNITY): Payer: Medicare Other

## 2013-01-22 ENCOUNTER — Encounter (HOSPITAL_COMMUNITY): Payer: Self-pay | Admitting: Nurse Practitioner

## 2013-01-22 ENCOUNTER — Inpatient Hospital Stay (HOSPITAL_COMMUNITY)
Admission: EM | Admit: 2013-01-22 | Discharge: 2013-01-24 | DRG: 872 | Disposition: A | Discharge status: Home Health | Payer: Medicare Other | Attending: Family Medicine | Admitting: Family Medicine

## 2013-01-22 DIAGNOSIS — N2581 Secondary hyperparathyroidism of renal origin: Secondary | ICD-10-CM | POA: Diagnosis present

## 2013-01-22 DIAGNOSIS — I252 Old myocardial infarction: Secondary | ICD-10-CM

## 2013-01-22 DIAGNOSIS — I959 Hypotension, unspecified: Secondary | ICD-10-CM

## 2013-01-22 DIAGNOSIS — T68XXXD Hypothermia, subsequent encounter: Secondary | ICD-10-CM

## 2013-01-22 DIAGNOSIS — R200 Anesthesia of skin: Secondary | ICD-10-CM

## 2013-01-22 DIAGNOSIS — N184 Chronic kidney disease, stage 4 (severe): Secondary | ICD-10-CM

## 2013-01-22 DIAGNOSIS — T68XXXA Hypothermia, initial encounter: Secondary | ICD-10-CM

## 2013-01-22 DIAGNOSIS — Z79899 Other long term (current) drug therapy: Secondary | ICD-10-CM

## 2013-01-22 DIAGNOSIS — I495 Sick sinus syndrome: Secondary | ICD-10-CM | POA: Diagnosis present

## 2013-01-22 DIAGNOSIS — Z87891 Personal history of nicotine dependence: Secondary | ICD-10-CM

## 2013-01-22 DIAGNOSIS — R32 Unspecified urinary incontinence: Secondary | ICD-10-CM

## 2013-01-22 DIAGNOSIS — IMO0001 Reserved for inherently not codable concepts without codable children: Secondary | ICD-10-CM

## 2013-01-22 DIAGNOSIS — Z789 Other specified health status: Secondary | ICD-10-CM

## 2013-01-22 DIAGNOSIS — I428 Other cardiomyopathies: Secondary | ICD-10-CM | POA: Diagnosis present

## 2013-01-22 DIAGNOSIS — I455 Other specified heart block: Secondary | ICD-10-CM

## 2013-01-22 DIAGNOSIS — Z7982 Long term (current) use of aspirin: Secondary | ICD-10-CM

## 2013-01-22 DIAGNOSIS — Z8673 Personal history of transient ischemic attack (TIA), and cerebral infarction without residual deficits: Secondary | ICD-10-CM

## 2013-01-22 DIAGNOSIS — I420 Dilated cardiomyopathy: Secondary | ICD-10-CM

## 2013-01-22 DIAGNOSIS — D649 Anemia, unspecified: Secondary | ICD-10-CM

## 2013-01-22 DIAGNOSIS — K819 Cholecystitis, unspecified: Secondary | ICD-10-CM

## 2013-01-22 DIAGNOSIS — I10 Essential (primary) hypertension: Secondary | ICD-10-CM

## 2013-01-22 DIAGNOSIS — A419 Sepsis, unspecified organism: Principal | ICD-10-CM

## 2013-01-22 DIAGNOSIS — N19 Unspecified kidney failure: Secondary | ICD-10-CM

## 2013-01-22 DIAGNOSIS — R634 Abnormal weight loss: Secondary | ICD-10-CM

## 2013-01-22 DIAGNOSIS — I2510 Atherosclerotic heart disease of native coronary artery without angina pectoris: Secondary | ICD-10-CM | POA: Diagnosis present

## 2013-01-22 DIAGNOSIS — N390 Urinary tract infection, site not specified: Secondary | ICD-10-CM

## 2013-01-22 DIAGNOSIS — I1310 Hypertensive heart and chronic kidney disease without heart failure, with stage 1 through stage 4 chronic kidney disease, or unspecified chronic kidney disease: Secondary | ICD-10-CM

## 2013-01-22 DIAGNOSIS — I871 Compression of vein: Secondary | ICD-10-CM

## 2013-01-22 DIAGNOSIS — M1990 Unspecified osteoarthritis, unspecified site: Secondary | ICD-10-CM

## 2013-01-22 DIAGNOSIS — Z9181 History of falling: Secondary | ICD-10-CM

## 2013-01-22 DIAGNOSIS — R627 Adult failure to thrive: Secondary | ICD-10-CM | POA: Diagnosis present

## 2013-01-22 HISTORY — DX: Cerebral infarction, unspecified: I63.9

## 2013-01-22 LAB — POCT I-STAT, CHEM 8
Calcium, Ion: 1.21 mmol/L (ref 1.13–1.30)
HCT: 33 % — ABNORMAL LOW (ref 36.0–46.0)
TCO2: 27 mmol/L (ref 0–100)

## 2013-01-22 LAB — CBC WITH DIFFERENTIAL/PLATELET
Basophils Absolute: 0 10*3/uL (ref 0.0–0.1)
HCT: 31.6 % — ABNORMAL LOW (ref 36.0–46.0)
Hemoglobin: 10.2 g/dL — ABNORMAL LOW (ref 12.0–15.0)
Lymphocytes Relative: 28 % (ref 12–46)
Monocytes Absolute: 0.6 10*3/uL (ref 0.1–1.0)
Neutro Abs: 2.3 10*3/uL (ref 1.7–7.7)
RBC: 3.88 MIL/uL (ref 3.87–5.11)
RDW: 14.2 % (ref 11.5–15.5)
WBC: 4.4 10*3/uL (ref 4.0–10.5)

## 2013-01-22 LAB — BASIC METABOLIC PANEL
BUN: 40 mg/dL — ABNORMAL HIGH (ref 6–23)
Creatinine, Ser: 3.16 mg/dL — ABNORMAL HIGH (ref 0.50–1.10)
GFR calc Af Amer: 15 mL/min — ABNORMAL LOW (ref >90)
GFR calc non Af Amer: 13 mL/min — ABNORMAL LOW (ref >90)
Potassium: 4.1 mEq/L (ref 3.5–5.1)

## 2013-01-22 LAB — URINALYSIS, ROUTINE W REFLEX MICROSCOPIC
Nitrite: POSITIVE — AB
Specific Gravity, Urine: 1.01 (ref 1.005–1.030)
pH: 6 (ref 5.0–8.0)

## 2013-01-22 LAB — PROTIME-INR
INR: 1.07 (ref 0.00–1.49)
Prothrombin Time: 13.8 seconds (ref 11.6–15.2)

## 2013-01-22 LAB — GLUCOSE, CAPILLARY
Glucose-Capillary: 91 mg/dL (ref 70–99)
Glucose-Capillary: 135 mg/dL — ABNORMAL HIGH (ref 70–99)

## 2013-01-22 LAB — POCT I-STAT TROPONIN I: Troponin i, poc: 0.05 ng/mL (ref 0.00–0.08)

## 2013-01-22 LAB — MRSA PCR SCREENING: MRSA by PCR: NEGATIVE

## 2013-01-22 LAB — URINE MICROSCOPIC-ADD ON

## 2013-01-22 MED ORDER — DEXTROSE 5 % IV SOLN
1.0000 g | INTRAVENOUS | Status: AC
  Administered 2013-01-22: 1 g via INTRAVENOUS
  Filled 2013-01-22: qty 10

## 2013-01-22 MED ORDER — SODIUM CHLORIDE 0.9 % IV SOLN
1000.0000 mL | Freq: Once | INTRAVENOUS | Status: AC
  Administered 2013-01-22: 1000 mL via INTRAVENOUS

## 2013-01-22 MED ORDER — VANCOMYCIN HCL IN DEXTROSE 1-5 GM/200ML-% IV SOLN
1000.0000 mg | Freq: Once | INTRAVENOUS | Status: AC
  Administered 2013-01-22: 1000 mg via INTRAVENOUS
  Filled 2013-01-22: qty 200

## 2013-01-22 MED ORDER — CINACALCET HCL 30 MG PO TABS: 60.0000 mg | ORAL_TABLET | Freq: Every day | ORAL | Status: DC

## 2013-01-22 MED ORDER — PIPERACILLIN-TAZOBACTAM 3.375 G IVPB
3.3750 g | Freq: Once | INTRAVENOUS | Status: AC
  Administered 2013-01-22: 3.375 g via INTRAVENOUS
  Filled 2013-01-22: qty 50

## 2013-01-22 MED ORDER — SODIUM CHLORIDE 0.9 % IV SOLN
1000.0000 mL | INTRAVENOUS | Status: DC
  Administered 2013-01-22: 1000 mL via INTRAVENOUS

## 2013-01-22 MED ORDER — VANCOMYCIN HCL IN DEXTROSE 1-5 GM/200ML-% IV SOLN: 1000.0000 mg | INTRAVENOUS | Status: DC

## 2013-01-22 MED ORDER — CINACALCET HCL 30 MG PO TABS
60.0000 mg | ORAL_TABLET | Freq: Every day | ORAL | Status: DC
  Administered 2013-01-23 – 2013-01-24 (×2): 60 mg via ORAL
  Filled 2013-01-22 (×4): qty 2

## 2013-01-22 MED ORDER — ASPIRIN EC 81 MG PO TBEC
81.0000 mg | DELAYED_RELEASE_TABLET | Freq: Every day | ORAL | Status: DC
  Administered 2013-01-23 – 2013-01-24 (×2): 81 mg via ORAL
  Filled 2013-01-22 (×2): qty 1

## 2013-01-22 MED ORDER — PIPERACILLIN-TAZOBACTAM IN DEX 2-0.25 GM/50ML IV SOLN
2.2500 g | Freq: Four times a day (QID) | INTRAVENOUS | Status: DC
  Administered 2013-01-22 – 2013-01-24 (×6): 2.25 g via INTRAVENOUS
  Filled 2013-01-22 (×12): qty 50

## 2013-01-22 MED ORDER — NITROGLYCERIN 0.4 MG SL SUBL: 0.4000 mg | SUBLINGUAL_TABLET | SUBLINGUAL | Status: DC | PRN

## 2013-01-22 MED ORDER — ATORVASTATIN CALCIUM 40 MG PO TABS
40.0000 mg | ORAL_TABLET | Freq: Every day | ORAL | Status: DC
  Administered 2013-01-22 – 2013-01-23 (×2): 40 mg via ORAL
  Filled 2013-01-22 (×4): qty 1

## 2013-01-22 MED ORDER — HEPARIN SODIUM (PORCINE) 5000 UNIT/ML IJ SOLN
5000.0000 [IU] | Freq: Three times a day (TID) | INTRAMUSCULAR | Status: DC
  Administered 2013-01-22 – 2013-01-24 (×5): 5000 [IU] via SUBCUTANEOUS
  Filled 2013-01-22 (×10): qty 1

## 2013-01-22 MED ORDER — SODIUM BICARBONATE 8.4 % IV SOLN: 50.0000 meq | Freq: Once | INTRAVENOUS | Status: DC

## 2013-01-22 MED ORDER — CLONIDINE HCL 0.1 MG PO TABS
0.1000 mg | ORAL_TABLET | Freq: Two times a day (BID) | ORAL | Status: DC
  Administered 2013-01-22 – 2013-01-23 (×2): 0.1 mg via ORAL
  Filled 2013-01-22 (×3): qty 1

## 2013-01-22 MED ORDER — SODIUM CHLORIDE 0.9 % IJ SOLN
3.0000 mL | Freq: Two times a day (BID) | INTRAMUSCULAR | Status: DC
  Administered 2013-01-22 – 2013-01-23 (×2): 3 mL via INTRAVENOUS

## 2013-01-22 NOTE — Progress Notes (Signed)
ANTIBIOTIC CONSULT NOTE - INITIAL  Pharmacy Consult for vancomycin Indication: rule out sepsis, UTI  No Known Allergies  Patient Measurements: Height: 4' 11.84" (152 cm) Weight: 134 lb 14.7 oz (61.2 kg) IBW/kg (Calculated) : 45.14  Vital Signs: Temp: 95.7 F (35.4 C) (04/27 1437) Temp src: Rectal (04/27 1437) BP: 139/60 mmHg (04/27 1445) Pulse Rate: 62 (04/27 1445) Intake/Output from previous day:   Intake/Output from this shift:    Labs:  Recent Labs  01/22/13 1145 01/22/13 1220  WBC 4.4  --   HGB 10.2* 11.2*  PLT 212  --   CREATININE 3.16* 2.90*   Estimated Creatinine Clearance: 12.4 ml/min (by C-G formula based on Cr of 2.9). No results found for this basename: VANCOTROUGH, VANCOPEAK, VANCORANDOM, GENTTROUGH, GENTPEAK, GENTRANDOM, TOBRATROUGH, TOBRAPEAK, TOBRARND, AMIKACINPEAK, AMIKACINTROU, AMIKACIN,  in the last 72 hours   Microbiology: No results found for this or any previous visit (from the past 720 hour(s)).  Medical History: Past Medical History  Diagnosis Date  . CAD (coronary artery disease) 2005    Last cath in 2005 - no significant disease  . Hypertension   . Renal disorder   . UTI (lower urinary tract infection) 10/10/2012  . Stroke     Medications:  Anti-infectives   Start     Dose/Rate Route Frequency Ordered Stop   01/24/13 1700  vancomycin (VANCOCIN) IVPB 1000 mg/200 mL premix     1,000 mg 200 mL/hr over 60 Minutes Intravenous Every 48 hours 01/22/13 1554     01/23/13 0000  piperacillin-tazobactam (ZOSYN) IVPB 2.25 g     2.25 g 100 mL/hr over 30 Minutes Intravenous 4 times per day 01/22/13 1556     01/22/13 1515  piperacillin-tazobactam (ZOSYN) IVPB 3.375 g     3.375 g 12.5 mL/hr over 240 Minutes Intravenous  Once 01/22/13 1502     01/22/13 1515  vancomycin (VANCOCIN) IVPB 1000 mg/200 mL premix     1,000 mg 200 mL/hr over 60 Minutes Intravenous  Once 01/22/13 1502     01/22/13 1415  cefTRIAXone (ROCEPHIN) 1 g in dextrose 5 % 50 mL  IVPB     1 g 100 mL/hr over 30 Minutes Intravenous NOW 01/22/13 1410 01/22/13 1508     Assessment: 83 yof presented to the ED with hypotension and weakness. To start empiric vancomycin + zosyn for sepsis and UTI. Pt has poor renal fxn with estimate CrCl<110ml/min. She has already received 1gm ceftriaxone in the ED and has also been ordered 1gm vanc + 3.375gm zosyn in the ED.   Goal of Therapy:  Vancomycin trough level 15-20 mcg/ml  Plan:  1. Vancomycin 1gm IV Q48H 2. After initial zosyn 3.375gm dose, change MD ordered subsequent dosing to 2.25gm IV Q6H for renal dysfunction 3. F/u renal fxn, C&S, clinical status and trough at [facility], Drake Leach 01/22/2013,3:57 PM

## 2013-01-22 NOTE — ED Provider Notes (Signed)
History     CSN: [id number]  Arrival date & time 01/22/13  1105   First MD Initiated Contact with Patient 01/22/13 1125      Chief Complaint  Patient presents with  . Hypotension    (Consider location/radiation/quality/duration/timing/severity/associated sxs/prior treatment) HPI Comments: 79 year old female who presents with her caregiver with a complaint of generalized weakness and dizziness with failure to thrive. The patient has a history of myocardial infarction, hypertension, recurrent urinary tract infections and a stroke. According to the caregiver who is the primary historian as the patient has difficulty speaking she has had several days of poor by mouth intake, increased weakness and difficulty ambulating out of bed. She has not had any vomiting, diarrhea or coughing and she denies having any fever. Nothing seems to make this better or worse, no associated rashes or swelling. She has recently had some medication changes including possibly starting clonidine as well as decreasing her Lasix.  The history is provided by the patient and a friend.    Past Medical History  Diagnosis Date  . CAD (coronary artery disease) 2005    Last cath in 2005 - no significant disease  . Normal echocardiogram 02/26/2011    mild findings EJ fx 55   . Hypertension   . Renal disorder   . UTI (lower urinary tract infection) 10/10/2012  . Stroke     Past Surgical History  Procedure Laterality Date  . Abdominal surgery      midline abdominal scar   . Knee surgery      both knees     History reviewed. No pertinent family history.  History  Substance Use Topics  . Smoking status: Former Games developer  . Smokeless tobacco: Never Used  . Alcohol Use: No    OB History   Grav Para Term Preterm Abortions TAB SAB Ect Mult Living                  Review of Systems  All other systems reviewed and are negative.    Allergies  Review of patient's allergies indicates no known allergies.  Home  Medications   Current Outpatient Rx  Name  Route  Sig  Dispense  Refill  . acetaminophen (TYLENOL) 500 MG tablet   Oral   Take 1,000 mg by mouth at bedtime.         Marland Kitchen aspirin EC 81 MG tablet   Oral   Take 81 mg by mouth daily.         Marland Kitchen atorvastatin (LIPITOR) 40 MG tablet   Oral   Take 1 tablet (40 mg total) by mouth at bedtime.   30 tablet   5   . cinacalcet (SENSIPAR) 60 MG tablet   Oral   Take 60 mg by mouth daily.           . cloNIDine (CATAPRES) 0.2 MG tablet   Oral   Take 0.2 mg by mouth 2 (two) times daily.         . furosemide (LASIX) 80 MG tablet   Oral   Take 80 mg by mouth daily.         . hydrALAZINE (APRESOLINE) 25 MG tablet   Oral   Take 37.5 mg by mouth every 8 (eight) hours.         . isosorbide mononitrate (IMDUR) 30 MG 24 hr tablet   Oral   Take 1 tablet (30 mg total) by mouth daily.   30 tablet   0   .  metoprolol tartrate (LOPRESSOR) 25 MG tablet   Oral   Take 25 mg by mouth 2 (two) times daily.          . nitroGLYCERIN (NITROSTAT) 0.4 MG SL tablet   Sublingual   Place 0.4 mg under the tongue every 5 (five) minutes as needed for chest pain.           BP 139/60  Pulse 62  Temp(Src) 95.7 F (35.4 C) (Rectal)  Resp 14  SpO2 99%  Physical Exam  Nursing note and vitals reviewed. Constitutional: She appears well-developed and well-nourished. No distress.  HENT:  Head: Normocephalic and atraumatic.  Mouth/Throat: No oropharyngeal exudate.  Mucous membranes are dehydrated  Eyes: Conjunctivae and EOM are normal. Pupils are equal, round, and reactive to light. Right eye exhibits no discharge. Left eye exhibits no discharge. No scleral icterus.  Neck: Normal range of motion. Neck supple. No JVD present. No thyromegaly present.  Cardiovascular: Normal rate, regular rhythm, normal heart sounds and intact distal pulses.  Exam reveals no gallop and no friction rub.   No murmur heard. Pulmonary/Chest: Effort normal and breath  sounds normal. No respiratory distress. She has no wheezes. She has no rales.  Abdominal: Soft. Bowel sounds are normal. She exhibits no distension and no mass. There is no tenderness.  Musculoskeletal: Normal range of motion. She exhibits no edema and no tenderness.  Lymphadenopathy:    She has no cervical adenopathy.  Neurological: She is alert. Coordination normal.  Diffuse generalized weakness but able to follow commands with all 4 extremities, no focal weakness of the upper or lower extremities, left or right. No facial droop. Speech is a phasic, baseline per friend  Skin: Skin is warm and dry. No rash noted. No erythema.  Psychiatric: She has a normal mood and affect. Her behavior is normal.    ED Course  Procedures (including critical care time)  Labs Reviewed  URINALYSIS, ROUTINE W REFLEX MICROSCOPIC - Abnormal; Notable for the following:    APPearance HAZY (*)    Protein, ur 30 (*)    Nitrite POSITIVE (*)    Leukocytes, UA MODERATE (*)    All other components within normal limits  CBC WITH DIFFERENTIAL - Abnormal; Notable for the following:    Hemoglobin 10.2 (*)    HCT 31.6 (*)    Monocytes Relative 13 (*)    All other components within normal limits  PRO B NATRIURETIC PEPTIDE - Abnormal; Notable for the following:    Pro B Natriuretic peptide (BNP) 1813.0 (*)    All other components within normal limits  BASIC METABOLIC PANEL - Abnormal; Notable for the following:    Glucose, Bld 110 (*)    BUN 40 (*)    Creatinine, Ser 3.16 (*)    GFR calc non Af Amer 13 (*)    GFR calc Af Amer 15 (*)    All other components within normal limits  URINE MICROSCOPIC-ADD ON - Abnormal; Notable for the following:    Squamous Epithelial / LPF FEW (*)    Bacteria, UA MANY (*)    All other components within normal limits  POCT I-STAT, CHEM 8 - Abnormal; Notable for the following:    BUN 48 (*)    Creatinine, Ser 2.90 (*)    Glucose, Bld 108 (*)    Hemoglobin 11.2 (*)    HCT 33.0 (*)     All other components within normal limits  CULTURE, BLOOD (ROUTINE X 2)  CULTURE, BLOOD (ROUTINE X  2)  URINE CULTURE  URINE CULTURE  PROCALCITONIN  PROTIME-INR  APTT  GLUCOSE, CAPILLARY  POCT I-STAT TROPONIN I  CG4 I-STAT (LACTIC ACID)   Dg Chest Port 1 View  01/22/2013  *RADIOLOGY REPORT*  Clinical Data: Hypotensive  PORTABLE CHEST - 1 VIEW  Comparison: 10/10/2012  Findings: Hypoventilation with bibasilar atelectasis.  Negative for heart failure.  No significant effusion or mass lesion identified.  IMPRESSION: Hypoventilation with bibasilar atelectasis.   Original Report Authenticated By: Janeece Riggers, M.D.      1. Sepsis   2. Hypotension   3. Renal failure   4. Anemia   5. UTI (lower urinary tract infection)       MDM  The patient has hypotension which is very concerning as the blood pressure is currently 78/39 but was as low as 65 systolic on arrival. She has no focal symptoms of infection, would consider that this could be bacteremia or sepsis, would also consider pneumonia, urinary infection, dehydration or possible medication hypotension. Will start with a sepsis workup and fluid hydration with reevaluation. The patient will be on a cardiac monitor, EKG is unremarkable and unchanged from prior.   ED ECG REPORT  I personally interpreted this EKG   Date: 01/22/2013   Rate: 66  Rhythm: normal sinus rhythm  QRS Axis: normal  Intervals: normal  ST/T Wave abnormalities: nonspecific ST/T changes  Conduction Disutrbances:nonspecific intraventricular conduction delay  Narrative Interpretation: T-wave is inverted in the inferior leads as well as lateral precordial leads. LVH is present  Old EKG Reviewed: unchanged compared with 10/12/2012   Labs show the patient had baseline renal failure status, slight anemia, no significant signs of the patient's chest x-ray.  Fluid resuscitation is been initiated, the patient's blood pressure has stayed around 80 systolic, no tachycardia,  no fever.  The patient is hypothermic, persistently hypotensive, IV fluid rehydration has been ordered and she is improving but still with a systolic less than 100. Due to the fact that she has a urinary source she is likely septic, broad-spectrum antibiotics ordered, will be admitted to the hospital.  CRITICAL CARE Performed by: Vida Roller   Total critical care time: 35  Critical care time was exclusive of separately billable procedures and treating other patients.  Critical care was necessary to treat or prevent imminent or life-threatening deterioration.  Critical care was time spent personally by me on the following activities: development of treatment plan with patient and/or surrogate as well as nursing, discussions with consultants, evaluation of patient's response to treatment, examination of patient, obtaining history from patient or surrogate, ordering and performing treatments and interventions, ordering and review of laboratory studies, ordering and review of radiographic studies, pulse oximetry and re-evaluation of patient's condition.    Vida Roller, MD 01/22/13 [phone]

## 2013-01-22 NOTE — ED Notes (Signed)
Pt placed on monitor, cont. bp and pusle ox monitoring.

## 2013-01-22 NOTE — H&P (Signed)
Family Medicine Teaching [facility] Admission History and Physical  Patient name: Frances [name] Medical record number: [id number] Date of birth: 10/10/1928 Age: 79 y.o. Gender: female  Primary Care Provider: Carney Living, MD  Chief Complaint: Generalize weakness, low BP History of Present Illness:  Frances [name] is a 79 y.o. year old female with extensive PMH including dilated cardiomyopathy and stage IV CKD presenting with several days of weakness. This was first noticed by her caregiver, Etheleen Nicks, on Friday. It was a generalized weakness and not focal. It was accompanied by visual hallucinations that day. Frances [name] encouraged that patient to come to the ED, but she refused. However, today Frances [name] was able to convince her. The patient denies any current hallucination, chest pain, shortness of breath, nausea, vomiting, or diarrhea. She only states that she is hungry and would like to eat.   Upon arrival to the ED, the patient's BP was 67/30, but she was mentating well according to the ED team. Urine and blood cultures were drawn. She was given 2L of normal saline and pressures increased to > 100/50. Her urinalysis showed + nitrites, leukocytes, and bacteria. Therefore, was given 1 g ceftriaxone. However, thereafter, her rectal temperature was found to be 95.7, so she was started on broad spectrum antibiotics of Vancomycin and Zosyn. At this time though all other vital signs were stable, including a blood pressure of 159/68, so critical care was not contacted.   Social Note - Patient is a widow with one son in [place] who is estranged. When asked if she wanted me to contact her son about medical decisions in case she became too sick to make her own decisions, she vehemently said no. She said that she would like her caregiver Antionette Zachery [name] [phone]) to make those decision. Her caregiver was at bedside and agreeable to this plan. She says they have tried in the past to  obtain HCPOA, but the patient cannot afford to do so.    Patient Active Problem List  Diagnosis  . HYPERTENSION, BENIGN SYSTEMIC  . EDEMA-LEGS,DUE TO VENOUS OBSTRUCT.  Marland Kitchen CHRONIC KIDNEY DISEASE STAGE IV (SEVERE)  . DJD (degenerative joint disease)  . WEIGHT LOSS  . URINARY INCONTINENCE  . Health care home, active care coordination  . Cholecystitis  . At high risk for falls  . Cardiorenal syndrome with renal failure  . Congestive dilated cardiomyopathy, EF < 20%  . Sinus pause, refuses pacemaker/ICD  . Normal coronary arteries 2005  . Finger numbness   Past Medical History: Past Medical History  Diagnosis Date  . CAD (coronary artery disease) 2005    Last cath in 2005 - no significant disease  . Hypertension   . Renal disorder   . UTI (lower urinary tract infection) 10/10/2012  . Stroke     Past Surgical History: Past Surgical History  Procedure Laterality Date  . Abdominal surgery      midline abdominal scar   . Knee surgery      both knees     Social History: History   Social History  . Marital Status: Widowed    Spouse Name: N/A    Number of Children: N/A  . Years of Education: N/A   Social History Main Topics  . Smoking status: Former Games developer  . Smokeless tobacco: Never Used  . Alcohol Use: No  . Drug Use: No  . Sexually Active: None   Other Topics Concern  . None   Social History Narrative  Lives alone.    Has home health aide M-F          Family History: History reviewed. No pertinent family history.  Allergies: No Known Allergies  Current Facility-Administered Medications  Medication Dose Route Frequency Provider Last Rate Last Dose  . 0.9 %  sodium chloride infusion  1,000 mL Intravenous Continuous Vida Roller, MD 125 mL/hr at 01/22/13 1436 1,000 mL at 01/22/13 1436  . [START ON 01/23/2013] aspirin EC tablet 81 mg  81 mg Oral Daily Garnetta Buddy, MD      . atorvastatin (LIPITOR) tablet 40 mg  40 mg Oral QHS Garnetta Buddy,  MD      . Melene Muller ON 01/23/2013] cinacalcet (SENSIPAR) tablet 60 mg  60 mg Oral Q breakfast Vida Roller, MD      . heparin injection 5,000 Units  5,000 Units Subcutaneous Q8H Garnetta Buddy, MD      . nitroGLYCERIN (NITROSTAT) SL tablet 0.4 mg  0.4 mg Sublingual Q5 min PRN Garnetta Buddy, MD      . Melene Muller ON 01/23/2013] piperacillin-tazobactam (ZOSYN) IVPB 2.25 g  2.25 g Intravenous Q6H Drake Leach Rumbarger, [facility]      . piperacillin-tazobactam (ZOSYN) IVPB 3.375 g  3.375 g Intravenous Once Vida Roller, MD      . sodium bicarbonate injection 50 mEq  50 mEq Intravenous Once Vida Roller, MD      . sodium chloride 0.9 % injection 3 mL  3 mL Intravenous Q12H Garnetta Buddy, MD      . vancomycin (VANCOCIN) IVPB 1000 mg/200 mL premix  1,000 mg Intravenous Once Vida Roller, MD      . Melene Muller ON 01/24/2013] vancomycin (VANCOCIN) IVPB 1000 mg/200 mL premix  1,000 mg Intravenous Q48H Drake Leach [place], [facility]       Current Outpatient Prescriptions  Medication Sig Dispense Refill  . acetaminophen (TYLENOL) 500 MG tablet Take 1,000 mg by mouth at bedtime.      Marland Kitchen aspirin EC 81 MG tablet Take 81 mg by mouth daily.      Marland Kitchen atorvastatin (LIPITOR) 40 MG tablet Take 1 tablet (40 mg total) by mouth at bedtime.  30 tablet  5  . cinacalcet (SENSIPAR) 60 MG tablet Take 60 mg by mouth daily.        . cloNIDine (CATAPRES) 0.2 MG tablet Take 0.2 mg by mouth 2 (two) times daily.      . furosemide (LASIX) 80 MG tablet Take 80 mg by mouth daily.      . hydrALAZINE (APRESOLINE) 25 MG tablet Take 37.5 mg by mouth every 8 (eight) hours.      . isosorbide mononitrate (IMDUR) 30 MG 24 hr tablet Take 1 tablet (30 mg total) by mouth daily.  30 tablet  0  . metoprolol tartrate (LOPRESSOR) 25 MG tablet Take 25 mg by mouth 2 (two) times daily.       . nitroGLYCERIN (NITROSTAT) 0.4 MG SL tablet Place 0.4 mg under the tongue every 5 (five) minutes as needed for chest pain.       Review Of Systems: Per HPI with  the following additions: none Otherwise 12 point review of systems was performed and was unremarkable.  Physical Exam: Temp:  [95.7 F (35.4 C)] 95.7 F (35.4 C) (04/27 1437) Pulse Rate:  [58-67] 62 (04/27 1445) Resp:  [14-24] 14 (04/27 1445) BP: (67-152)/(30-60) 139/60 mmHg (04/27 1445) SpO2:  [97 %-100 %] 99 % (04/27 1445) Weight:  [  134 lb 14.7 oz (61.2 kg)] 134 lb 14.7 oz (61.2 kg) (04/27 1500)  General: alert, cooperative and no distress, non toxic appearing, conversant but very difficult to understand since edentulous  HEENT: pupils unequal bilaterally, EOMI  Heart: S1, S2 normal, no murmur, rub or gallop, regular rate and rhythm  Lungs: clear to auscultation, no wheezes or rales, unlabored breathing and poor respiratory effort  Abdomen: abdomen is soft without significant tenderness, masses, organomegaly or guarding  Extremities: extremities normal, atraumatic, no cyanosis or edema  Skin:no rashes  Neurology: normal without focal findings, speech difficult to understand but not aphasic, alert and oriented x 4  Labs and Imaging:  Results for orders placed during the hospital encounter of 01/22/13 (from the past 24 hour(s))  CBC WITH DIFFERENTIAL     Status: Abnormal   Collection Time    01/22/13 11:45 AM      Result Value Range   WBC 4.4  4.0 - 10.5 K/uL   RBC 3.88  3.87 - 5.11 MIL/uL   Hemoglobin 10.2 (*) 12.0 - 15.0 g/dL   HCT 16.1 (*) 09.6 - 04.5 %   MCV 81.4  78.0 - 100.0 fL   MCH 26.3  26.0 - 34.0 pg   MCHC 32.3  30.0 - 36.0 g/dL   RDW 40.9  81.1 - 91.4 %   Platelets 212  150 - 400 K/uL   Neutrophils Relative 53  43 - 77 %   Neutro Abs 2.3  1.7 - 7.7 K/uL   Lymphocytes Relative 28  12 - 46 %   Lymphs Abs 1.2  0.7 - 4.0 K/uL   Monocytes Relative 13 (*) 3 - 12 %   Monocytes Absolute 0.6  0.1 - 1.0 K/uL   Eosinophils Relative 5  0 - 5 %   Eosinophils Absolute 0.2  0.0 - 0.7 K/uL   Basophils Relative 1  0 - 1 %   Basophils Absolute 0.0  0.0 - 0.1 K/uL   PROTIME-INR     Status: None   Collection Time    01/22/13 11:45 AM      Result Value Range   Prothrombin Time 13.8  11.6 - 15.2 seconds   INR 1.07  0.00 - 1.49  APTT     Status: None   Collection Time    01/22/13 11:45 AM      Result Value Range   aPTT 29  24 - 37 seconds  BASIC METABOLIC PANEL     Status: Abnormal   Collection Time    01/22/13 11:45 AM      Result Value Range   Sodium 140  135 - 145 mEq/L   Potassium 4.1  3.5 - 5.1 mEq/L   Chloride 105  96 - 112 mEq/L   CO2 23  19 - 32 mEq/L   Glucose, Bld 110 (*) 70 - 99 mg/dL   BUN 40 (*) 6 - 23 mg/dL   Creatinine, Ser 7.82 (*) 0.50 - 1.10 mg/dL   Calcium 9.5  8.4 - 95.6 mg/dL   GFR calc non Af Amer 13 (*) >90 mL/min   GFR calc Af Amer 15 (*) >90 mL/min  PROCALCITONIN     Status: None   Collection Time    01/22/13 11:47 AM      Result Value Range   Procalcitonin 0.13    PRO B NATRIURETIC PEPTIDE     Status: Abnormal   Collection Time    01/22/13 11:47 AM      Result  Value Range   Pro B Natriuretic peptide (BNP) 1813.0 (*) 0 - 450 pg/mL  POCT I-STAT TROPONIN I     Status: None   Collection Time    01/22/13 12:18 PM      Result Value Range   Troponin i, poc 0.05  0.00 - 0.08 ng/mL   Comment 3           POCT I-STAT, CHEM 8     Status: Abnormal   Collection Time    01/22/13 12:20 PM      Result Value Range   Sodium 140  135 - 145 mEq/L   Potassium 4.1  3.5 - 5.1 mEq/L   Chloride 108  96 - 112 mEq/L   BUN 48 (*) 6 - 23 mg/dL   Creatinine, Ser 1.61 (*) 0.50 - 1.10 mg/dL   Glucose, Bld 096 (*) 70 - 99 mg/dL   Calcium, Ion 0.45  4.09 - 1.30 mmol/L   TCO2 27  0 - 100 mmol/L   Hemoglobin 11.2 (*) 12.0 - 15.0 g/dL   HCT 81.1 (*) 91.4 - 78.2 %  CG4 I-STAT (LACTIC ACID)     Status: None   Collection Time    01/22/13 12:23 PM      Result Value Range   Lactic Acid, Venous 2.03  0.5 - 2.2 mmol/L  URINALYSIS, ROUTINE W REFLEX MICROSCOPIC     Status: Abnormal   Collection Time    01/22/13 12:54 PM      Result Value  Range   Color, Urine YELLOW  YELLOW   APPearance HAZY (*) CLEAR   Specific Gravity, Urine 1.010  1.005 - 1.030   pH 6.0  5.0 - 8.0   Glucose, UA NEGATIVE  NEGATIVE mg/dL   Hgb urine dipstick NEGATIVE  NEGATIVE   Bilirubin Urine NEGATIVE  NEGATIVE   Ketones, ur NEGATIVE  NEGATIVE mg/dL   Protein, ur 30 (*) NEGATIVE mg/dL   Urobilinogen, UA 0.2  0.0 - 1.0 mg/dL   Nitrite POSITIVE (*) NEGATIVE   Leukocytes, UA MODERATE (*) NEGATIVE  URINE MICROSCOPIC-ADD ON     Status: Abnormal   Collection Time    01/22/13 12:54 PM      Result Value Range   Squamous Epithelial / LPF FEW (*) RARE   WBC, UA 7-10  <3 WBC/hpf   Bacteria, UA MANY (*) RARE  GLUCOSE, CAPILLARY     Status: None   Collection Time    01/22/13  2:17 PM      Result Value Range   Glucose-Capillary 91  70 - 99 mg/dL   Comment 1 Notify RN     Comment 2 Call MD NNP PA CNM      Dg Chest Port 1 View  01/22/2013  *RADIOLOGY REPORT*  Clinical Data: Hypotensive  PORTABLE CHEST - 1 VIEW  Comparison: 10/10/2012  Findings: Hypoventilation with bibasilar atelectasis.  Negative for heart failure.  No significant effusion or mass lesion identified.  IMPRESSION: Hypoventilation with bibasilar atelectasis.   Original Report Authenticated By: Janeece Riggers, M.D.      Date: 01/22/2013  Rate: 66  Rhythm: normal sinus rhythm  QRS Axis: normal  Intervals: normal  ST/T Wave abnormalities: nonspecific T wave changes  Conduction Disutrbances:none  Narrative Interpretation: no evidence of STEMI  Old EKG Reviewed: unchanged     Assessment and Plan: Ryn A Bardon is a 79 y.o. year old female with extensive PMH including severe dilate cardiomyopathy and Stage IV CKD presenting with hypotension and hypothermia  likely caused by urinary tract infection.   # Sepsis - Hypotension to 60's/30's resolved with 2L fluid; Hypothermia to 95.7 rectally; Source of infection likely urine - last culture positive for E. Coli (January 2014) but considered  colonization - Continue with broad spectrum anitbiotics of Vanc and Zosyn - Follow up urine and blood cultures - No need for critical care given BP responsive to fluids - Hold BP meds, as polypharmacy may be causative of hypotension as well since patient recently started taking furosemide and clonidine again according to PCP notes from 4/25  # Cardiac - History of dilated cardiomyopathy (EF < 20% Echo Jan 2014), tachy-brady syndrome and cardio-renal syndrome; Patient refused cardiac catheter and pacemaker on previous admission; EKG on admission little changes, trop I neg and patient without complaints of chest pain  - Hold home clonidine, furosemide, hdyralazine, Imdur, and metoprolol - Watch carefully for volume overload given 2L NS bolus in ED; provide lasix if necessary  - Cont nitro PRN, ASA and statin  # Renal - Chronic CKD stage IV-V, baseline creatinine 3.5; Dr. Briant Cedar of [facility] is nephrologist - Stable renal disease right now, follow daily  - Cont sensipar for secondary hyperparathyroidism   # Social - Patient with no family and sole support is caregiver who some for 1hr and 45 minutes daily.  - Set up HCPOA for [name] to make medical decisions  - Consult PT/OT for in home services   FENGI - Mechanical soft diet since edentulous; stop IVF since BP now stable and severe CHF PPX - Heparin [place] TID DISPO - Admit to stepdown unit under care of Family Medicine Teaching Service  Si Raider. Clinton Sawyer, MD, MBA 01/22/2013, 4:06 PM Family Medicine Resident, PGY-2 [phone] pager

## 2013-01-22 NOTE — ED Notes (Signed)
Caretaker brought pt because "she hasnt been feeling well or looking well this past week." reports she lives at home alone and caregiver helps her with ADLS. Pt with poor oral intake over past week. Denies pain or fevers. Pt is alert and mild labored breathing.

## 2013-01-23 DIAGNOSIS — T68XXXA Hypothermia, initial encounter: Secondary | ICD-10-CM

## 2013-01-23 DIAGNOSIS — I959 Hypotension, unspecified: Secondary | ICD-10-CM

## 2013-01-23 DIAGNOSIS — Z5189 Encounter for other specified aftercare: Secondary | ICD-10-CM

## 2013-01-23 DIAGNOSIS — N390 Urinary tract infection, site not specified: Secondary | ICD-10-CM

## 2013-01-23 DIAGNOSIS — N184 Chronic kidney disease, stage 4 (severe): Secondary | ICD-10-CM

## 2013-01-23 DIAGNOSIS — A419 Sepsis, unspecified organism: Principal | ICD-10-CM | POA: Diagnosis present

## 2013-01-23 LAB — COMPREHENSIVE METABOLIC PANEL
ALT: 8 U/L (ref 0–35)
AST: 13 U/L (ref 0–37)
Alkaline Phosphatase: 88 U/L (ref 39–117)
Calcium: 8.6 mg/dL (ref 8.4–10.5)
GFR calc Af Amer: 16 mL/min — ABNORMAL LOW (ref >90)
Glucose, Bld: 99 mg/dL (ref 70–99)
Potassium: 3.9 mEq/L (ref 3.5–5.1)
Sodium: 137 mEq/L (ref 135–145)
Total Protein: 6.7 g/dL (ref 6.0–8.3)

## 2013-01-23 LAB — CBC
HCT: 28.7 % — ABNORMAL LOW (ref 36.0–46.0)
Hemoglobin: 9.5 g/dL — ABNORMAL LOW (ref 12.0–15.0)
MCH: 27.1 pg (ref 26.0–34.0)
MCHC: 33.1 g/dL (ref 30.0–36.0)
MCV: 81.8 fL (ref 78.0–100.0)
RDW: 14.5 % (ref 11.5–15.5)

## 2013-01-23 MED ORDER — ALBUTEROL SULFATE (5 MG/ML) 0.5% IN NEBU
INHALATION_SOLUTION | RESPIRATORY_TRACT | Status: AC
  Filled 2013-01-23: qty 0.5

## 2013-01-23 MED ORDER — METOPROLOL TARTRATE 25 MG PO TABS
25.0000 mg | ORAL_TABLET | Freq: Two times a day (BID) | ORAL | Status: DC
  Administered 2013-01-23: 25 mg via ORAL
  Filled 2013-01-23 (×3): qty 1

## 2013-01-23 MED ORDER — METOPROLOL TARTRATE 25 MG PO TABS: 25.0000 mg | ORAL_TABLET | Freq: Two times a day (BID) | ORAL | Status: DC

## 2013-01-23 MED ORDER — LIDOCAINE HCL (PF) 1 % IJ SOLN
INTRAMUSCULAR | Status: AC
  Filled 2013-01-23: qty 30

## 2013-01-23 MED ORDER — IPRATROPIUM BROMIDE 0.02 % IN SOLN
RESPIRATORY_TRACT | Status: AC
  Filled 2013-01-23: qty 2.5

## 2013-01-23 MED ORDER — BUPIVACAINE HCL (PF) 0.5 % IJ SOLN
INTRAMUSCULAR | Status: AC
  Filled 2013-01-23: qty 30

## 2013-01-23 MED ORDER — ENSURE COMPLETE PO LIQD: 237.0000 mL | ORAL | Status: DC | PRN

## 2013-01-23 MED ORDER — FUROSEMIDE 20 MG PO TABS
20.0000 mg | ORAL_TABLET | Freq: Every day | ORAL | Status: DC
  Administered 2013-01-23 – 2013-01-24 (×2): 20 mg via ORAL
  Filled 2013-01-23 (×4): qty 1

## 2013-01-23 NOTE — Progress Notes (Signed)
INITIAL NUTRITION ASSESSMENT  DOCUMENTATION CODES Per approved criteria  -Not Applicable   INTERVENTION: 1. Ensure Complete po PRN, each supplement provides 350 kcal and 13 grams of protein.   NUTRITION DIAGNOSIS: Unintentional weight loss related to decreased appetite as evidenced by pt report.   Goal: PO intake to meet >/=90% estimated nutrition needs  Monitor:  PO intake, weight trends, labs   Reason for Assessment: Malnutrition screening tool  79 y.o. female  Admitting Dx: Sepsis(995.91)  ASSESSMENT: Pt brought to the ED by caregiver for weakness and AMS. Pt live alone and a caregiver comes to her home daily.  RD pulled to chart for unintentional weight loss. Pt states she has lost about 5 lbs recently. Eats sometimes and other times has no appetite. Does drink Ensure on occasion when her aid can bring them to her. Hx is difficult to obtain with pt's difficulty with speech.   Weight hx shows weight is trending up for the last few months. Now appears to be around her usual weight.  Height: Ht Readings from Last 1 Encounters:  01/22/13 4' 11.84" (1.52 m)    Weight: Wt Readings from Last 1 Encounters:  01/22/13 134 lb 14.7 oz (61.2 kg)    Ideal Body Weight: 100 lbs   % Ideal Body Weight: 134%  Wt Readings from Last 10 Encounters:  01/22/13 134 lb 14.7 oz (61.2 kg)  01/11/13 135 lb (61.236 kg)  10/17/12 124 lb (56.246 kg)  10/13/12 123 lb 3.8 oz (55.9 kg)  04/11/12 135 lb (61.236 kg)  01/04/12 142 lb (64.411 kg)  11/30/11 137 lb (62.143 kg)  11/25/11 136 lb (61.689 kg)  10/14/11 139 lb 9.6 oz (63.322 kg)  09/09/11 139 lb (63.05 kg)    Usual Body Weight: 135-140 lbs   % Usual Body Weight: ~100%  BMI:  Body mass index is 26.49 kg/(m^2). Overweight   Estimated Nutritional Needs: Kcal: 1300-1500 Protein: 35-45 gm  Fluid: >/= 1.5 L   Skin: intact   Diet Order: Cardiac  EDUCATION NEEDS: -No education needs identified at this time   Intake/Output  Summary (Last 24 hours) at 01/23/13 1313 Last data filed at 01/23/13 1240  Gross per 24 hour  Intake    610 ml  Output    700 ml  Net    -90 ml    Last BM: PTA    Labs:   Recent Labs Lab 01/22/13 1145 01/22/13 1220 01/22/13 2340  NA 140 140 137  K 4.1 4.1 3.9  CL 105 108 104  CO2 23  --  23  BUN 40* 48* 39*  CREATININE 3.16* 2.90* 2.93*  CALCIUM 9.5  --  8.6  GLUCOSE 110* 108* 99    CBG (last 3)   Recent Labs  01/22/13 1417 01/22/13 1711  GLUCAP 91 135*    Scheduled Meds: . albuterol      . aspirin EC  81 mg Oral Daily  . atorvastatin  40 mg Oral QHS  . cinacalcet  60 mg Oral Q breakfast  . furosemide  20 mg Oral Q breakfast  . heparin  5,000 Units Subcutaneous Q8H  . ipratropium      . metoprolol tartrate  25 mg Oral BID  . piperacillin-tazobactam (ZOSYN)  IV  2.25 g Intravenous Q6H  . sodium bicarbonate  50 mEq Intravenous Once  . sodium chloride  3 mL Intravenous Q12H  . [START ON 01/24/2013] vancomycin  1,000 mg Intravenous Q48H    Continuous Infusions:   Past  Medical History  Diagnosis Date  . CAD (coronary artery disease) 2005    Last cath in 2005 - no significant disease  . Hypertension   . Renal disorder   . UTI (lower urinary tract infection) 10/10/2012  . Stroke     Past Surgical History  Procedure Laterality Date  . Abdominal surgery      midline abdominal scar   . Knee surgery      both knees     Clarene Duke RD, LDN Pager [phone] After Hours pager [phone]

## 2013-01-23 NOTE — Evaluation (Signed)
Physical Therapy Evaluation Patient Details Name: Frances [name] MRN: [id number] DOB: 24-Nov-1928 Today's Date: 01/23/2013 Time: 1050-1107 PT Time Calculation (min): 17 min  PT Assessment / Plan / Recommendation Clinical Impression  Pt 79 yo female admitted for UTI, low BP and hallucinations. Pt lives alone with caregiver 1.5-2 hours/day M-F. Per patient caregiver to increase hours once she returns home Pt functioning near baseline, however stays within home. Pt with social services to assist with meals as well. Pt to benefit from HHPT to maximize functional mobility and safety within home to decrease burden on caregiver. Acute PT to follow to achieve maximal functional recovery.    PT Assessment  Patient needs continued PT services    Follow Up Recommendations  Home health PT;Supervision - Intermittent    Does the patient have the potential to tolerate intense rehabilitation      Barriers to Discharge Decreased caregiver support      Equipment Recommendations  None recommended by PT    Recommendations for Other Services     Frequency Min 3X/week    Precautions / Restrictions Precautions Precautions: Fall Restrictions Weight Bearing Restrictions: No   Pertinent Vitals/Pain Pt denies pain      Mobility  Bed Mobility Bed Mobility: Not assessed (pt received up in chair) Supine to Sit: 5: Supervision;With rails;HOB flat Sitting - Scoot to Edge of Bed: 6: Modified independent (Device/Increase time) Details for Bed Mobility Assistance: requires increased time Transfers Transfers: Sit to Stand;Stand to Sit Sit to Stand: With upper extremity assist;With armrests;From chair/3-in-1;4: Min guard Stand to Sit: 4: Min guard;With upper extremity assist;To chair/3-in-1 Details for Transfer Assistance: increased time but safe technique Ambulation/Gait Ambulation/Gait Assistance: 4: Min guard Ambulation Distance (Feet): 50 Feet Assistive device: None Ambulation/Gait Assistance  Details: cautious, guarded, slow, no episodes of LOB Gait Pattern: Step-to pattern;Decreased stride length;Shuffle;Trunk flexed;Narrow base of support Gait velocity: slow Stairs: No    Exercises     PT Diagnosis: Generalized weakness  PT Problem List: Decreased strength;Decreased activity tolerance;Decreased mobility;Decreased balance PT Treatment Interventions: Gait training;Stair training;Functional mobility training;Therapeutic activities;Therapeutic exercise   PT Goals Acute Rehab PT Goals PT Goal Formulation: With patient Time For Goal Achievement: 01/30/13 Potential to Achieve Goals: Good Pt will go Supine/Side to Sit: with modified independence;with HOB 0 degrees PT Goal: Supine/Side to Sit - Progress: Goal set today Pt will go Sit to Stand: with modified independence;with upper extremity assist PT Goal: Sit to Stand - Progress: Goal set today Pt will Ambulate: 51 - 150 feet;with modified independence;with cane PT Goal: Ambulate - Progress: Goal set today Pt will Go Up / Down Stairs: 6-9 stairs;with supervision;with rail(s) PT Goal: Up/Down Stairs - Progress: Goal set today Pt will Perform Home Exercise Program: with supervision, verbal cues required/provided PT Goal: Perform Home Exercise Program - Progress: Goal set today  Visit Information  Last PT Received On: 01/23/13 Assistance Needed: +1    Subjective Data  Subjective: Pt received sitting up in chair agreeable to PT.   Prior Functioning  Home Living Lives With: Alone Available Help at Discharge: Available PRN/intermittently;Personal care attendant (1.5-2 hours/day) Type of Home: House Home Access: Stairs to enter [employer] of Steps: 6 Entrance Stairs-Rails: Right Home Layout: One level Bathroom Shower/Tub: Engineer, manufacturing systems: Standard Home Adaptive Equipment: Shower chair with back;Straight cane;Walker - rolling;Other (comment) (lifeline) Additional Comments: Pt reports social  services comes once a month to assist with groceries Prior Function Level of Independence: Needs assistance Needs Assistance: Meal Prep;Light Housekeeping Meal Prep:  Moderate Light Housekeeping: Moderate Able to Take Stairs?: Yes Driving: No Communication Communication: Expressive difficulties (Pt very difficult to understand) Dominant Hand: Right    Cognition  Cognition Arousal/Alertness: Awake/alert Behavior During Therapy: WFL for tasks assessed/performed Overall Cognitive Status: Within Functional Limits for tasks assessed    Extremity/Trunk Assessment Right Upper Extremity Assessment RUE ROM/Strength/Tone: Within functional levels RUE Coordination: WFL - gross/fine motor Left Upper Extremity Assessment LUE ROM/Strength/Tone: Within functional levels LUE Coordination: WFL - gross/fine motor Right Lower Extremity Assessment RLE ROM/Strength/Tone: Deficits RLE ROM/Strength/Tone Deficits: generalized weakness, 4/5 Left Lower Extremity Assessment LLE ROM/Strength/Tone: Deficits LLE ROM/Strength/Tone Deficits: generalized weakness, grossly 4/5 Trunk Assessment Trunk Assessment: Kyphotic   Balance    End of Session PT - End of Session Equipment Utilized During Treatment: Gait belt Activity Tolerance: Patient limited by fatigue (EF = < 20%) Patient left: in chair;with call bell/phone within reach  GP     [name] 01/23/2013, 11:30 AM  Lewis Shock, PT, DPT Pager #: [phone] Office #: [phone]

## 2013-01-23 NOTE — Care Management Note (Addendum)
    Page 1 of 2   01/24/2013     3:00:37 PM   CARE MANAGEMENT NOTE 01/24/2013  Patient:  Frances [name], Frances [name]   Account Number:  [id number]  Date Initiated:  01/23/2013  Documentation initiated by:  Junius Creamer  Subjective/Objective Assessment:   adm w sepsis     Action/Plan:   lives alone, pcp dr Carmin Muskrat, hx adv homecare. has aid w [facility]   Anticipated DC Date:  01/24/2013   Anticipated DC Plan:  HOME W HOME HEALTH SERVICES      DC Planning Services  CM consult      [facility] Choice  HOME HEALTH   Choice offered to / List presented to:  C-1 Patient        HH arranged  HH-1 RN  HH-2 PT      [facility] agency  [facility]   Status of service:  Completed, signed off Medicare Important Message given?   (If response is "NO", the following Medicare IM given date fields will be blank) Date Medicare IM given:   Date Additional Medicare IM given:    Discharge Disposition:  HOME W HOME HEALTH SERVICES  Per UR Regulation:  Reviewed for med. necessity/level of care/duration of stay  If discussed at Long Length of Stay Meetings, dates discussed:    Comments:  4/28 1006 debbie dowell rn,bsn  01/24/13 Jerimiah Wolman,RN,BSN [phone] PT FOR DC HOME TODAY.  PTA, PT LIVES AT HOME ALONE, BUT HAS HHA FROM MAXIM 2 HRS PER DAY.  PT STATES SHE HAS ONE SON, WHO LIVES IN [place].  PAST HX OF HH WITH [facility].  WILL ARRANGE HH SERVICES TO RESUME.  START OF CARE 24-48H POST DC DATE.  PT STATES SHE HAS WALKER AT HOME.

## 2013-01-23 NOTE — Progress Notes (Signed)
Patient ID: Frances [name], female   DOB: [birth year], 79 y.o.   MRN: [id number] Family Medicine Teaching Service Daily Progress Note Service Page: [phone]   Subjective:  Patient has no complaints other than her breakfast is "horrible." Nurse offered to order something else and she declined. She wants to go home. She denies pain of any type, nausea or shortness of breath.   Objective: Temp:  [95.5 F (35.3 C)-98.3 F (36.8 C)] 98.3 F (36.8 C) (04/28 0400) Pulse Rate:  [57-71] 67 (04/28 0000) Cardiac Rhythm:  [-]  Resp:  [14-26] 19 (04/28 0000) BP: (67-185)/(30-94) 149/52 mmHg (04/28 0000) SpO2:  [97 %-100 %] 98 % (04/28 0400) Weight:  [134 lb 14.7 oz (61.2 kg)] 134 lb 14.7 oz (61.2 kg) (04/27 1500)  Intake/Output Summary (Last 24 hours) at 01/23/13 0653 Last data filed at 01/23/13 1478  Gross per 24 hour  Intake    200 ml  Output    350 ml  Net   -150 ml    Exam: General: alert, cooperative and no distress, non toxic appearing, conversant but very difficult to understand since edentulous  HEENT: pupils unequal bilaterally, EOMI  Heart: S1, S2 normal, no murmur, rub or gallop, regular rate and rhythm  Lungs: Bilateral crackles in bibasilar fields, no wheezes, unlabored breathing and poor respiratory effort  Abdomen: abdomen is soft without significant tenderness, masses, organomegaly or guarding  Extremities: extremities normal, atraumatic, no cyanosis or edema  Skin:no rashes  Neurology: normal without focal findings, speech difficult to understand but not aphasic, alert and oriented x 4  I have reviewed the patient's medications, labs, imaging, and diagnostic testing.  Notable results are summarized below. CBC    Component Value Date/Time   WBC 4.6 01/23/2013 0620   RBC 3.51* 01/23/2013 0620   HGB 9.5* 01/23/2013 0620   HCT 28.7* 01/23/2013 0620   PLT 184 01/23/2013 0620   MCV 81.8 01/23/2013 0620   MCH 27.1 01/23/2013 0620   MCHC 33.1 01/23/2013 0620   RDW 14.5 01/23/2013  0620   LYMPHSABS 1.2 01/22/2013 1145   MONOABS 0.6 01/22/2013 1145   EOSABS 0.2 01/22/2013 1145   BASOSABS 0.0 01/22/2013 1145     CMP     Component Value Date/Time   NA 137 01/22/2013 2340   K 3.9 01/22/2013 2340   CL 104 01/22/2013 2340   CO2 23 01/22/2013 2340   GLUCOSE 99 01/22/2013 2340   BUN 39* 01/22/2013 2340   CREATININE 2.93* 01/22/2013 2340   CREATININE 6.30* 10/17/2012 1524   CALCIUM 8.6 01/22/2013 2340   PROT 6.7 01/22/2013 2340   ALBUMIN 2.8* 01/22/2013 2340   AST 13 01/22/2013 2340   ALT 8 01/22/2013 2340   ALKPHOS 88 01/22/2013 2340   BILITOT 0.3 01/22/2013 2340   GFRNONAA 14* 01/22/2013 2340   GFRAA 16* 01/22/2013 2340    Dg Chest Port 1 View  01/22/2013  *RADIOLOGY REPORT*  Clinical Data: Hypotensive  PORTABLE CHEST - 1 VIEW  Comparison: 10/10/2012  Findings: Hypoventilation with bibasilar atelectasis.  Negative for heart failure.  No significant effusion or mass lesion identified.  IMPRESSION: Hypoventilation with bibasilar atelectasis.   Original Report Authenticated By: Frances [name], M.D.    Assessment/Plan: Frances [name] is a 79 y.o. year old female with extensive PMH including severe dilate cardiomyopathy and Stage IV CKD presenting with hypotension and hypothermia likely caused by urinary tract infection.   # Sepsis - Hypotension to 60's/30's resolved with 2L fluid; Hypothermia to 95.7 rectally;  Source of infection likely urine - last culture positive for E. Coli (January 2014) but considered colonization  - Continue with broad spectrum anitbiotics of Vanc and Zosyn  - Follow up urine and blood cultures --> Pending  - polypharmacy may be causative of hypotension as well since patient recently started taking furosemide and clonidine again according to PCP notes from 4/25 --> Currently only o lower dose clonidine, will add back BP meds slowly as needed.   # Cardiac - History of dilated cardiomyopathy (EF < 20% Echo Jan 2014), tachy-brady syndrome and cardio-renal syndrome;  Patient refused cardiac catheter and pacemaker on previous admission; EKG on admission little changes, trop I neg and patient without complaints of chest pain  - Hold home  furosemide, hdyralazine, Imdur, and metoprolol  - Watch carefully for volume overload given 2L NS bolus in ED; provide lasix if necessary  - Cont nitro PRN, ASA and statin   # Renal - Chronic CKD stage IV-V, baseline creatinine 3.5; Frances [name] of [facility] is nephrologist  - Stable renal disease right now, follow daily  - Cont sensipar for secondary hyperparathyroidism   # Social - Patient with no family and sole support is caregiver who some for 1hr and 45 minutes daily.  - Set up HCPOA for Frances [name] to make medical decisions  - PT/OT consulted, awaiting recommendations today  FENGI - Mechanical soft diet since edentulous; stop IVF since BP now stable and severe CHF  PPX - Heparin [place] TID  DISPO - Will transfer to telemetry bed today since VSS are stable, awaiting PT recommendations for possible discharge tomorrow  Frances Pacini, DO

## 2013-01-23 NOTE — Evaluation (Signed)
Occupational Therapy Evaluation Patient Details Name: Frances [name] MRN: [id number] DOB: 08-Nov-1928 Today's Date: 01/23/2013 Time: 8295-6213 OT Time Calculation (min): 32 min  OT Assessment / Plan / Recommendation Clinical Impression   This 79 y.o. Female admitted with generalized weakness, dizziness and FTT.  Pt. Diagnosed with Sepsis due to possible UTI.  Pt. Has caregiver 1.5-2 hours/day, but otherwise lives alone.  She appears to be close to baseline, but would benefit from continued OT to maximize independence and safety with BADLs.  Pt. Likely would benefit from increased assist at home as she is likely functioning at marginal level, but uncertain that she would agree to ALF.      OT Assessment  Patient needs continued OT Services    Follow Up Recommendations  No OT follow up;Supervision - Intermittent    Barriers to Discharge Decreased caregiver support    Equipment Recommendations  None recommended by OT    Recommendations for Other Services    Frequency  Min 2X/week    Precautions / Restrictions Restrictions Weight Bearing Restrictions: No   Pertinent Vitals/Pain     ADL  Eating/Feeding: Independent Where Assessed - Eating/Feeding: Chair Grooming: Wash/dry hands;Supervision/safety Where Assessed - Grooming: Unsupported standing Upper Body Bathing: Set up Where Assessed - Upper Body Bathing: Unsupported sitting Lower Body Bathing: Supervision/safety Where Assessed - Lower Body Bathing: Unsupported sit to stand Upper Body Dressing: Set up Where Assessed - Upper Body Dressing: Unsupported sitting Lower Body Dressing: Supervision/safety Where Assessed - Lower Body Dressing: Unsupported sit to stand Toilet Transfer: Supervision/safety Toilet Transfer Method: Stand pivot Acupuncturist: Comfort height toilet Toileting - Architect and Hygiene: Supervision/safety Where Assessed - Engineer, mining and Hygiene:  Standing Transfers/Ambulation Related to ADLs: S in room; min guard assist in hallway.  Pt fatigues as she reports she doesn't get out of her house much ADL Comments: Pt is able to perform BADLs with supervision    OT Diagnosis: Generalized weakness  OT Problem List: Decreased strength;Decreased activity tolerance OT Treatment Interventions: Self-care/ADL training;DME and/or AE instruction;Patient/family education   OT Goals Acute Rehab OT Goals OT Goal Formulation: With patient Time For Goal Achievement: 01/30/13 Potential to Achieve Goals: Good ADL Goals Pt Will Perform Grooming: with modified independence;Standing at sink ADL Goal: Grooming - Progress: Goal set today Pt Will Perform Upper Body Bathing: with modified independence;Sitting, chair;Sitting, edge of bed ADL Goal: Upper Body Bathing - Progress: Goal set today Pt Will Perform Lower Body Bathing: with modified independence;Sit to stand from chair;Sit to stand from bed ADL Goal: Lower Body Bathing - Progress: Goal set today Pt Will Perform Upper Body Dressing: with modified independence;Sitting, chair;Sitting, bed ADL Goal: Upper Body Dressing - Progress: Goal set today Pt Will Perform Lower Body Dressing: with modified independence;Sit to stand from chair;Sit to stand from bed ADL Goal: Lower Body Dressing - Progress: Goal set today Pt Will Transfer to Toilet: with modified independence;Ambulation;Comfort height toilet ADL Goal: Toilet Transfer - Progress: Goal set today Pt Will Perform Toileting - Clothing Manipulation: with modified independence;Standing ADL Goal: Toileting - Clothing Manipulation - Progress: Goal set today Pt Will Perform Tub/Shower Transfer: with supervision;Ambulation;Shower seat with back ADL Goal: Tub/Shower Transfer - Progress: Goal set today  Visit Information  Last OT Received On: 01/23/13 Assistance Needed: +1    Subjective Data  Subjective: "I don't get out of my hosue much" Patient Stated  Goal: To go home   Prior Functioning     Home Living Lives With: Alone Available  Help at Discharge: Available PRN/intermittently;Personal care attendant (1.5-2 hours/day) Type of Home: House Home Access: Stairs to enter [employer] of Steps: 6 Entrance Stairs-Rails: Right Home Layout: One level Bathroom Shower/Tub: Engineer, manufacturing systems: Standard Home Adaptive Equipment: Shower chair with back;Straight cane;Walker - rolling;Other (comment) (lifeline) Additional Comments: Pt reports social services comes once a month to assist with groceries Prior Function Level of Independence: Needs assistance Needs Assistance: Meal Prep;Light Housekeeping Meal Prep: Moderate Light Housekeeping: Moderate Able to Take Stairs?: Yes Driving: No Communication Communication: Expressive difficulties (Pt very difficult to understand) Dominant Hand: Right         Vision/Perception Vision - History Baseline Vision: Wears glasses all the time Visual History: Other (comment) (Pt with difficulty sustaining gaze - eyes pull Rt and down) Patient Visual Report: No change from baseline Vision - Assessment Eye Alignment: Impaired (comment) Vision Assessment: Vision not tested Perception Perception: Within Functional Limits Praxis Praxis: Intact   Cognition  Cognition Arousal/Alertness: Awake/alert Behavior During Therapy: WFL for tasks assessed/performed Overall Cognitive Status: Within Functional Limits for tasks assessed    Extremity/Trunk Assessment Right Upper Extremity Assessment RUE ROM/Strength/Tone: Within functional levels RUE Coordination: WFL - gross/fine motor Left Upper Extremity Assessment LUE ROM/Strength/Tone: Within functional levels LUE Coordination: WFL - gross/fine motor Trunk Assessment Trunk Assessment: Kyphotic     Mobility Bed Mobility Bed Mobility: Supine to Sit;Sitting - Scoot to Edge of Bed Supine to Sit: 5: Supervision;With rails;HOB  flat Sitting - Scoot to Edge of Bed: 6: Modified independent (Device/Increase time) Details for Bed Mobility Assistance: requires increased time Transfers Transfers: Sit to Stand;Stand to Sit Sit to Stand: 5: Supervision;From bed;With upper extremity assist;From toilet Stand to Sit: With upper extremity assist;To chair/3-in-1;To toilet;5: Supervision     Exercise     Balance     End of Session OT - End of Session Activity Tolerance: Patient tolerated treatment well Patient left: in chair;with call bell/phone within reach;with nursing in room Nurse Communication: Mobility status  GO     Jeani Hawking M 01/23/2013, 10:16 AM

## 2013-01-23 NOTE — Progress Notes (Signed)
Case discussed with Dr Claiborne Billings and rest of resident team, and I agree with assessment and plan.  Paula Compton, MD

## 2013-01-23 NOTE — Progress Notes (Signed)
Pharmacist Heart Failure Core Measure Documentation  Assessment: Frances [name] has an EF documented as <20 on EMR by echo.  Rationale: Heart failure patients with left ventricular systolic dysfunction (LVSD) and an EF < 40% should be prescribed an angiotensin converting enzyme inhibitor (ACEI) or angiotensin receptor blocker (ARB) at discharge unless a contraindication is documented in the medical record.  This patient is not currently on an ACEI or ARB for HF.  This note is being placed in the record in order to provide documentation that a contraindication to the use of these agents is present for this encounter.  ACE Inhibitor or Angiotensin Receptor Blocker is contraindicated (specify all that apply)  []   ACEI allergy AND ARB allergy []   Angioedema []   Moderate or severe aortic stenosis []   Hyperkalemia []   Hypotension []   Renal artery stenosis [x]   Worsening renal function, preexisting renal disease or dysfunction   Frances [name] 01/23/2013 11:52 AM

## 2013-01-23 NOTE — H&P (Signed)
I have seen and examined this patient. I have discussed with Dr Clinton Sawyer.  I agree with their findings and plans as documented in their admission note. History details are limited by patient's impaired phonation, though responses seemed to be relevant and appropriate to questions  1. Possible Urinary tract infection, complicated - Hypothermia, Mental Status change (hallucinations), Functional change with (+) Nitrite and (+) LE with 7-10 WBC on microscopy. - History of pansensitive E. Coli UTI 10/12/12 -Plan: - Empiric vanc/zosyn to cover enterobacteriacea and enterococcus - F/U Urine culture  2. Hypotension - Possible origins: medication-induced (clonidine and furosemide restarted recently on her hydralazine, imdur and metoprolol); Sepsis secondary to number #1, systolic heart failure.  - Resolved with IVF resuscitation. - Plan: - Hold antihypertensive and diuretic medications; restart as needed.   3. Possible Cognitive Impairment - Mini-Cog: 1 out of 3 recall - Pt dependent in ADLs of bathing and dressing - Plan: MMSE without reading "close your eyes" (1 pt), writing a sentence (1 pt), or copy the figure (1 pt) - Adjust the patient's adjusted score, x, using formula   [name] raw score * (30/27) - If impaired cognition present, then measure TSH and Vit B12.

## 2013-01-24 LAB — BASIC METABOLIC PANEL
BUN: 35 mg/dL — ABNORMAL HIGH (ref 6–23)
CO2: 23 mEq/L (ref 19–32)
Calcium: 8.6 mg/dL (ref 8.4–10.5)
Creatinine, Ser: 3.12 mg/dL — ABNORMAL HIGH (ref 0.50–1.10)
GFR calc non Af Amer: 13 mL/min — ABNORMAL LOW (ref >90)
Glucose, Bld: 113 mg/dL — ABNORMAL HIGH (ref 70–99)

## 2013-01-24 LAB — URINE CULTURE: Colony Count: >=100000

## 2013-01-24 MED ORDER — VITAMIN D 400 UNITS PO TABS: 800.0000 [IU] | ORAL_TABLET | Freq: Every day | ORAL | Status: DC

## 2013-01-24 MED ORDER — CHOLECALCIFEROL 10 MCG (400 UNIT) PO TABS
800.0000 [IU] | ORAL_TABLET | Freq: Every day | ORAL | Status: DC
  Administered 2013-01-24: 800 [IU] via ORAL
  Filled 2013-01-24: qty 2

## 2013-01-24 MED ORDER — FUROSEMIDE 20 MG PO TABS: 20.0000 mg | ORAL_TABLET | Freq: Two times a day (BID) | ORAL | Status: DC

## 2013-01-24 MED ORDER — CEPHALEXIN 500 MG PO CAPS: 500.0000 mg | ORAL_CAPSULE | Freq: Two times a day (BID) | ORAL | Status: AC

## 2013-01-24 MED ORDER — CEPHALEXIN 500 MG PO CAPS
500.0000 mg | ORAL_CAPSULE | Freq: Two times a day (BID) | ORAL | Status: DC
  Administered 2013-01-24: 500 mg via ORAL
  Filled 2013-01-24 (×2): qty 1

## 2013-01-24 NOTE — Discharge Summary (Signed)
Physician Discharge Summary  Patient ID: CAIDANCE SYBERT MRN: [id number] DOB/AGE: 1929/06/25 79 y.o.  Admit date: 01/22/2013 Discharge date: 01/24/2013  Admission Diagnoses: Weakness History of Present Illness:  Frances [name] is a 79 y.o. year old female with extensive PMH including dilated cardiomyopathy and stage IV CKD presenting with several days of weakness accompanied by hallucinations. Upon arrival to the ED, the patient's BP was 67/30, but she was mentating well according to the ED team. Urine and blood cultures were drawn. She was given 2L of normal saline and pressures increased to > 100/50. Her urinalysis showed + nitrites, leukocytes, and bacteria. Therefore, was given 1 g ceftriaxone. However, thereafter, her rectal temperature was found to be 95.7, so she was started on broad spectrum antibiotics of Vancomycin and Zosyn. She was admitted to stepdown for sepsis protocol.   # Sepsis - Hypotension to 60's/30's resolved with 2L fluid; Hypothermia to 95.7 rectally, but since stable; Source of infection proved to be urine, with culture of >100K colonies of klebsiella. Placed on broad specturm broad spectrum anitbiotics of Vanc and Zosyn until cultures returned, then transitioned to keflex for 7 days.  Blood cultures have no growth to date. Polypharmacy may be causative of hypotension as well since patient recently started taking furosemide and clonidine again according to PCP notes from 4/25. Patient was discharged on lasix 20 BID, metoprolol Succinate 25 BID. Clonidine, Imdur, and lasix 80 was discontinued during hospital and discharge. BP remained stable on the mentioned regimen.   # Cardiac - History of dilated cardiomyopathy (EF < 20% Echo Jan 2014), tachy-brady syndrome and cardio-renal syndrome; Patient refused cardiac catheter and pacemaker on previous admission; EKG on admission little changes, trop I neg and patient without complaints of chest pain. Hold home furosemide, hdyralazine, Imdur,  and metoprolol initially adn then added back above medications. Continued on  nitro PRN, ASA and statin   # Renal - Chronic CKD stage IV-V, baseline creatinine 3.5; Dr. Briant Cedar of [facility] is nephrologist. Stable renal disease right now, patient does not need dialysis as of yet. Vitamin D low at 13 patient was given daily supplements of D3 and will need follow up on level in 4-6 weeks.   # Social - Patient with no family and sole support is caregiver who some for 1hr and 45 minutes daily. Patient discharged with home PT intermittently.   Discharge Diagnoses:  Principal Problem:   Sepsis(995.91) Active Problems:   CHRONIC KIDNEY DISEASE STAGE IV (SEVERE)   Congestive dilated cardiomyopathy, EF < 20%   Hypothermia   Hypotension, unspecified   Discharged Condition: good  Consults: None  Significant Diagnostic Studies:  Dg Chest Port 1 View  01/22/2013  *RADIOLOGY REPORT*  Clinical Data: Hypotensive  PORTABLE CHEST - 1 VIEW  Comparison: 10/10/2012  Findings: Hypoventilation with bibasilar atelectasis.  Negative for heart failure.  No significant effusion or mass lesion identified.  IMPRESSION: Hypoventilation with bibasilar atelectasis.   Original Report Authenticated By: Janeece Riggers, M.D.    Urinalysis    Component Value Date/Time   COLORURINE YELLOW 01/22/2013 1254   APPEARANCEUR HAZY* 01/22/2013 1254   LABSPEC 1.010 01/22/2013 1254   PHURINE 6.0 01/22/2013 1254   GLUCOSEU NEGATIVE 01/22/2013 1254   HGBUR NEGATIVE 01/22/2013 1254   HGBUR negative 01/31/2009 0952   BILIRUBINUR NEGATIVE 01/22/2013 1254   KETONESUR NEGATIVE 01/22/2013 1254   PROTEINUR 30* 01/22/2013 1254   UROBILINOGEN 0.2 01/22/2013 1254   NITRITE POSITIVE* 01/22/2013 1254   LEUKOCYTESUR MODERATE* 01/22/2013 1254   >  100K colonies of Klebsiella in urine culture, sensitive to keflex Treatments: IV hydration and antibiotics: vancomycin, Zosyn and keflex   Discharge Exam: Blood pressure 153/75, pulse 68,  temperature 98.1 F (36.7 C), temperature source Oral, resp. rate 18, height 4' 11.84" (1.52 m), weight 134 lb 14.7 oz (61.2 kg), SpO2 100.00%. General: alert, cooperative and no distress, non toxic appearing, conversant but very difficult to understand since edentulous; sitting on side of bed eating breakfast.  HEENT: pupils unequal bilaterally, EOMI  Heart: S1, S2 normal, no murmur, rub or gallop, regular rate and rhythm  Lungs: Bilateral crackles in bibasilar fields again today, no wheezes, unlabored breathing and poor respiratory effort  Abdomen: abdomen is soft without significant tenderness, masses, organomegaly or guarding  Extremities: extremities normal, atraumatic, no cyanosis or edema  Skin:no rashes  Neurology: normal without focal findings, speech difficult to understand but not aphasic, alert and oriented x 4   Disposition: 06-Home-Health Care Svc      Discharge Orders   Future Appointments Provider Department Dept Phone   02/06/2013 1:30 PM Natalia Leatherwood, DO [facility] [phone]   Future Orders Complete By Expires     Diet - low sodium heart healthy  As directed     Discharge instructions  As directed     Comments:      Patient has follow up with Dr. Claiborne Billings scheduled. [place] is out of town    Increase activity slowly  As directed         Medication List    STOP taking these medications       acetaminophen 500 MG tablet  Commonly known as:  TYLENOL     cloNIDine 0.2 MG tablet  Commonly known as:  CATAPRES     hydrALAZINE 25 MG tablet  Commonly known as:  APRESOLINE     isosorbide mononitrate 30 MG 24 hr tablet  Commonly known as:  IMDUR      TAKE these medications       aspirin EC 81 MG tablet  Take 81 mg by mouth daily.     atorvastatin 40 MG tablet  Commonly known as:  LIPITOR  Take 1 tablet (40 mg total) by mouth at bedtime.     cephALEXin 500 MG capsule  Commonly known as:  KEFLEX  Take 1 capsule (500 mg total) by  mouth 2 (two) times daily.     cinacalcet 60 MG tablet  Commonly known as:  SENSIPAR  Take 60 mg by mouth daily.     furosemide 20 MG tablet  Commonly known as:  LASIX  Take 1 tablet (20 mg total) by mouth 2 (two) times daily.     metoprolol tartrate 25 MG tablet  Commonly known as:  LOPRESSOR  Take 25 mg by mouth 2 (two) times daily.     nitroGLYCERIN 0.4 MG SL tablet  Commonly known as:  NITROSTAT  Place 0.4 mg under the tongue every 5 (five) minutes as needed for chest pain.     vitamin D (CHOLECALCIFEROL) 400 UNITS tablet  Take 2 tablets (800 Units total) by mouth daily.       Follow-up Information   Follow up with Felix Pacini, DO On 02/06/2013. (@1 :30)    Contact information:   [address] [place] [address] [phone]      Outpatient recommendations:  - Social Note - Patient is a widow with one son in [place] who is estranged. When asked if she wanted me  to contact her son about medical decisions in case she became too sick to make her own decisions, she vehemently said no. She said that she would like her caregiver Antionette Zachery Dauer [phone]) to make those decision. Her caregiver was at bedside and agreeable to this plan. She says they have tried in the past to obtain HCPOA, but the patient cannot afford to do so.  - Follow up D3 level in 4-6 weeks; patient placed on 800 mg /d supplementation.  - Patient was discharged on lasix 20 BID, metoprolol tartrate 25 BID only and blood pressures were stable.  - Home health PT on discharge  Signed: Felix Pacini 01/24/2013, 3:58 PM

## 2013-01-24 NOTE — Progress Notes (Signed)
Physical Therapy Treatment Patient Details Name: Frances [name] MRN: [id number] DOB: [birth year] Today's Date: 01/24/2013 Time: 4098-1191 PT Time Calculation (min): 16 min  PT Assessment / Plan / Recommendation Comments on Treatment Session  Pt demonostrates increased ambulation today. Making steady progress towards PT goals at this time.    Follow Up Recommendations  Home health PT;Supervision - Intermittent     Does the patient have the potential to tolerate intense rehabilitation     Barriers to Discharge        Equipment Recommendations  None recommended by PT    Recommendations for Other Services    Frequency Min 3X/week   Plan Discharge plan remains appropriate    Precautions / Restrictions Precautions Precautions: Fall Restrictions Weight Bearing Restrictions: No   Pertinent Vitals/Pain No pain at this time    Mobility  Bed Mobility Bed Mobility: Supine to Sit;Sitting - Scoot to Edge of Bed Supine to Sit: 5: Supervision;With rails;HOB flat Sitting - Scoot to Edge of Bed: 6: Modified independent (Device/Increase time) Details for Bed Mobility Assistance: requires increased time Transfers Transfers: Sit to Stand;Stand to Sit Sit to Stand: 4: Min guard Stand to Sit: 4: Min guard;With upper extremity assist;To chair/3-in-1 Details for Transfer Assistance: increased time but safe technique Ambulation/Gait Ambulation/Gait Assistance: 4: Min guard Ambulation Distance (Feet): 200 Feet Assistive device: Rolling walker Ambulation/Gait Assistance Details: VCs for proper positioning and use of rw. Pt very impulsive; multiple breaks to control speed and activity. Gait Pattern: Step-to pattern;Decreased stride length;Shuffle;Trunk flexed;Narrow base of support Gait velocity: slow General Gait Details: overall steady but unreceptive to safe techniques for use of rw. Stairs: No      PT Goals Acute Rehab PT Goals PT Goal Formulation: With patient Time For Goal  Achievement: 01/30/13 Potential to Achieve Goals: Good Pt will go Supine/Side to Sit: with modified independence;with HOB 0 degrees PT Goal: Supine/Side to Sit - Progress: Progressing toward goal Pt will go Sit to Stand: with modified independence;with upper extremity assist PT Goal: Sit to Stand - Progress: Progressing toward goal Pt will Ambulate: 51 - 150 feet;with modified independence;with cane PT Goal: Ambulate - Progress: Progressing toward goal Pt will Go Up / Down Stairs: 6-9 stairs;with supervision;with rail(s) Pt will Perform Home Exercise Program: with supervision, verbal cues required/provided  Visit Information  Last PT Received On: 01/24/13 Assistance Needed: +1    Subjective Data  Subjective: I slept well   Cognition  Cognition Arousal/Alertness: Awake/alert Behavior During Therapy: WFL for tasks assessed/performed Overall Cognitive Status: Within Functional Limits for tasks assessed       End of Session PT - End of Session Equipment Utilized During Treatment: Gait belt Activity Tolerance: Patient limited by fatigue Patient left: in chair;with call bell/phone within reach;with family/visitor present   GP     Fabio Asa 01/24/2013, 11:13 AM Charlotte Crumb, PT DPT  [phone]

## 2013-01-24 NOTE — Progress Notes (Signed)
Pt had 5.4 sec sinus pause. Pt asymptomatic. MD notifies no new orders received.

## 2013-01-24 NOTE — Progress Notes (Signed)
FMTS Attending Note Patient seen and examined by me today, I have discussed with Dr Claiborne Billings and I agree with her assessment and plan for today. Agree with plan for discharge later today.  Paula Compton, MD

## 2013-01-24 NOTE — Progress Notes (Signed)
Patient ID: Frances [name], female   DOB: 21-Apr-1929, 79 y.o.   MRN: [id number] Family Medicine Teaching Service Daily Progress Note Service Page: [phone]   Subjective:  Patient  reports she is doing "good" this morning. She has eaten a complete breakfast and tolerated it nicely.   Objective: BP 150/59  Pulse 66  Temp(Src) 98.1 F (36.7 C) (Oral)  Resp 16  Ht 4' 11.84" (1.52 m)  Wt 134 lb 14.7 oz (61.2 kg)  BMI 26.49 kg/m2  SpO2 100% Intake/Output Summary (Last 24 hours) at 01/23/13 1478 Last data filed at 01/23/13 2956  Intake/Output Summary (Last 24 hours) at 01/24/13 0958 Last data filed at 01/24/13 0600  Gross per 24 hour  Intake    340 ml  Output   1101 ml  Net   -761 ml   Exam: General: alert, cooperative and no distress, non toxic appearing, conversant but very difficult to understand since edentulous; sitting on side of bed eating breakfast.  HEENT: pupils unequal bilaterally, EOMI  Heart: S1, S2 normal, no murmur, rub or gallop, regular rate and rhythm  Lungs: Bilateral crackles in bibasilar fields again today, no wheezes, unlabored breathing and poor respiratory effort  Abdomen: abdomen is soft without significant tenderness, masses, organomegaly or guarding  Extremities: extremities normal, atraumatic, no cyanosis or edema  Skin:no rashes  Neurology: normal without focal findings, speech difficult to understand but not aphasic, alert and oriented x 4  I have reviewed the patient's medications, labs, imaging, and diagnostic testing.  Notable results are summarized below. BMET    Component Value Date/Time   NA 137 01/22/2013 2340   K 3.9 01/22/2013 2340   CL 104 01/22/2013 2340   CO2 23 01/22/2013 2340   GLUCOSE 99 01/22/2013 2340   BUN 39* 01/22/2013 2340   CREATININE 2.93* 01/22/2013 2340   CREATININE 6.30* 10/17/2012 1524   CALCIUM 8.6 01/22/2013 2340   GFRNONAA 14* 01/22/2013 2340   GFRAA 16* 01/22/2013 2340     Dg Chest Port 1 View  01/22/2013  *RADIOLOGY  REPORT*  Clinical Data: Hypotensive  PORTABLE CHEST - 1 VIEW  Comparison: 10/10/2012  Findings: Hypoventilation with bibasilar atelectasis.  Negative for heart failure.  No significant effusion or mass lesion identified.  IMPRESSION: Hypoventilation with bibasilar atelectasis.   Original Report Authenticated By: Janeece Riggers, M.D.    Assessment/Plan: Frances [name] is a 79 y.o. year old female with extensive PMH including severe dilate cardiomyopathy and Stage IV CKD presenting with hypotension and hypothermia likely caused by urinary tract infection. Vitals have been stable overnight and patient is tolerating meals.   # Sepsis - Hypotension to 60's/30's resolved with 2L fluid; Hypothermia to 95.7 rectally, but since stable; Source of infection likely urine - last culture positive for E. Coli (January 2014) but considered colonization  - Continue with broad spectrum anitbiotics of Vanc and Zosyn --> vanc dc'd yesterday with Urine cx >100k gram negative rods (speciation pending) - Urine cx: 100k >100k gram negative - blood cultures: NGTD - polypharmacy may be causative of hypotension as well since patient recently started taking furosemide and clonidine again according to PCP notes from 4/25 --> Currently only on lower dose clonidine, will add back BP meds slowly as needed.   # Cardiac - History of dilated cardiomyopathy (EF < 20% Echo Jan 2014), tachy-brady syndrome and cardio-renal syndrome; Patient refused cardiac catheter and pacemaker on previous admission; EKG on admission little changes, trop I neg and patient without complaints of chest pain  -  Hold home  furosemide, hdyralazine, Imdur, and metoprolol  - Watch carefully for volume overload given 2L NS bolus in ED; provide lasix if necessary  - Cont nitro PRN, ASA and statin   # Renal - Chronic CKD stage IV-V, baseline creatinine 3.5; Dr. Briant Cedar of [facility] is nephrologist  - Stable renal disease right now no dialysis,  follow daily  - Cont sensipar for secondary hyperparathyroidism  - Vitamin D low at 13--> will supplement today  # Social - Patient with no family and sole support is caregiver who some for 1hr and 45 minutes daily.  - Set up HCPOA for Frances [name] to make medical decisions   FENGI - Mechanical soft diet since edentulous; stop IVF since BP now stable and severe CHF  PPX - Heparin [place] TID  DISPO - probable discharge today with home health PT  Felix Pacini, DO

## 2013-01-24 NOTE — Progress Notes (Addendum)
01/24/2013 3:58 PM Nursing note Discharge avs form, medications already taken today and those due this evening given and explained to patient and caregiver. Follow up appointments and location of called in RX explained as well. Pt. Verbalized correct use of SL NTG. D/c iv line by NT. D/c tele. Home health PT arrangements with [facility] reviewed. D/c home with caregiver per orders. Confirmed with Dr. Claiborne Billings that it was ok for patient to resume home dose of Metoprolol after dose held this am. Verbal orders ok for patient to resume per discharge avs instructions. Pt. Updated on this information. Pt. Verbalized understanding.  Joselle Deeds, Blanchard Kelch

## 2013-01-28 LAB — CULTURE, BLOOD (ROUTINE X 2)

## 2013-02-06 ENCOUNTER — Ambulatory Visit (INDEPENDENT_AMBULATORY_CARE_PROVIDER_SITE_OTHER): Payer: Medicare Other | Admitting: Family Medicine

## 2013-02-06 VITALS — BP 191/63 | HR 70 | Temp 97.9°F | Ht 59.0 in | Wt 131.0 lb

## 2013-02-06 DIAGNOSIS — I10 Essential (primary) hypertension: Secondary | ICD-10-CM

## 2013-02-06 DIAGNOSIS — R531 Weakness: Secondary | ICD-10-CM

## 2013-02-06 DIAGNOSIS — R5381 Other malaise: Secondary | ICD-10-CM

## 2013-02-06 NOTE — Assessment & Plan Note (Addendum)
-   Patient with 190"s systolic today. Frances [name] this is her home baseline, considering her home medications prior to admission to hospital being quite different then what was required during hospitalization.  - reviewed medications with her and made certain she start taking her lasix BID as well. Sounds as if she does not like urinating that many times and has been only taking it once a day.  - AVS on lower sodium diet, she has been eating microwave-able meals since she can no longer cook. Uncertain if these are prepared by [facility] or are frozen meals.  -F /u: 3-4 weeks with PCP for BP check after she starts lowering salt and taking her lasix BID.

## 2013-02-06 NOTE — Progress Notes (Signed)
Subjective:     Patient ID: Frances [name], female   DOB: [birth year], 79 y.o.   MRN: [id number]  HPI F/U to hospital stay for UTI and weakness: Patient reports she is feeling much better than when she was in the hospital. She reports she is taking her medications as prescribed, but then reported she thought lasix was once a day. In the hospital her BP was controled well. She denies dysuria, pain  or confusion. She is still feeling tired, but PT has been out to work with her. She has a caretaker that comes into the home for about 2.5 hours a day and helps her with cooking and medications. She does not cook anymore, because she "burnt up her chicken" once and she is fearful to use it anymore. Frances [name] does the cooking and she now microwaves her meals. This may be a reason why her BP is less controled outpatient (Sodium).   She is taking her Vit. D and will need level checked in 3-4 weeks.   Review of Systems No dizziness, lightheadedness or chest pain.     Objective:   Physical Exam BP 191/63  Pulse 70  Temp(Src) 97.9 F (36.6 C) (Oral)  Ht 4\' 11"  (1.499 m)  Wt 131 lb (59.421 kg)  BMI 26.44 kg/m2 Gen: Pleasant and talkative. Happy. Walking well with cane. Alert and oriented x4.  CV: RRR. No murmur Chest: Very mild crackles bilateral bases. Otherwise clear. ABd: Soft, obese. NTND. BS present. Bruising from SQ heparin while in patient.  EXT: No erythema, trace edema, no tenderness.

## 2013-02-06 NOTE — Assessment & Plan Note (Signed)
-   She reports she still feels tired, but better than when she was in the hopsital - Home pT has been out to work with her.

## 2013-02-06 NOTE — Patient Instructions (Signed)

## 2013-02-22 ENCOUNTER — Other Ambulatory Visit: Payer: Self-pay | Admitting: *Deleted

## 2013-02-22 MED ORDER — FUROSEMIDE 20 MG PO TABS: 20.0000 mg | ORAL_TABLET | Freq: Two times a day (BID) | ORAL | Status: DC

## 2013-02-22 NOTE — Telephone Encounter (Signed)
Requested Prescriptions   Pending Prescriptions Disp Refills  . furosemide (LASIX) 20 MG tablet 60 tablet 0    Sig: Take 1 tablet (20 mg total) by mouth 2 (two) times daily.

## 2013-03-21 ENCOUNTER — Telehealth: Payer: Self-pay | Admitting: *Deleted

## 2013-03-21 NOTE — Telephone Encounter (Signed)
Fax requesting vitamin D from [facility] - please advise. Wyatt Haste, RN-BSN

## 2013-03-23 MED ORDER — VITAMIN D 400 UNITS PO TABS: 800.0000 [IU] | ORAL_TABLET | Freq: Every day | ORAL | Status: DC

## 2013-03-30 ENCOUNTER — Other Ambulatory Visit: Payer: Self-pay | Admitting: *Deleted

## 2013-03-30 ENCOUNTER — Telehealth: Payer: Self-pay | Admitting: *Deleted

## 2013-03-30 NOTE — Telephone Encounter (Signed)
Social Services FL2 form placed in your box  - please fill out and return via fax as soon as possible. Info sheet from social services attached. Wyatt Haste, RN-BSN

## 2013-04-04 NOTE — Telephone Encounter (Signed)
Done

## 2013-04-17 ENCOUNTER — Ambulatory Visit (INDEPENDENT_AMBULATORY_CARE_PROVIDER_SITE_OTHER): Payer: Medicare Other | Admitting: Family Medicine

## 2013-04-17 ENCOUNTER — Encounter: Payer: Self-pay | Admitting: Family Medicine

## 2013-04-17 VITALS — BP 164/60 | HR 80 | Temp 97.5°F | Wt 130.0 lb

## 2013-04-17 DIAGNOSIS — R200 Anesthesia of skin: Secondary | ICD-10-CM

## 2013-04-17 DIAGNOSIS — Z9181 History of falling: Secondary | ICD-10-CM

## 2013-04-17 DIAGNOSIS — R209 Unspecified disturbances of skin sensation: Secondary | ICD-10-CM

## 2013-04-17 DIAGNOSIS — I10 Essential (primary) hypertension: Secondary | ICD-10-CM

## 2013-04-17 NOTE — Progress Notes (Signed)
  Subjective:    Patient ID: Frances [name], female    DOB: 27-Jan-1929, 79 y.o.   MRN: [id number]  HPI  Right Fingers numb No longer bothers her.    HYPERTENSION Disease Monitoring Home BP Monitoring not checking Chest pain- no    Dyspnea- no Medications Compliance- Brings all her medications.  Is taking regularly Lightheadedness-  Feels dizzy when stands but no falls.  Uses cane "some"  Edema- none recenlty ROS - See HPI  Unsteady Gait Feels lightheadness when stand intermittenlty.  Uses cane when out of the house.  No recent falls or loc.  No focal weakness of legs or incontinence  Review of Systems     Objective:   Physical Exam  Alert no acute distress Her usual difficult to understand speech Able to stand walk turn without difficuty without cane and in normal time Heart - Regular rate and rhythm.  No murmurs, gallops or rubs.    Lungs:  Normal respiratory effort, chest expands symmetrically. Lungs are clear to auscultation, no crackles or wheezes. Extrem - 1 + edema      Assessment & Plan:

## 2013-04-17 NOTE — Patient Instructions (Addendum)
Your blood pressure today is 138/70 after you rest so that is fine  When you stand up use your cane and stand slowly and stop and get your balance before you walk  Come back in 6 months

## 2013-04-18 NOTE — Assessment & Plan Note (Signed)
Adequate control given her fall risk and labile nature of her hypertension

## 2013-04-18 NOTE — Assessment & Plan Note (Signed)
Stable.  She has cane and uses correctly "when she needs to"  Will be moving to an assited living facility soon

## 2013-04-18 NOTE — Assessment & Plan Note (Signed)
resolved 

## 2013-05-12 ENCOUNTER — Telehealth: Payer: Self-pay | Admitting: *Deleted

## 2013-05-12 NOTE — Telephone Encounter (Signed)
[facility] calling for NPI #.  Need authorization for 63-month f/u appt patient has with Dr. Herbie Baltimore on 05/16/13.  NPI # given.  Gaylene Brooks, RN

## 2013-05-16 ENCOUNTER — Ambulatory Visit (INDEPENDENT_AMBULATORY_CARE_PROVIDER_SITE_OTHER): Payer: Medicare Other | Admitting: Cardiology

## 2013-05-16 ENCOUNTER — Encounter: Payer: Self-pay | Admitting: Cardiology

## 2013-05-16 VITALS — BP 170/74 | Ht 60.0 in | Wt 130.0 lb

## 2013-05-16 DIAGNOSIS — I2510 Atherosclerotic heart disease of native coronary artery without angina pectoris: Secondary | ICD-10-CM

## 2013-05-16 DIAGNOSIS — I428 Other cardiomyopathies: Secondary | ICD-10-CM

## 2013-05-16 DIAGNOSIS — N184 Chronic kidney disease, stage 4 (severe): Secondary | ICD-10-CM

## 2013-05-16 DIAGNOSIS — N19 Unspecified kidney failure: Secondary | ICD-10-CM

## 2013-05-16 DIAGNOSIS — R609 Edema, unspecified: Secondary | ICD-10-CM

## 2013-05-16 DIAGNOSIS — I1310 Hypertensive heart and chronic kidney disease without heart failure, with stage 1 through stage 4 chronic kidney disease, or unspecified chronic kidney disease: Secondary | ICD-10-CM

## 2013-05-16 DIAGNOSIS — I10 Essential (primary) hypertension: Secondary | ICD-10-CM

## 2013-05-16 DIAGNOSIS — I495 Sick sinus syndrome: Secondary | ICD-10-CM

## 2013-05-16 DIAGNOSIS — R600 Localized edema: Secondary | ICD-10-CM

## 2013-05-16 NOTE — Patient Instructions (Addendum)
START taking Hydralazine 25 mg   One tablet twice a day   Your physician wants you to follow-up in 12 months Dr Herbie Baltimore. You will receive a reminder letter in the mail two months in advance. If you don't receive a letter, please call our office to schedule the follow-up appointment.

## 2013-05-18 ENCOUNTER — Encounter: Payer: Self-pay | Admitting: Cardiology

## 2013-05-19 ENCOUNTER — Other Ambulatory Visit: Payer: Self-pay | Admitting: *Deleted

## 2013-05-19 MED ORDER — HYDRALAZINE HCL 25 MG PO TABS: 25.0000 mg | ORAL_TABLET | Freq: Two times a day (BID) | ORAL | Status: DC

## 2013-05-19 NOTE — Telephone Encounter (Signed)
Rx was sent to pharmacy electronically. 

## 2013-05-21 ENCOUNTER — Encounter: Payer: Self-pay | Admitting: Cardiology

## 2013-05-21 DIAGNOSIS — R600 Localized edema: Secondary | ICD-10-CM | POA: Insufficient documentation

## 2013-05-21 DIAGNOSIS — I428 Other cardiomyopathies: Secondary | ICD-10-CM | POA: Insufficient documentation

## 2013-05-21 MED ORDER — HYDRALAZINE HCL 25 MG PO TABS: 25.0000 mg | ORAL_TABLET | Freq: Two times a day (BID) | ORAL | Status: DC

## 2013-05-21 NOTE — Assessment & Plan Note (Addendum)
With a reduced cardiac function, ensure that some of her chronic kidney disease probably related to cardiorenal syndrome.  This really only seems to be notable when she is in extremus.  Otherwise things seem to be relatively stable.

## 2013-05-21 NOTE — Assessment & Plan Note (Signed)
Well-controlled today.  She is reluctant to take her Lasix issue to be at about, some in breast that actually isn't that bad today.  She'll no low-dose of Lasix, again making it hard to believe her EF is 20%.  Plan continue current dose Lasix.  We'll also defer to Dr. Briant Cedar for further adjustment if felt to indicated.  She hasn't taken the regimen that would be to be on since I have known her.

## 2013-05-21 NOTE — Assessment & Plan Note (Addendum)
Her most recent echo Doppler showed an EF of less than 20%.  She really does not have any heart failure symptoms besides the edema.  Edema from left-sided heart failure would also correlate with PND and orthopnea, which she does not have.  Edema also predates the dramatic drop in cardiac function.  I wouldn't be apprised that if we check another echo her EF was better.  I think that drop is a transient issue. Regardless the last evaluation of her coronaries was in the catheterization in 2005 and a nuclear stress test in 2009 both of which did not show any evidence to suggest an ischemic complement.  Clearly she may have microvascular ischemia but her LVH from long-standing hypertension is probably the main cause for reduced ejection fraction.  As best I can tell, her only partly medications is to discharge home was low-dose of Lopressor (not notable succinate or carvedilol) and a much lower dose of Lasix for which she takes twice a day.  She was previously on Imdur and hydralazine for afterload reduction, since no ACE inhibitor or ARB is being used for her renal function.    Plan: Restart hydralazine 25 mg twice a day.  Would then probably need to add back the Imdur as well versus Isordil.  I am curious how her blood pressure does on this alone.    I would like to reevaluate her ejection fraction, but she's not really changed and other studies.

## 2013-05-21 NOTE — Assessment & Plan Note (Addendum)
I had restarted low-dose beta blocker, saw her back in February because she was having a lot of palpitations.  As far as I can tell those have improved.  I am however reluctant to increase the metoprolol at all.  For her blood pressure, and cardiomyopathy, I would prefer to use carvedilol.  I'm just worried that changing medications around too much for her will lead to confusion.  She has declined pacemaker on 2 occasions now.  When her EF is low this would be an ICD and not distant regular pacemaker.  She declined that as well.  In that vein she is also not indicated that she is DNR/DNI.

## 2013-05-21 NOTE — Progress Notes (Signed)
Patient ID: Frances [name], female   DOB: [birth year], 79 y.o.   MRN: [id number] PCP: Carney Living, MD  Clinic Note: Chief Complaint  Patient presents with  . 6 month visit    ,no sob,no edema,chest pain x1 lasting ,use a 4 point cane   HPI: Frances [name] is a 79 y.o. female with a PMH below who presents today for a six-month follow-up of her long-standing hypertension and cardiomyopathy.  I saw her back in February after hospitalization in January in which she had related type II MI in the setting of the hypertensive urgency.  At that time she declined cardiac catheterization.  We also discussed whether not we would do a nuclear stress test for risk stratification, but she didn't want that since she would not go forward with catheterization.  She did have an echocardiogram done that showed a significant drop in EF from roughly 45% down to less than 20%.  Also the consultation she was noted to have tachycardia and then when placed back on beta blocker had profound bradycardia with sinus pauses.  She then declined pacemaker as well as AICD she has been declined dialysis for quite sometime now for long-standing chronic kidney disease -- he is followed by Dr. Briant Cedar.  She essentially confirmed for many during that hospitalization, that she is DNR/DNI does not want to have invasive procedures performed.  She actually tells me she doesn't want to go back to the hospital.  She was then admitted again in the end of April 27 the 29th with profound weakness and hallucinations.  Her blood pressure was low in the 67/30 mmHg range.  She is found to have evidence of a UTI.  She was treated with broad-spectrum antibiotics for what amounted to be sepsis/septic shock from urinary tract infection.  She had no cardiac issues during that time.  That may have been where her hydralazine was held and not restarted. -- She was discharged on 20 mg twice a day oral Lasix, metoprolol succinate 25 mg twice a day.   Clonidine, Imdur and Lasix were all discontinued.  But somehow I think hydralazine was also discontinued  She has very labile blood pressure, as it hard to control especially without being on his beta blocker.  Every, the lateral medication.  She is not taking wiping she's on.  For this she was supposed to the hydralazine but is clearly not it when you ask her.  She's very very difficult to understand her speech impediment and is free difficult getting symptoms from her..    Interval History: She comes in today he am she lead doing okay she had one episode of month ago where she had about 30 minutes of chest pain that went away so.  This was at rest woke up with it, has not had anymore symptoms since.  No other symptoms with rest or exertion.  The tip is actually in the left upper chest just below her shoulder.  It was not made worse the same day with exertion.  She notes occasional palpitations but nothing significant.  No lightheadedness, dizziness, wooziness.  She does have occasional orthostatic dizziness but nothing that would be from tachycardia or bradycardia.  She denies any TIA or amaurosis fugax symptoms, she also denies syncope or near significant stiffness.  She says her edema is mild (which for her is 1-2 +).   It doesn't sound as if she has PND or orthopnea symptoms from what I understand what she says.  She has noted, that she is just not been eating as well as she used to.  I actually wonder if she may be aspirating some.  Melena - no, hematochezia no; hematuria - no; nosebleeds - no; claudication - no  Past Medical History  Diagnosis Date  . Coronary artery disease, non-occlusive 2005    Last cath in 2005 - no significant disease; Myoview 2009 no ischemia  . Hypertension - labile   . Renal disorder   . UTI (lower urinary tract infection) 10/10/2012  . Stroke   . CKD (chronic kidney disease), stage IV   . Speech impediment   . Dyslipidemia   . Non-ischemic cardiomyopathy     Echo  09/2012:EF <20% with severe global hypokinesis (down from ~45% in 2012), Aortic Sclerosis.  Trivial pericardial effusion; Pt declined furhter investigation, Myoview if + would lead to cath & she does not want cath.  Alcide Clever     Declines PPM   Prior Cardiac Evaluation and Past Surgical History: Past Surgical History  Procedure Laterality Date  . Abdominal surgery      midline abdominal scar   . Knee surgery      both knees    No Known Allergies  Current Outpatient Prescriptions  Medication Sig Dispense Refill  . aspirin EC 81 MG tablet Take 81 mg by mouth daily.      Marland Kitchen atorvastatin (LIPITOR) 40 MG tablet Take 1 tablet (40 mg total) by mouth at bedtime.  30 tablet  5  . cinacalcet (SENSIPAR) 60 MG tablet Take 60 mg by mouth daily.        . furosemide (LASIX) 20 MG tablet Take 1 tablet (20 mg total) by mouth 2 (two) times daily.  60 tablet  3  . metoprolol tartrate (LOPRESSOR) 25 MG tablet Take 25 mg by mouth 2 (two) times daily.       . nitroGLYCERIN (NITROSTAT) 0.4 MG SL tablet Place 0.4 mg under the tongue every 5 (five) minutes as needed for chest pain.      . traMADol (ULTRAM) 50 MG tablet Take 50 mg by mouth 2 (two) times daily.      . vitamin D, CHOLECALCIFEROL, 400 UNITS tablet Take 2 tablets (800 Units total) by mouth daily.  60 tablet  1   No current facility-administered medications for this visit.    History   Social History Narrative   She is a widow. Lives alone. Has home health aide M-F   She had 2 children, her daughter is deceased. She had a grandchild   who is deceased as well. She quit smoking 30-40 years ago. She does not drink alcohol. She does not exercise.   She really does not have any close family nearby. She gets about with public transportation. Has a friend from church who checks in on her.   ROS: A comprehensive Review of Systems - Negative except Symptoms noted above.  She denies any headaches or blurred vision.  No scotoma.  Mild  arthritis pains.  PHYSICAL EXAM BP 170/74  Ht 5' (1.524 m)  Wt 130 lb (58.968 kg)  BMI 25.39 kg/m2 General: She is a pleasant elderly woman. NAD, A&Ox3, Almost unintelligible speech.  HEENT: NCAT, EOMI, MMM, and a protuberant jaw with a thick tongue and a speech impediment. Anicteric sclerae.  Neck: Supple; no LAN, JVD, or carotid bruit. The kyphosis makes it difficult to really tell if there is JVD.  Lungs: CTAB,nonlabored, normal effort, good air movement.  Heart: RRR, soft  S4 gallop, normal S1, S2.  soft 1-2/6 crescendo- decrescendo SEM at RUSB.  No murmur Abdomen: Soft/NT/ND/NABS. No HSM. Extremities: No C/C with 1+ pitting bilaterally.  [id number] today: Yes Rate:69 , Rhythm: NSR; LVH, LAA, NSST-changes.  No significant Change besides NSR vs. Accelerated junctional.  Recent Labs:  none since her discharge.  ASSESSMENT / PLAN: Non-ischemic cardiomyopathy Her most recent echo Doppler showed an EF of less than 20%.  She really does not have any heart failure symptoms besides the edema.  Edema from left-sided heart failure would also correlate with PND and orthopnea, which she does not have.  Edema also predates the dramatic drop in cardiac function.  I wouldn't be apprised that if we check another echo her EF was better.  I think that drop is a transient issue. Regardless the last evaluation of her coronaries was in the catheterization in 2005 and a nuclear stress test in 2009 both of which did not show any evidence to suggest an ischemic complement.  Clearly she may have microvascular ischemia but her LVH from long-standing hypertension is probably the main cause for reduced ejection fraction.  As best I can tell, her only partly medications is to discharge home was low-dose of Lopressor (not notable succinate or carvedilol) and a much lower dose of Lasix for which she takes twice a day.  She was previously on Imdur and hydralazine for afterload reduction, since no ACE inhibitor or ARB  is being used for her renal function.    Plan: Restart hydralazine 25 mg twice a day.  Would then probably need to add back the Imdur as well versus Isordil.  I am curious how her blood pressure does on this alone.    I would like to reevaluate her ejection fraction, but she's not really changed and other studies.    HYPERTENSION, BENIGN SYSTEMIC - Labile In the past her blood pressures been very labile and difficult control.  She's been on some of her medicines.  I believe think that if she truly has an EF of 20% she cannot have a blood pressure 170/74 and still be relatively asymptomatic.  She definitely needs better control, but has had problems with drops in blood pressure.  However the last time was when she was in sepsis and septic shock.  Plan: Restart hydralazine as noted above  Tachycardia-bradycardia - refuses PPM/ICD I had restarted low-dose beta blocker, saw her back in February because she was having a lot of palpitations.  As far as I can tell those have improved.  I am however reluctant to increase the metoprolol at all.  For her blood pressure, and cardiomyopathy, I would prefer to use carvedilol.  I'm just worried that changing medications around too much for her will lead to confusion.  She has declined pacemaker on 2 occasions now.  When her EF is low this would be an ICD and not distant regular pacemaker.  She declined that as well.  In that vein she is also not indicated that she is DNR/DNI.  Bilateral leg edema Well-controlled today.  She is reluctant to take her Lasix issue to be at about, some in breast that actually isn't that bad today.  She'll no low-dose of Lasix, again making it hard to believe her EF is 20%.  Plan continue current dose Lasix.  We'll also defer to Dr. Briant Cedar for further adjustment if felt to indicated.  She hasn't taken the regimen that would be to be on since I have known her.  Cardiorenal syndrome with renal failure With a reduced cardiac  function, ensure that some of her chronic kidney disease probably related to cardiorenal syndrome.  This really only seems to be notable when she is in extremus.  Otherwise things seem to be relatively stable.   Orders Placed This Encounter  Procedures  . EKG 12-Lead   Meds ordered this encounter  Medications  . hydrALAZINE (APRESOLINE) 25 MG tablet    Sig: Take 1 tablet (25 mg total) by mouth 2 (two) times daily.    Dispense:  60 tablet    Refill:  12   Followup: 12 months  Nikitta Sobiech W. Herbie Baltimore, M.D., M.S. [facility] [address] [place], [place]  [address]  [phone] Pager # [phone]

## 2013-05-21 NOTE — Assessment & Plan Note (Signed)
In the past her blood pressures been very labile and difficult control.  She's been on some of her medicines.  I believe think that if she truly has an EF of 20% she cannot have a blood pressure 170/74 and still be relatively asymptomatic.  She definitely needs better control, but has had problems with drops in blood pressure.  However the last time was when she was in sepsis and septic shock.  Plan: Restart hydralazine as noted above

## 2013-06-20 ENCOUNTER — Encounter: Payer: Self-pay | Admitting: Family Medicine

## 2013-06-20 ENCOUNTER — Ambulatory Visit (INDEPENDENT_AMBULATORY_CARE_PROVIDER_SITE_OTHER): Payer: Medicare Other | Admitting: Family Medicine

## 2013-06-20 VITALS — BP 172/68 | HR 82 | Temp 97.9°F | Ht 60.0 in | Wt 129.3 lb

## 2013-06-20 DIAGNOSIS — R42 Dizziness and giddiness: Secondary | ICD-10-CM

## 2013-06-20 LAB — POCT URINALYSIS DIPSTICK
Bilirubin, UA: NEGATIVE
Blood, UA: NEGATIVE
Glucose, UA: NEGATIVE
Ketones, UA: NEGATIVE
Nitrite, UA: NEGATIVE
pH, UA: 5.5

## 2013-06-20 LAB — POCT UA - MICROSCOPIC ONLY

## 2013-06-20 NOTE — Patient Instructions (Signed)

## 2013-06-20 NOTE — Progress Notes (Addendum)
Subjective:     Patient ID: Frances [name], female   DOB: Aug 06, 1929, 79 y.o.   MRN: [id number]  HPI 79 y.o. F presents for evaluation of dizziness when walking for the last week or two. Pt reports decreased appetite and fluid. Pt reports that she is having leakage of urine that is at baseline. Pt has also not been taking her blood pressure. Pt does not feel like she is going to pass out.  Pt has some communication issues and has an aide help with her medications that are delivered. She is currently out of hydralazine and is unable to report last time she took it.   Review of Systems No f/c, CP/SOB, n/v, No Diarrhea, no change in urinary patterns.     Objective:   Physical Exam Filed Vitals:   06/20/13 0853  BP: 172/68  Pulse: 82  Temp: 97.9 F (36.6 C)    NAD, slurrs words normal per clinic nurse who has seen her mutliple times. No obvious CN deficits. Bradycardic, mild systolic murmur no JVD CTAB no w/r/c Abdomen NTTP ND, NO NO c/c/e on lower extremities Able to ambulate in room without assistance. Has shuffling gait but steady. Rose from chair using two arm assistance.     Assessment:     79 y.o. F presents for evaluation of dizziness     Plan:     #dizziness: no findings on PE for dizziness. Likely multifactorial. Pt currently off her blood pressure medication and may be causing symptoms. Pt leaking urine at baseline, will evaluate with UA for possible UTI.  All of these are likely exacerbating her fragile homeostasis given multiple cardiac medical problems, and declining intervention over the past. Will take a conservative approach and if no improvement in 2 weeks, will consider labs. - Pt also with decreased appetite and PO intake. Encouraged pt to take in more food and drink throughout the day. - Restart BP medication (Hydralazine - may consider switching to coreg instead of lopressor and hydralazine to help control BP and heart disease with less rebound). - Check UA for  infection. Will treat if possible infection given concern for dizziness being a symptom of UTI. -- Based on UA neg nitrite and occ epithelial cells, will wait for culture beofre tx.    Frances Scale, MD OB Fellow   Results for Frances [name] (MRN [id number]) as of 06/20/2013 12:12  Ref. Range 06/20/2013 09:37  Color, UA No range found YELLOW  Specific Gravity, UA No range found 1.015  pH, UA No range found 5.5  Glucose Latest Range: NEGATIVE mg/dL NEG  Bilirubin, UA No range found NEG  Ketones, UA No range found NEG  Clarity, UA No range found SLIGHTLY CLOUDY  Protein, UA No range found 100  Urobilinogen, UA Latest Range: 0.0-1.0 mg/dL 0.2  Nitrite, UA No range found NEG  Leukocytes, UA No range found moderate (2+)  RBC, UA No range found NEG  Epithelial cells, urine per micros No range found OCC  WBC, Ur, HPF, POC No range found LOADED  Bacteria, U Microscopic No range found 3+  RBC, urine, microscopic No range found RARE    +UTI - e coli pan sensitive. Will tx with cipro 500mg  BID for 7 days. Message sent to nurse to call pt in AM.  Frances Scale, MD [facility] Fellow

## 2013-06-25 MED ORDER — CIPROFLOXACIN HCL 500 MG PO TABS: 500.0000 mg | ORAL_TABLET | Freq: Two times a day (BID) | ORAL | Status: AC

## 2013-06-25 NOTE — Addendum Note (Signed)
Addended by: Minta Balsam on: 06/25/2013 09:44 PM   Modules accepted: Orders

## 2013-06-26 ENCOUNTER — Other Ambulatory Visit: Payer: Self-pay | Admitting: Family Medicine

## 2013-06-26 ENCOUNTER — Telehealth: Payer: Self-pay | Admitting: *Deleted

## 2013-06-26 NOTE — Telephone Encounter (Signed)
Pt is aware.  Frances [name],CMA  

## 2013-06-26 NOTE — Telephone Encounter (Signed)
Message copied by Henri Medal on Mon Jun 26, 2013  9:23 AM ------      Message from: Jolyn Lent R      Created: Sun Jun 25, 2013  9:39 PM       Please call and inform pt that she has a urinary tract infection and it maybe contributing to her dizziness. Rx for ciprofloxacin 500mg  BID for 7days.            Cheers,      Dr. Ike Bene ------

## 2013-07-10 ENCOUNTER — Other Ambulatory Visit: Payer: Self-pay | Admitting: Family Medicine

## 2013-07-24 ENCOUNTER — Ambulatory Visit (INDEPENDENT_AMBULATORY_CARE_PROVIDER_SITE_OTHER): Payer: Medicare Other | Admitting: Family Medicine

## 2013-07-24 ENCOUNTER — Encounter: Payer: Self-pay | Admitting: Family Medicine

## 2013-07-24 VITALS — BP 127/72 | HR 73 | Temp 97.6°F | Wt 126.0 lb

## 2013-07-24 DIAGNOSIS — I10 Essential (primary) hypertension: Secondary | ICD-10-CM

## 2013-07-24 DIAGNOSIS — Z23 Encounter for immunization: Secondary | ICD-10-CM

## 2013-07-24 NOTE — Assessment & Plan Note (Signed)
Hard to control.  50 point systolic drop from lying to sitting with dizziness.  Will continue current regimen.

## 2013-07-24 NOTE — Patient Instructions (Signed)
Good to see you today!  Thanks for coming in.  Your blood pressure is high when you are sitting or lying but low when you stand - I do not think we should change your medications  If your lightheadness gets worse then call us  Ask Drinda Butts to call your insurance about getting the shingle shot  Come back in 3-4 months or if your leg is worse

## 2013-07-24 NOTE — Progress Notes (Signed)
  Subjective:    Patient ID: Frances [name], female    DOB: [birth year], 79 y.o.   MRN: [id number]  HPI  Leg Pain Has mostly at night when not on them.  Does not hurt now or usually when walking.  No soft tissue swelling or edema or trauma or falls.  Tramadol helps some but she has only been taking scheduled twice daily.  No fever or shortness of breath  HYPERTENSION Disease Monitoring Home BP Monitoring not checking Chest pain- no    Dyspnea- no Medications Compliance-  Brings medications taking regularly. Lightheadedness-  When she stands often feels dizzy has not fallen Edema- mild as usual ROS - See HPI  PMH Lab Review   Potassium  Date Value Range Status  01/24/2013 3.5  3.5 - 5.1 mEq/L Final     Sodium  Date Value Range Status  01/24/2013 136  135 - 145 mEq/L Final     Creat  Date Value Range Status  10/17/2012 6.30* 0.50 - 1.10 mg/dL Final     Creatinine, Ser  Date Value Range Status  01/24/2013 3.12* 0.50 - 1.10 mg/dL Final       Review of Symptoms - see HPI  PMH - Smoking status noted.       Review of Systems     Objective:   Physical Exam Alert usual hard to understand speech Heart - Regular rate and rhythm.  No murmurs, gallops or rubs.    Lungs:  Normal respiratory effort, chest expands symmetrically. Lungs are clear to auscultation, no crackles or wheezes.        Assessment & Plan:

## 2013-08-23 LAB — BASIC METABOLIC PANEL
BUN: 53 mg/dL — AB (ref 4–21)
Creatinine: 2.9 mg/dL — AB (ref 0.5–1.1)
Sodium: 144 mmol/L (ref 137–147)

## 2013-08-28 ENCOUNTER — Ambulatory Visit: Payer: Self-pay | Admitting: Podiatry

## 2013-08-30 ENCOUNTER — Encounter: Payer: Self-pay | Admitting: Family Medicine

## 2013-09-11 ENCOUNTER — Encounter: Payer: Self-pay | Admitting: Podiatry

## 2013-09-11 ENCOUNTER — Ambulatory Visit (INDEPENDENT_AMBULATORY_CARE_PROVIDER_SITE_OTHER): Payer: Medicare Other | Admitting: Podiatry

## 2013-09-11 VITALS — BP 164/77 | HR 72 | Resp 18

## 2013-09-11 DIAGNOSIS — B351 Tinea unguium: Secondary | ICD-10-CM

## 2013-09-11 DIAGNOSIS — M79609 Pain in unspecified limb: Secondary | ICD-10-CM

## 2013-09-11 NOTE — Progress Notes (Signed)
° °  Subjective:    Patient ID: Frances [name], female    DOB: [birth year], 79 y.o.   MRN: [id number]  HPI trim my nails Patient presents for ongoing debridement of painful mycotic toenails.   Review of Systems     Objective:   Physical Exam   Hypertrophic, discolored, brittle, toenails x10 with palpable tenderness in all 10 nail plates.     Assessment & Plan:   Assessment: Symptomatic onychomycoses x10  Plan: All 10 toenails debrided back without a bleeding. Reappoint at three-month intervals.

## 2013-10-16 ENCOUNTER — Other Ambulatory Visit: Payer: Self-pay | Admitting: Family Medicine

## 2013-10-23 ENCOUNTER — Encounter (HOSPITAL_COMMUNITY): Payer: Self-pay | Admitting: Emergency Medicine

## 2013-10-23 ENCOUNTER — Emergency Department (HOSPITAL_COMMUNITY)
Admission: EM | Admit: 2013-10-23 | Discharge: 2013-10-23 | Disposition: A | Discharge status: Home / Self Care | Payer: Medicare Other | Attending: Family Medicine | Admitting: Family Medicine

## 2013-10-23 ENCOUNTER — Emergency Department (HOSPITAL_COMMUNITY): Payer: Medicare Other

## 2013-10-23 DIAGNOSIS — I2510 Atherosclerotic heart disease of native coronary artery without angina pectoris: Secondary | ICD-10-CM | POA: Insufficient documentation

## 2013-10-23 DIAGNOSIS — E785 Hyperlipidemia, unspecified: Secondary | ICD-10-CM | POA: Insufficient documentation

## 2013-10-23 DIAGNOSIS — Z87440 Personal history of urinary (tract) infections: Secondary | ICD-10-CM | POA: Insufficient documentation

## 2013-10-23 DIAGNOSIS — IMO0002 Reserved for concepts with insufficient information to code with codable children: Secondary | ICD-10-CM

## 2013-10-23 DIAGNOSIS — I509 Heart failure, unspecified: Secondary | ICD-10-CM | POA: Insufficient documentation

## 2013-10-23 DIAGNOSIS — R627 Adult failure to thrive: Secondary | ICD-10-CM | POA: Insufficient documentation

## 2013-10-23 DIAGNOSIS — I951 Orthostatic hypotension: Secondary | ICD-10-CM | POA: Insufficient documentation

## 2013-10-23 DIAGNOSIS — I129 Hypertensive chronic kidney disease with stage 1 through stage 4 chronic kidney disease, or unspecified chronic kidney disease: Secondary | ICD-10-CM | POA: Insufficient documentation

## 2013-10-23 DIAGNOSIS — R630 Anorexia: Secondary | ICD-10-CM | POA: Insufficient documentation

## 2013-10-23 DIAGNOSIS — Z87891 Personal history of nicotine dependence: Secondary | ICD-10-CM | POA: Insufficient documentation

## 2013-10-23 DIAGNOSIS — Z8673 Personal history of transient ischemic attack (TIA), and cerebral infarction without residual deficits: Secondary | ICD-10-CM | POA: Insufficient documentation

## 2013-10-23 DIAGNOSIS — Z7982 Long term (current) use of aspirin: Secondary | ICD-10-CM | POA: Insufficient documentation

## 2013-10-23 DIAGNOSIS — N184 Chronic kidney disease, stage 4 (severe): Secondary | ICD-10-CM | POA: Insufficient documentation

## 2013-10-23 DIAGNOSIS — Z79899 Other long term (current) drug therapy: Secondary | ICD-10-CM | POA: Insufficient documentation

## 2013-10-23 DIAGNOSIS — N289 Disorder of kidney and ureter, unspecified: Secondary | ICD-10-CM | POA: Insufficient documentation

## 2013-10-23 DIAGNOSIS — R110 Nausea: Secondary | ICD-10-CM | POA: Insufficient documentation

## 2013-10-23 HISTORY — DX: Dizziness and giddiness: R42

## 2013-10-23 HISTORY — DX: Paresthesia of skin: R20.2

## 2013-10-23 HISTORY — DX: Heart failure, unspecified: I50.9

## 2013-10-23 LAB — CBC WITH DIFFERENTIAL/PLATELET
BASOS ABS: 0 10*3/uL (ref 0.0–0.1)
BASOS PCT: 0 % (ref 0–1)
EOS ABS: 0.1 10*3/uL (ref 0.0–0.7)
Eosinophils Relative: 1 % (ref 0–5)
HCT: 36.2 % (ref 36.0–46.0)
Hemoglobin: 11.6 g/dL — ABNORMAL LOW (ref 12.0–15.0)
Lymphocytes Relative: 20 % (ref 12–46)
Lymphs Abs: 0.9 10*3/uL (ref 0.7–4.0)
MCH: 27.5 pg (ref 26.0–34.0)
MCHC: 32 g/dL (ref 30.0–36.0)
MCV: 85.8 fL (ref 78.0–100.0)
Monocytes Absolute: 0.6 10*3/uL (ref 0.1–1.0)
Monocytes Relative: 15 % — ABNORMAL HIGH (ref 3–12)
NEUTROS ABS: 2.7 10*3/uL (ref 1.7–7.7)
NEUTROS PCT: 63 % (ref 43–77)
Platelets: 121 10*3/uL — ABNORMAL LOW (ref 150–400)
RBC: 4.22 MIL/uL (ref 3.87–5.11)
RDW: 13.7 % (ref 11.5–15.5)
WBC: 4.2 10*3/uL (ref 4.0–10.5)

## 2013-10-23 LAB — URINALYSIS W MICROSCOPIC + REFLEX CULTURE
BILIRUBIN URINE: NEGATIVE
GLUCOSE, UA: NEGATIVE mg/dL
HGB URINE DIPSTICK: NEGATIVE
Ketones, ur: NEGATIVE mg/dL
Leukocytes, UA: NEGATIVE
Nitrite: NEGATIVE
PH: 5 (ref 5.0–8.0)
Protein, ur: 30 mg/dL — AB
SPECIFIC GRAVITY, URINE: 1.016 (ref 1.005–1.030)
Urobilinogen, UA: 0.2 mg/dL (ref 0.0–1.0)

## 2013-10-23 LAB — COMPREHENSIVE METABOLIC PANEL
ALBUMIN: 3.1 g/dL — AB (ref 3.5–5.2)
ALK PHOS: 93 U/L (ref 39–117)
ALT: 24 U/L (ref 0–35)
AST: 28 U/L (ref 0–37)
BILIRUBIN TOTAL: 0.8 mg/dL (ref 0.3–1.2)
BUN: 46 mg/dL — ABNORMAL HIGH (ref 6–23)
CHLORIDE: 108 meq/L (ref 96–112)
CO2: 19 mEq/L (ref 19–32)
CREATININE: 2.59 mg/dL — AB (ref 0.50–1.10)
Calcium: 9.8 mg/dL (ref 8.4–10.5)
GFR calc Af Amer: 19 mL/min — ABNORMAL LOW (ref >90)
GFR calc non Af Amer: 16 mL/min — ABNORMAL LOW (ref >90)
Glucose, Bld: 108 mg/dL — ABNORMAL HIGH (ref 70–99)
POTASSIUM: 3.9 meq/L (ref 3.7–5.3)
Sodium: 145 mEq/L (ref 137–147)
TOTAL PROTEIN: 7.6 g/dL (ref 6.0–8.3)

## 2013-10-23 LAB — LIPASE, BLOOD: LIPASE: 77 U/L — AB (ref 11–59)

## 2013-10-23 LAB — CG4 I-STAT (LACTIC ACID): Lactic Acid, Venous: 0.96 mmol/L (ref 0.5–2.2)

## 2013-10-23 LAB — TROPONIN I

## 2013-10-23 MED ORDER — HYDRALAZINE HCL 25 MG PO TABS
25.0000 mg | ORAL_TABLET | Freq: Once | ORAL | Status: AC
  Administered 2013-10-23: 25 mg via ORAL
  Filled 2013-10-23: qty 1

## 2013-10-23 MED ORDER — SODIUM CHLORIDE 0.9 % IV SOLN
INTRAVENOUS | Status: DC
  Administered 2013-10-23: 10:00:00 via INTRAVENOUS

## 2013-10-23 MED ORDER — CEPHALEXIN 500 MG PO CAPS: 500.0000 mg | ORAL_CAPSULE | Freq: Three times a day (TID) | ORAL | Status: DC

## 2013-10-23 MED ORDER — DEXTROSE 5 % IV SOLN
1.0000 g | Freq: Once | INTRAVENOUS | Status: AC
  Administered 2013-10-23: 1 g via INTRAVENOUS
  Filled 2013-10-23: qty 10

## 2013-10-23 MED ORDER — METOPROLOL TARTRATE 25 MG PO TABS
25.0000 mg | ORAL_TABLET | Freq: Once | ORAL | Status: AC
  Administered 2013-10-23: 25 mg via ORAL
  Filled 2013-10-23: qty 1

## 2013-10-23 NOTE — ED Notes (Signed)
Pt with caregiver. Reports that when caregiver came to patient's home, pt had diarrhea all in her bed. Reports she had not eaten anything since Friday. Pt lives alone. Pt speech is slurred but reports this is not new. Pt denies any pain. Following commands. No NAD

## 2013-10-23 NOTE — Discharge Instructions (Signed)
We believe you may have a urinary tract infection as the cause of you peeing more frequently over the weekend. This possibly could have affected your appetite as well. We want you to take antibiotics for 6 days and see [place] back in clinic as noted on follow up page. Please follow up sooner if you develop fevers, vomiting, abdominal pain, or if you continue to have issues with eating (hoping this improves on antibiotics).

## 2013-10-23 NOTE — ED Notes (Signed)
Pt requesting to not be admitted. Per family, she has been failing to thrive for several years and pt would be more comfortable going home.

## 2013-10-23 NOTE — ED Provider Notes (Signed)
CSN: [id number]     Arrival date & time 10/23/13  0808 History   First MD Initiated Contact with Patient 10/23/13 [phone]     Chief Complaint  Patient presents with  . Weakness    HPI Pt was seen at 0840. Per pt and her caregiver, c/o gradual onset and persistence of constant generalized weakness/fatigue for the past 3 days. Has been associated with poor PO intake and nausea. [name] caregiver states she last saw her Friday morning. When she went to check in on pt this morning, pt was in bed and had soiled herself. Pt told caregiver she "hasn't eaten since Friday." Endorses she "got out of bed to go to the bathroom" a few times, but then went back to bed. Denies CP/palpitations, no SOB/cough, no abd pain, no vomiting, no back pain, no fevers, no focal motor weakness, no tingling/numbness in extremities.     Past Medical History  Diagnosis Date  . Coronary artery disease, non-occlusive 2005    Last cath in 2005 - no significant disease; Myoview 2009 no ischemia  . Hypertension - labile   . Renal disorder   . UTI (lower urinary tract infection) 10/10/2012  . Stroke   . CKD (chronic kidney disease), stage IV   . Speech impediment   . Dyslipidemia   . Non-ischemic cardiomyopathy     Echo 09/2012:EF <20% with severe global hypokinesis (down from ~45% in 2012), Aortic Sclerosis.  Trivial pericardial effusion; Pt declined furhter investigation, Myoview if + would lead to cath & she does not want cath.  . Tachycardia-bradycardia     Declines PPM  . Paresthesia     fingers  . Dizziness   . CHF (congestive heart failure)    Past Surgical History  Procedure Laterality Date  . Abdominal surgery      midline abdominal scar   . Knee surgery      both knees     History  Substance Use Topics  . Smoking status: Former Smoker    Types: Cigarettes    Start date: 05/16/1993  . Smokeless tobacco: Never Used  . Alcohol Use: No    Review of Systems ROS: Statement: All systems negative except as  marked or noted in the HPI; Constitutional: Negative for fever and chills. +generalized weakness/fatigue.; ; Eyes: Negative for eye pain, redness and discharge. ; ; ENMT: Negative for ear pain, hoarseness, nasal congestion, sinus pressure and sore throat. ; ; Cardiovascular: Negative for chest pain, palpitations, diaphoresis, dyspnea and peripheral edema. ; ; Respiratory: Negative for cough, wheezing and stridor. ; ; Gastrointestinal: +nausea, diarrhea, poor PO intake. Negative for vomiting, abdominal pain, blood in stool, hematemesis, jaundice and rectal bleeding. . ; ; Genitourinary: Negative for dysuria, flank pain and hematuria. ; ; Musculoskeletal: Negative for back pain and neck pain. Negative for swelling and trauma.; ; Skin: Negative for pruritus, rash, abrasions, blisters, bruising and skin lesion.; ; Neuro: Negative for headache, lightheadedness and neck stiffness. Negative for altered level of consciousness , altered mental status, extremity weakness, paresthesias, involuntary movement, seizure and syncope.       Allergies  Review of patient's allergies indicates no known allergies.  Home Medications   Current Outpatient Rx  Name  Route  Sig  Dispense  Refill  . aspirin EC 81 MG tablet   Oral   Take 81 mg by mouth daily.         Marland Kitchen atorvastatin (LIPITOR) 40 MG tablet   Oral   Take 40 mg by  mouth daily.         . cinacalcet (SENSIPAR) 60 MG tablet   Oral   Take 60 mg by mouth daily.           . furosemide (LASIX) 20 MG tablet   Oral   Take 20 mg by mouth 2 (two) times daily.         . hydrALAZINE (APRESOLINE) 25 MG tablet   Oral   Take 1 tablet (25 mg total) by mouth 2 (two) times daily.   60 tablet   12   . metoprolol tartrate (LOPRESSOR) 25 MG tablet   Oral   Take 25 mg by mouth 2 (two) times daily.          . nitroGLYCERIN (NITROSTAT) 0.4 MG SL tablet   Sublingual   Place 0.4 mg under the tongue every 5 (five) minutes as needed for chest pain.          . traMADol (ULTRAM) 50 MG tablet   Oral   Take 50 mg by mouth 2 (two) times daily as needed for moderate pain.          . vitamin D, CHOLECALCIFEROL, 400 UNITS tablet   Oral   Take 2 tablets (800 Units total) by mouth daily.   60 tablet   1    BP 166/73  Pulse 91  Temp(Src) 98 F (36.7 C) (Oral)  Resp 21  Ht 5' (1.524 m)  Wt 126 lb (57.153 kg)  BMI 24.61 kg/m2  SpO2 99% Physical Exam 0845; Physical examination:  Nursing notes reviewed; Vital signs and O2 SAT reviewed;  Constitutional: Thin, frail. In no acute distress; Head:  Normocephalic, atraumatic; Eyes: EOMI, PERRL, No scleral icterus; ENMT: Mouth and pharynx normal, Mucous membranes dry; Neck: Supple, Full range of motion, No lymphadenopathy; Cardiovascular: Regular rate and rhythm, No gallop; Respiratory: Breath sounds clear & equal bilaterally, No wheezes.  Speaking full sentences with ease, Normal respiratory effort/excursion; Chest: Nontender, Movement normal; Abdomen: Soft, Nontender, Nondistended, Normal bowel sounds; Genitourinary: No CVA tenderness; Extremities: Pulses normal, No tenderness, No edema, No calf edema or asymmetry.; Neuro: AA&Ox3, Major CN grossly intact. No facial droop. Speech slurred per baseline. No gross focal motor or sensory deficits in extremities.; Skin: Color normal, Warm, Dry.   ED Course  Procedures    EKG Interpretation    Date/Time:  Monday October 23 2013 08:25:14 EST Ventricular Rate:  79 PR Interval:  144 QRS Duration: 104 QT Interval:  399 QTC Calculation: 457 R Axis:   66 Text Interpretation:  Sinus rhythm Ventricular premature complex Non-specific intra-ventricular conduction delay Borderline T abnormalities, inferior leads T wave abnormality Lateral leads Baseline wander in lead(s) V5 V6 When compared with ECG of 01/22/2013 T wave inversion Anterior leads are no longer Present T wave inversion Inferior leads are no longer Present Confirmed by [facility] Center[facility]CCMANUS  MD, Nicholos JohnsKATHLEEN [phone]) on  10/23/2013 8:51:54 AM            MDM  MDM Reviewed: previous chart, nursing note and vitals Reviewed previous: labs and ECG Interpretation: labs, ECG and x-ray     Results for orders placed during the hospital encounter of 10/23/13  URINALYSIS W MICROSCOPIC + REFLEX CULTURE      Result Value Range   Color, Urine YELLOW  YELLOW   APPearance CLEAR  CLEAR   Specific Gravity, Urine 1.016  1.005 - 1.030   pH 5.0  5.0 - 8.0   Glucose, UA NEGATIVE  NEGATIVE mg/dL   Hgb  urine dipstick NEGATIVE  NEGATIVE   Bilirubin Urine NEGATIVE  NEGATIVE   Ketones, ur NEGATIVE  NEGATIVE mg/dL   Protein, ur 30 (*) NEGATIVE mg/dL   Urobilinogen, UA 0.2  0.0 - 1.0 mg/dL   Nitrite NEGATIVE  NEGATIVE   Leukocytes, UA NEGATIVE  NEGATIVE   WBC, UA 0-2  <3 WBC/hpf   RBC / HPF 0-2  <3 RBC/hpf   Bacteria, UA FEW (*) RARE   Squamous Epithelial / LPF RARE  RARE   Casts HYALINE CASTS (*) NEGATIVE   Urine-Other LESS THAN 10 mL OF URINE SUBMITTED    CBC WITH DIFFERENTIAL      Result Value Range   WBC 4.2  4.0 - 10.5 K/uL   RBC 4.22  3.87 - 5.11 MIL/uL   Hemoglobin 11.6 (*) 12.0 - 15.0 g/dL   HCT 68.6  16.8 - 37.2 %   MCV 85.8  78.0 - 100.0 fL   MCH 27.5  26.0 - 34.0 pg   MCHC 32.0  30.0 - 36.0 g/dL   RDW 90.2  11.1 - 55.2 %   Platelets 121 (*) 150 - 400 K/uL   Neutrophils Relative % 63  43 - 77 %   Neutro Abs 2.7  1.7 - 7.7 K/uL   Lymphocytes Relative 20  12 - 46 %   Lymphs Abs 0.9  0.7 - 4.0 K/uL   Monocytes Relative 15 (*) 3 - 12 %   Monocytes Absolute 0.6  0.1 - 1.0 K/uL   Eosinophils Relative 1  0 - 5 %   Eosinophils Absolute 0.1  0.0 - 0.7 K/uL   Basophils Relative 0  0 - 1 %   Basophils Absolute 0.0  0.0 - 0.1 K/uL  COMPREHENSIVE METABOLIC PANEL      Result Value Range   Sodium 145  137 - 147 mEq/L   Potassium 3.9  3.7 - 5.3 mEq/L   Chloride 108  96 - 112 mEq/L   CO2 19  19 - 32 mEq/L   Glucose, Bld 108 (*) 70 - 99 mg/dL   BUN 46 (*) 6 - 23 mg/dL   Creatinine, Ser 0.80 (*) 0.50 -  1.10 mg/dL   Calcium 9.8  8.4 - 22.3 mg/dL   Total Protein 7.6  6.0 - 8.3 g/dL   Albumin 3.1 (*) 3.5 - 5.2 g/dL   AST 28  0 - 37 U/L   ALT 24  0 - 35 U/L   Alkaline Phosphatase 93  39 - 117 U/L   Total Bilirubin 0.8  0.3 - 1.2 mg/dL   GFR calc non Af Amer 16 (*) >90 mL/min   GFR calc Af Amer 19 (*) >90 mL/min  LIPASE, BLOOD      Result Value Range   Lipase 77 (*) 11 - 59 U/L  TROPONIN I      Result Value Range   Troponin I <0.30  <0.30 ng/mL  CG4 I-STAT (LACTIC ACID)      Result Value Range   Lactic Acid, Venous 0.96  0.5 - 2.2 mmol/L   Dg Chest 2 View 10/23/2013   CLINICAL DATA:  Weakness, shortness of breath  EXAM: CHEST  2 VIEW  COMPARISON:  01/22/2013  FINDINGS: Chronic interstitial markings/emphysematous changes. No focal consolidation. No pleural effusion or pneumothorax.  The heart is top normal in size.  Mild degenerative changes of the visualized thoracolumbar spine.  IMPRESSION: No evidence of acute cardiopulmonary disease.   Electronically Signed   By: Charline Bills  M.D.   On: 10/23/2013 09:32   Results for MAYELA, BULLARD (MRN [id number]) as of 10/23/2013 11:06  Ref. Range 01/22/2013 12:20 01/22/2013 23:40 01/24/2013 09:15 08/23/2013 09:00 10/23/2013 08:45  BUN Latest Range: 4-21 mg/dL 48 (H) 39 (H) 35 (H) 53 (A) 46 (H)  Creatinine Latest Range: 0.50-1.10 mg/dL 7.82 (H) 9.56 (H) 2.13 (H) 2.9 (A) 2.59 (H)    1055:  Orthostatic on VS, c/o lightheadedness and unable to stand without 2 assist.  Will continue judicious IVF due to hx cardiomyopathy/CHF. Dx and testing d/w pt and family.  Questions answered.  Verb understanding, agreeable to admit.  T/C to [facility] Resident, case discussed, including:  HPI, pertinent PM/SHx, VS/PE, dx testing, ED course and treatment:  Agreeable to admit, requests they will come to the ED for eval.   1500:  [facility] service has evaluated pt in the ED: they will be discharging pt; states she was able to walk by herself without any assist or orthostasis for them  during their evaluation, and pt does not want to be admitted; states caregiver will pick her up from the ED after dose of IV rocephin, rx keflex for UTI, they will have her f/u in their ofc.   Laray Anger, DO 10/25/13 1734

## 2013-10-23 NOTE — Consult Note (Signed)
Family Medicine Teaching Service ED Consult for ED discharge Service Pager: [phone]  Patient name: Frances [name] A [facility] Medical record number: [id number] Date of birth: 1929/01/31 Age: 80 y.o. Gender: female  Primary Care Provider: Carney LivingHAMBLISS,MARSHALL L, MD  Chief Complaint: decreased appetite, urinating in the bed  Assessment and Plan: Frances [name] is a 80 y.o. female presenting with decreased appetite . PMH is significant for non-ischemic cardiomyopathy with EF <20%, CKD Stage IV, labile blood pressure.   Decreased appetite Patient states her appetite is better after a few hours of IVF. She would like to eat something and she would like to go home. I have asked nursing staff to provide some soup per patient request. She states she does not want to come into the hospital. I spoke with caregiver who states brought to ED to make sure something wasn't majorly wrong with her given decreased intake but that she thought patient was at about her baseline of functionality at home (just hadn't eaten over weekend). Patient was mildly orthostatic on arrival to ED with drop in systolic of 24 and HR elevated by 15. She was rehydrated with 75 ml/hr NS for several hours. After this IVF, patient was able to stand without assist and was not lightheaded or dizzy. Labwork reviewed and no clear indication for admission. Patient does have few bacteria on UA and reports polyuria over the weekend as well as incontinence this morning which is atypical for her. Will treat with CTX x 1 today and 6 more days of Keflex. Sent for Urine Culture, possible this is not UTI, but would still treat or course. Caregiver is to come pick patient up at 3pm today. She will be discharged by EDP at that time. Discussed care plan with PCP by Dr. Deirdre Priesthambliss.   Regarding differential: CV-Patient denies shortness of breath or chest pain. CXR did not show increased interstitial edema. Troponin negative. PVC on EKG.  Dehydration-believe this is mild  (slightly dry lips but BUN about at baseline). Improved with IVF in ED.  ID-did have diarrhea x 1 this morning but none otherwise, may have contributed to dehydration but will need to follow at home as may contribute to dehydration.  History cholecystitis-lipase mildly elevated, possibly passed a stone, appears to have been elevated in the past. Blood pressure elevated-missed AM doses of medicines, will take when she gets home. Labile in the past. No adjustments at this time.  Patient does not have family support in the area but caregiver will assist as able.   Disposition: Discharge by EDP at 3 pm under care of aide [phone]), will be treated with cefriaxone. Follow up with us in 1 week (already scheduled)   History of Present Illness: [place] A Benay [name] is a 80 y.o. female presenting with decreased appetite over the weekend.   Patient states that she had not had a desire to eat over the weekend. She endorses increased urination over the weekend with this. Patient states she simply didn't want to eat. She denies nausea/vomiting. Had food at home to eat. Was able to get up and down without difficulty over the weekend but was a little weaker this morning. Caregiver/aide states patient ate some last week but always doesn't eat much. Not eating at all is atypical for patient.   Review Of Systems: Per HPI with the following additions: No chest pain, palpitations, shortness of breath, cough, abdominal pain, fevers, cva tenderness, chills.   Otherwise 12 point review of systems was performed and was unremarkable.  Patient Active  Problem List   Diagnosis Date Noted  . Bilateral leg edema 05/21/2013    Class: Chronic  . Non-ischemic cardiomyopathy   . Tachycardia-bradycardia - refuses PPM/ICD 10/21/2012  . Cardiorenal syndrome with renal failure 10/12/2012  . Sinus pause, refuses pacemaker/ICD 10/12/2012  . At high risk for falls 11/30/2011  . Cholecystitis 04/29/2011  . Health care home, active care  coordination 04/08/2011  . HYPERTENSION, BENIGN SYSTEMIC - Labile 11/25/2006  . EDEMA-LEGS,DUE TO VENOUS OBSTRUCT. 11/25/2006  . CHRONIC KIDNEY DISEASE STAGE IV (SEVERE) 11/25/2006  . DJD (degenerative joint disease) 11/25/2006  . Coronary artery disease, non-occlusive 05/21/2004   Past Medical History: Past Medical History  Diagnosis Date  . Coronary artery disease, non-occlusive 2005    Last cath in 2005 - no significant disease; Myoview 2009 no ischemia  . Hypertension - labile   . Renal disorder   . UTI (lower urinary tract infection) 10/10/2012  . Stroke   . CKD (chronic kidney disease), stage IV   . Speech impediment   . Dyslipidemia   . Non-ischemic cardiomyopathy     Echo 09/2012:EF <20% with severe global hypokinesis (down from ~45% in 2012), Aortic Sclerosis.  Trivial pericardial effusion; Pt declined furhter investigation, Myoview if + would lead to cath & she does not want cath.  . Tachycardia-bradycardia     Declines PPM  . Paresthesia     fingers  . Dizziness   . CHF (congestive heart failure)    Past Surgical History: Past Surgical History  Procedure Laterality Date  . Abdominal surgery      midline abdominal scar   . Knee surgery      both knees    Social History: History  Substance Use Topics  . Smoking status: Former Smoker    Types: Cigarettes    Start date: 05/16/1993  . Smokeless tobacco: Never Used  . Alcohol Use: No   Additional social history: lives alone, caregiver with her 2 hours a day during week but is available on weekend for help [phone] [facility])  Please also refer to relevant sections of EMR.  Family History: No family history on file. Allergies and Medications: No Known Allergies No current facility-administered medications on file prior to encounter.   Current Outpatient Prescriptions on File Prior to Encounter  Medication Sig Dispense Refill  . aspirin EC 81 MG tablet Take 81 mg by mouth daily.      . cinacalcet (SENSIPAR)  60 MG tablet Take 60 mg by mouth daily.        . hydrALAZINE (APRESOLINE) 25 MG tablet Take 1 tablet (25 mg total) by mouth 2 (two) times daily.  60 tablet  12  . metoprolol tartrate (LOPRESSOR) 25 MG tablet Take 25 mg by mouth 2 (two) times daily.       . nitroGLYCERIN (NITROSTAT) 0.4 MG SL tablet Place 0.4 mg under the tongue every 5 (five) minutes as needed for chest pain.      . traMADol (ULTRAM) 50 MG tablet Take 50 mg by mouth 2 (two) times daily as needed for moderate pain.       . vitamin D, CHOLECALCIFEROL, 400 UNITS tablet Take 2 tablets (800 Units total) by mouth daily.  60 tablet  1    Objective: BP 211/79  Pulse 82  Temp(Src) 98.6 F (37 C) (Oral)  Resp 22  Ht 5' (1.524 m)  Wt 126 lb (57.153 kg)  BMI 24.61 kg/m2  SpO2 98% Gen: elderly thin female, no acute distress HEENT: NCAT,  Mucous membranes are moist with mildly dry lips, PERRLA  CV: regular rate and rhythm, no murmurs, rubs or gallops Lungs: CTAB with exception of mild crackles at bilateral base Abd: soft/nontender/nondistended/normal bowel sounds  MSK: moves all extremities, trace edema Skin: warm and dry, no rash  Neuro: alert and oriented x 4 (name, who I am, in ED and brought by caregiver due to decreased appetite, year/date/month). Per Dr. Deirdre Priest, speech difficult to understand at baseline.   Labs and Imaging:  Results for orders placed during the hospital encounter of 10/23/13 (from the past 24 hour(s))  CBC WITH DIFFERENTIAL     Status: Abnormal   Collection Time    10/23/13  8:45 AM      Result Value Range   WBC 4.2  4.0 - 10.5 K/uL   RBC 4.22  3.87 - 5.11 MIL/uL   Hemoglobin 11.6 (*) 12.0 - 15.0 g/dL   HCT 44.9  75.3 - 00.5 %   MCV 85.8  78.0 - 100.0 fL   MCH 27.5  26.0 - 34.0 pg   MCHC 32.0  30.0 - 36.0 g/dL   RDW 11.0  21.1 - 17.3 %   Platelets 121 (*) 150 - 400 K/uL   Neutrophils Relative % 63  43 - 77 %   Neutro Abs 2.7  1.7 - 7.7 K/uL   Lymphocytes Relative 20  12 - 46 %   Lymphs Abs  0.9  0.7 - 4.0 K/uL   Monocytes Relative 15 (*) 3 - 12 %   Monocytes Absolute 0.6  0.1 - 1.0 K/uL   Eosinophils Relative 1  0 - 5 %   Eosinophils Absolute 0.1  0.0 - 0.7 K/uL   Basophils Relative 0  0 - 1 %   Basophils Absolute 0.0  0.0 - 0.1 K/uL  COMPREHENSIVE METABOLIC PANEL     Status: Abnormal   Collection Time    10/23/13  8:45 AM      Result Value Range   Sodium 145  137 - 147 mEq/L   Potassium 3.9  3.7 - 5.3 mEq/L   Chloride 108  96 - 112 mEq/L   CO2 19  19 - 32 mEq/L   Glucose, Bld 108 (*) 70 - 99 mg/dL   BUN 46 (*) 6 - 23 mg/dL   Creatinine, Ser 5.67 (*) 0.50 - 1.10 mg/dL   Calcium 9.8  8.4 - 01.4 mg/dL   Total Protein 7.6  6.0 - 8.3 g/dL   Albumin 3.1 (*) 3.5 - 5.2 g/dL   AST 28  0 - 37 U/L   ALT 24  0 - 35 U/L   Alkaline Phosphatase 93  39 - 117 U/L   Total Bilirubin 0.8  0.3 - 1.2 mg/dL   GFR calc non Af Amer 16 (*) >90 mL/min   GFR calc Af Amer 19 (*) >90 mL/min  LIPASE, BLOOD     Status: Abnormal   Collection Time    10/23/13  8:45 AM      Result Value Range   Lipase 77 (*) 11 - 59 U/L  TROPONIN I     Status: None   Collection Time    10/23/13  8:45 AM      Result Value Range   Troponin I <0.30  <0.30 ng/mL  CG4 I-STAT (LACTIC ACID)     Status: None   Collection Time    10/23/13  8:51 AM      Result Value Range   Lactic Acid, Venous  0.96  0.5 - 2.2 mmol/L  URINALYSIS W MICROSCOPIC + REFLEX CULTURE     Status: Abnormal   Collection Time    10/23/13  8:57 AM      Result Value Range   Color, Urine YELLOW  YELLOW   APPearance CLEAR  CLEAR   Specific Gravity, Urine 1.016  1.005 - 1.030   pH 5.0  5.0 - 8.0   Glucose, UA NEGATIVE  NEGATIVE mg/dL   Hgb urine dipstick NEGATIVE  NEGATIVE   Bilirubin Urine NEGATIVE  NEGATIVE   Ketones, ur NEGATIVE  NEGATIVE mg/dL   Protein, ur 30 (*) NEGATIVE mg/dL   Urobilinogen, UA 0.2  0.0 - 1.0 mg/dL   Nitrite NEGATIVE  NEGATIVE   Leukocytes, UA NEGATIVE  NEGATIVE   WBC, UA 0-2  <3 WBC/hpf   RBC / HPF 0-2  <3 RBC/hpf    Bacteria, UA FEW (*) RARE   Squamous Epithelial / LPF RARE  RARE   Casts HYALINE CASTS (*) NEGATIVE   Urine-Other LESS THAN 10 mL OF URINE SUBMITTED     Dg Chest 2 View 10/23/2013   CLINICAL DATA:  Weakness, shortness of breath  EXAM: CHEST  2 VIEW  COMPARISON:  01/22/2013  FINDINGS: Chronic interstitial markings/emphysematous changes. No focal consolidation. No pleural effusion or pneumothorax.  The heart is top normal in size.  Mild degenerative changes of the visualized thoracolumbar spine.  IMPRESSION: No evidence of acute cardiopulmonary disease.   Electronically Signed   By: Charline Bills M.D.   On: 10/23/2013 09:32   EKG-sinus rhythm which occasional PVC.   Shelva Majestic, MD 10/23/2013, 12:08 PM PGY-3, [facility] FPTS Intern pager: [phone], text pages welcome

## 2013-10-23 NOTE — ED Notes (Signed)
Patient transported to X-ray 

## 2013-10-23 NOTE — Consult Note (Signed)
I discussed with Dr Durene Cal

## 2013-10-23 NOTE — ED Notes (Signed)
Sherry Ruffing, pts caregiver [phone]

## 2013-10-24 LAB — URINE CULTURE
CULTURE: NO GROWTH
Colony Count: NO GROWTH

## 2013-10-30 ENCOUNTER — Other Ambulatory Visit: Payer: Self-pay | Admitting: Cardiology

## 2013-10-30 NOTE — Telephone Encounter (Signed)
Rx was sent to pharmacy electronically. 

## 2013-11-01 ENCOUNTER — Encounter: Payer: Self-pay | Admitting: Family Medicine

## 2013-11-01 ENCOUNTER — Ambulatory Visit (INDEPENDENT_AMBULATORY_CARE_PROVIDER_SITE_OTHER): Payer: Medicare Other | Admitting: Family Medicine

## 2013-11-01 VITALS — BP 179/69 | HR 98 | Temp 97.5°F | Wt 122.0 lb

## 2013-11-01 DIAGNOSIS — R1013 Epigastric pain: Secondary | ICD-10-CM

## 2013-11-01 MED ORDER — OMEPRAZOLE 20 MG PO CPDR: 20.0000 mg | DELAYED_RELEASE_CAPSULE | Freq: Every day | ORAL | Status: DC

## 2013-11-01 NOTE — Progress Notes (Signed)
   Subjective:    Patient ID: [name], female    DOB: [birth year], 80 y.o.   MRN: [id number]  HPI  ABDOMINAL PAIN  Location: epigastric Onset: for last week or so  Radiation: no Severity: comes and goes  Quality: achy Tramadol helps a little.  Not taking a PPI.  No known bleeding.  No vomiting but does feel nausea   Symptoms Nausea/Vomiting: yes  Diarrhea: no  Constipation: no  Melena/BRBPR: no  Hematemesis: no  Anorexia: yes  Fever/Chills: no  Dysuria: no  Rash: no  Wt loss: yes  EtOH use: no  NSAIDs/ASA: no   PMH Has significant renal failure - not interested in dialysis    Review of Systems     Objective:   Physical Exam  Alert no acute distress Abdomen - mild epigastric tenderness no guarding or rebound or masses MM - moist No pedal edema! Lungs:  Normal respiratory effort, chest expands symmetrically. Lungs are clear to auscultation, no crackles or wheezes. Heart - Regular rate and rhythm.  No murmurs, gallops or rubs.          Assessment & Plan:

## 2013-11-01 NOTE — Patient Instructions (Signed)
Good to see you today!  Thanks for coming in.  Start taking a new medication - Omeprazole 20 mg every morning to help with stomach pain  You can also take tramadol up to 4 x a day for pain  Come back in 2 weeks or sooner if the pain is worsening or having nausea or vomiting

## 2013-11-01 NOTE — Assessment & Plan Note (Signed)
Acute - many possibilities including gall stones (known) or PUD or uremia.  Rcent labs were stable.  No red flags for infection or obstruction.  Will treat with PPI and follow up closely.  She is adamant she wants to live alone and does not want dialysis

## 2013-11-15 ENCOUNTER — Ambulatory Visit: Payer: Medicare Other | Admitting: Family Medicine

## 2013-12-19 ENCOUNTER — Other Ambulatory Visit: Payer: Self-pay | Admitting: *Deleted

## 2013-12-19 MED ORDER — FUROSEMIDE 20 MG PO TABS: 20.0000 mg | ORAL_TABLET | Freq: Two times a day (BID) | ORAL | Status: DC

## 2014-01-17 ENCOUNTER — Ambulatory Visit: Payer: Medicare Other | Admitting: Podiatry

## 2014-01-23 ENCOUNTER — Other Ambulatory Visit: Payer: Self-pay | Admitting: Family Medicine

## 2014-01-23 DIAGNOSIS — N184 Chronic kidney disease, stage 4 (severe): Secondary | ICD-10-CM

## 2014-01-23 DIAGNOSIS — I428 Other cardiomyopathies: Secondary | ICD-10-CM

## 2014-01-23 DIAGNOSIS — I10 Essential (primary) hypertension: Secondary | ICD-10-CM

## 2014-01-29 ENCOUNTER — Ambulatory Visit (INDEPENDENT_AMBULATORY_CARE_PROVIDER_SITE_OTHER): Payer: Medicare Other | Admitting: Podiatry

## 2014-01-29 ENCOUNTER — Encounter: Payer: Self-pay | Admitting: Podiatry

## 2014-01-29 VITALS — BP 184/84 | HR 86 | Resp 18

## 2014-01-29 DIAGNOSIS — B351 Tinea unguium: Secondary | ICD-10-CM

## 2014-01-29 DIAGNOSIS — M79609 Pain in unspecified limb: Secondary | ICD-10-CM

## 2014-01-30 NOTE — Progress Notes (Signed)
Patient ID: Frances [name], female   DOB: Aug 15, 1929, 80 y.o.   MRN: [id number]  Subjective: Patient presents for ongoing debridement of painful mycotic toenails  Objective: Elongated, hypertrophic, discolored toenails x10  Assessment: Symptomatic onychomycoses x10  Plan: Nails x10 are debrided without a bleeding. Reappoint x3 months

## 2014-02-19 ENCOUNTER — Other Ambulatory Visit: Payer: Self-pay | Admitting: Family Medicine

## 2014-02-26 ENCOUNTER — Encounter: Payer: Self-pay | Admitting: Family Medicine

## 2014-02-26 ENCOUNTER — Ambulatory Visit (INDEPENDENT_AMBULATORY_CARE_PROVIDER_SITE_OTHER): Payer: Medicare Other | Admitting: Family Medicine

## 2014-02-26 VITALS — BP 174/70 | HR 82 | Temp 97.5°F | Ht <= 58 in | Wt 122.4 lb

## 2014-02-26 DIAGNOSIS — R600 Localized edema: Secondary | ICD-10-CM

## 2014-02-26 DIAGNOSIS — R609 Edema, unspecified: Secondary | ICD-10-CM

## 2014-02-26 DIAGNOSIS — Z0000 Encounter for general adult medical examination without abnormal findings: Secondary | ICD-10-CM

## 2014-02-26 DIAGNOSIS — M1990 Unspecified osteoarthritis, unspecified site: Secondary | ICD-10-CM

## 2014-02-26 MED ORDER — TRAMADOL HCL 50 MG PO TABS: 50.0000 mg | ORAL_TABLET | Freq: Two times a day (BID) | ORAL | Status: DC | PRN

## 2014-02-26 NOTE — Patient Instructions (Signed)
Good to see you today!  Thanks for coming in.  Take tramadol for pain 1 pill up to twice daily as needed  Call if the pain is not well controlled  Come back in 6 weeks

## 2014-02-26 NOTE — Progress Notes (Signed)
Patient here for annual wellness visit, patient reports: Risk Factors/Conditions needing evaluation or treatment: Pt. does have risk factors that need evaluation.  Home Safety: Pt lives at home, by herself in a one story home.  Pt reports having smoke alarms and having adaptive equipment.  Other Information: Corrective lens: Pt has corrective lens, has annual eye exams. Dentures: Pt has dentures, has does not dental exams. Memory: Pt denies memory problems but mentioned that it is not like it used to be.   Patient's Mini Mental Score (recorded in doc. flowsheet): 26 Bladder:  Pt denies problems with bladder control.  BMI/Exercise: We discussed BMI and strategies for weight maintanence including eating regular meals.   ADL/IADL:  Pt reports independence in her ADLs but reports dependence for IADLs. Balance/Gait: Pt reports 0 falls in the past year.  We discussed home safety and fall prevention.    Pt. Was unable to do walk test but does have a cane.  Annual Wellness Visit Requirements Recorded Today In  Medical, family, social history Past Medical, Family, Social History Section  Current providers Care team  Current medications Medications  Wt, BP, Ht, BMI Vital signs     Hearing assessment (welcome visit) Hearing/vision  Tobacco, alcohol, illicit drug use History  ADL Nurse Assessment  Depression Screening Nurse Assessment  Cognitive impairment Nurse Assessment  Mini Mental Status Document Flowsheet  Fall Risk Fall/Depression  Home Safety Progress Note  End of Life Planning (welcome visit) Social Documentation  Medicare preventative services Progress Note  Risk factors/conditions needing evaluation/treatment Progress Note  Personalized health advice Patient Instructions, goals, letter  Diet & Exercise Social Documentation  Emergency Contact Social Documentation  Seat Belts Social Documentation  Sun exposure/protection Social Documentation    Carney Living

## 2014-02-26 NOTE — Assessment & Plan Note (Signed)
Most likely cause of leg pain.  Refilled her tramadol that she had run out of.   Aim to control pain without causing faill

## 2014-02-26 NOTE — Assessment & Plan Note (Signed)
Stable.  Continue lasix current dose

## 2014-02-26 NOTE — Progress Notes (Signed)
   Subjective:    Patient ID: Frances [name], female    DOB: 1929-06-17, 80 y.o.   MRN: [id number]  HPI  Leg Pain Has occasional leg pain mostly on left around her knee.  Comes and goes.  No swelling or redness.  Does not give out  Edema Well controlled.  No leg sores or lesions  Review of Symptoms - see HPI  PMH - Smoking status noted.     Review of Systems     Objective:   Physical Exam Alert no acute distress Legs - mild edema bilaterally.  Mild tenderness with range of motion of knees.  No effusion         Assessment & Plan:

## 2014-02-28 ENCOUNTER — Encounter: Payer: Self-pay | Admitting: Family Medicine

## 2014-03-01 ENCOUNTER — Other Ambulatory Visit: Payer: Self-pay | Admitting: Family Medicine

## 2014-03-01 ENCOUNTER — Telehealth: Payer: Self-pay | Admitting: *Deleted

## 2014-03-01 NOTE — Telephone Encounter (Signed)
Received fax from [facility] for Tramadol 50 mg tab.  Requested Rx be called in so they can delivery medication to pt.  Pt has printed copy of Rx given by PCP.  Per Dr. Deirdre Priest, call Rx into pharmacy and have them pick up hard copy and mail back to clinic.  Verbal order of Tramadol 50 mg tab: Take 1 tablet (50 mg total) by mouth 2 (two) times daily as needed for moderate pain , #60, 5 refills.  Clovis Pu, RN

## 2014-04-03 ENCOUNTER — Ambulatory Visit (INDEPENDENT_AMBULATORY_CARE_PROVIDER_SITE_OTHER): Payer: Medicare Other | Admitting: Family Medicine

## 2014-04-03 ENCOUNTER — Encounter: Payer: Self-pay | Admitting: Family Medicine

## 2014-04-03 VITALS — BP 186/73 | HR 85 | Temp 98.1°F | Ht <= 58 in | Wt 125.0 lb

## 2014-04-03 DIAGNOSIS — M150 Primary generalized (osteo)arthritis: Principal | ICD-10-CM

## 2014-04-03 DIAGNOSIS — M159 Polyosteoarthritis, unspecified: Secondary | ICD-10-CM

## 2014-04-03 MED ORDER — TRAMADOL HCL 50 MG PO TABS: 50.0000 mg | ORAL_TABLET | Freq: Two times a day (BID) | ORAL | Status: DC | PRN

## 2014-04-03 NOTE — Patient Instructions (Signed)
Good to see you today!  Thanks for coming in.  For the leg pain Take Tylenol two 325 mg tabs up to three times daily Also can take with it Tramadol 1 up to twice daily  Call or come in if the pain is getting worse  Come back in 6 months  Happy Birthday!!

## 2014-04-03 NOTE — Assessment & Plan Note (Signed)
Intermittent pain could be DJD or cramps due to her renal failure although unsual is always in same area.  Continue to urge her to take tylenol and tramadol as needed

## 2014-04-03 NOTE — Progress Notes (Signed)
   Subjective:    Patient ID: Frances [name], female    DOB: 1929-01-02, 80 y.o.   MRN: [id number]  HPI  Leg Pain Continues to have intermittently.  Was very painful in left lower leg and knee when awoke this am but does not have now at all.  No edema or soft tissue swelling or redness or giving away. Taking tramadol sometimes once a day.  Not using tylenol  Chief Complaint noted Review of Symptoms - see HPI PMH - Smoking status noted.   Vital Signs reviewed    Review of Systems     Objective:   Physical Exam  No acute distress Left leg good range of motion of hip and knee without pain.  No muscle tenderness.  No knee effusion or laxity       Assessment & Plan:

## 2014-04-09 ENCOUNTER — Encounter: Payer: Self-pay | Admitting: Clinical

## 2014-04-09 NOTE — Progress Notes (Signed)
CSW received a request for an updated FL2 from [facility]. CSW has placed completed FL2 in PCP box for signature. CSW to fax FL2 to [facility] once FL2 is signed.  Theresia Bough, MSW, LCSW [phone]

## 2014-04-10 ENCOUNTER — Encounter: Payer: Self-pay | Admitting: Clinical

## 2014-04-10 NOTE — Progress Notes (Signed)
FL2 for pt was sent to Jennette Kettle with [facility] on 04/09/14. CSW left a vm with Dendra making her aware of faxed FL2.  Theresia Bough, MSW, LCSW [phone]

## 2014-05-02 ENCOUNTER — Ambulatory Visit (INDEPENDENT_AMBULATORY_CARE_PROVIDER_SITE_OTHER): Payer: Medicare Other | Admitting: Podiatry

## 2014-05-02 ENCOUNTER — Encounter: Payer: Self-pay | Admitting: Podiatry

## 2014-05-02 VITALS — BP 112/86 | HR 74 | Resp 12

## 2014-05-02 DIAGNOSIS — M79673 Pain in unspecified foot: Secondary | ICD-10-CM

## 2014-05-02 DIAGNOSIS — M79609 Pain in unspecified limb: Secondary | ICD-10-CM

## 2014-05-02 DIAGNOSIS — B351 Tinea unguium: Secondary | ICD-10-CM

## 2014-05-03 NOTE — Progress Notes (Signed)
Patient ID: Frances [name], female   DOB: [birth year], 80 y.o.   MRN: [id number]  Subjective: This patient is today complaining of painful toenails when walking wearing shoes  Objective: Hypertrophic, elongated, incurvated toenails 6-10  Assessment: Symptomatic onychomycoses 6-10  Plan: Debrided toenails x10 without any bleeding  Reappoint x3 months

## 2014-05-21 ENCOUNTER — Other Ambulatory Visit: Payer: Self-pay | Admitting: *Deleted

## 2014-05-21 ENCOUNTER — Encounter: Payer: Self-pay | Admitting: Cardiology

## 2014-05-21 ENCOUNTER — Ambulatory Visit (INDEPENDENT_AMBULATORY_CARE_PROVIDER_SITE_OTHER): Payer: Medicare Other | Admitting: Cardiology

## 2014-05-21 ENCOUNTER — Telehealth: Payer: Self-pay | Admitting: *Deleted

## 2014-05-21 VITALS — BP 132/64 | HR 77 | Ht <= 58 in | Wt 125.0 lb

## 2014-05-21 DIAGNOSIS — Z0000 Encounter for general adult medical examination without abnormal findings: Secondary | ICD-10-CM

## 2014-05-21 DIAGNOSIS — I428 Other cardiomyopathies: Secondary | ICD-10-CM

## 2014-05-21 DIAGNOSIS — I495 Sick sinus syndrome: Secondary | ICD-10-CM

## 2014-05-21 DIAGNOSIS — I10 Essential (primary) hypertension: Secondary | ICD-10-CM

## 2014-05-21 DIAGNOSIS — I2510 Atherosclerotic heart disease of native coronary artery without angina pectoris: Secondary | ICD-10-CM

## 2014-05-21 DIAGNOSIS — I871 Compression of vein: Secondary | ICD-10-CM

## 2014-05-21 MED ORDER — VITAMIN D 400 UNITS PO TABS: 800.0000 [IU] | ORAL_TABLET | Freq: Every day | ORAL | Status: DC

## 2014-05-21 NOTE — Patient Instructions (Signed)
PLEASE HAVE YOUR HOME AID TO CALL WITH MEDICATION   Your physician wants you to follow-up in 6 MONTH DR HARDING.  You will receive a reminder letter in the mail two months in advance. If you don't receive a letter, please call our office to schedule the follow-up appointment.

## 2014-05-21 NOTE — Telephone Encounter (Signed)
LEFT MESSAGE TO CALL BACK. NEED TO KNOW THE EXACT MEDICATION THAT THE PATIENT IS TAKING. PATIENT STATES THAT ANTIONETTE FILLS HER MEDICATION BOX WEEKLY.

## 2014-05-21 NOTE — Telephone Encounter (Signed)
RN SPOKE TO ANTIONETTE. SHE STATES SHE WANTED RN TO CALL HER TOMORROW, TO GO OVER PATIENT 'S MEDICATION LIST WHILE SHE IS AT THE PATIENT'S HOME RN AGREED.WILL CALL TOMORROW

## 2014-05-22 NOTE — Assessment & Plan Note (Signed)
No plans for further evaluation. She has not had any anginal symptoms. Is on aspirin and statin as well as beta blocker and nitrate.

## 2014-05-22 NOTE — Assessment & Plan Note (Signed)
Currently has home health aide who spends at least 2 hours per day with her.  DNR/DNI

## 2014-05-22 NOTE — Assessment & Plan Note (Signed)
This is probably the best I've seen her blood pressure unfortunately were not sure what medication she is actually taking. Hopefully she is taking hydralazine and Imdur.  Would not make any additional adjustments besides switching from short-acting to long-acting metoprolol as noted above

## 2014-05-22 NOTE — Assessment & Plan Note (Signed)
Stable, and the best it has been as far as I can tell on current dose of Lasix. Moderate by primary physician and nephrologist as well

## 2014-05-22 NOTE — Assessment & Plan Note (Signed)
Seems to be tolerating low-dose beta blocker. Would not titrate up, but would be okay just convert to a crippling dosing of a long-acting beta blocker.

## 2014-05-22 NOTE — Assessment & Plan Note (Signed)
Last echo showed EF of 20%, however she is not acting as though she has severe heart failure. Her edema is the best it is being. She is not noting any PND, orthopnea. In order to respect her wishes, we are not to pursue any further evaluation of her ejection fraction, we'll simply continue on with her diuretic regimen as monitored by her nephrologist I would like for her to be on hydralazine Imdur combination for afterload reduction. She appears to be on Lopressor and I would prefer her to be on Toprol. Will defer final medication she is truly on and I would prefer to change her prescription to metoprolol succinate/Toprol given her cardiomyopathy.

## 2014-05-22 NOTE — Progress Notes (Addendum)
PCP: Frances Living, MD  Clinic Note: Chief Complaint  Patient presents with  . Annual Exam    no chest pain , no sob , no edema, will see kidney MD IN SEPT 2015    HPI: Frances [name] is a 80 y.o. female with a Cardiovascular Problem List below who presents today for annual followup of hypertension, cardiomyopathy with most recent echo showing EF 20%, previously 45%. She has long-standing lower extremity edema as well as tachybradycardia syndrome. At her last visit, we decided that she was not interested in pursuing repeat evaluation of her EF, nor was she interested in having an ischemic evaluation or pacemaker placement. She essentially insinuated that she would prefer to be DO NOT RESUSCITATE/DO NOT INTUBATE and no plans for invasive procedures. He follows up routinely with her nephrologist, Frances [name] who adjusts her diuretic. She now has a home health aide who assists with her medications, and she is not quite clear on the medications that she is taking as per is not present with her. Therefore we do not know exactly what medications he is taking. She's not sure she's taking hydralazine and Imdur.  Interval History: She presents today doing quite well. Her only major complaint is she says is that she gets heartburn. Her edema has notably improved and minimally existent on her current dose of. She denies any PND, orthopnea or edema. No angina with rest or exertion. She is very difficult to understand speech, and therefore is very difficult to obtain a history. Part of appear that her cardiac review of systems is negative as follows: No palpitations, lightheadedness, dizziness, weakness or syncope/near syncope. No TIA/amaurosis fugax symptoms. No melena, hematochezia, hematuria, or epstaxis. No claudication.   Past Medical History  Diagnosis Date  . Coronary artery disease, non-occlusive 2005    Last cath in 2005 - no significant disease; Myoview 2009 no ischemia  .  Hypertension - labile   . Renal disorder   . UTI (lower urinary tract infection) 10/10/2012  . Stroke   . CKD (chronic kidney disease), stage IV   . Speech impediment   . Dyslipidemia   . Non-ischemic cardiomyopathy     Echo 09/2012:EF <20% with severe global hypokinesis (down from ~45% in 2012), Aortic Sclerosis.  Trivial pericardial effusion; Pt declined furhter investigation, Myoview if + would lead to cath & she does not want cath.  . Tachycardia-bradycardia     Declines PPM  . Paresthesia     fingers  . Dizziness   . CHF (congestive heart failure)     Prior Cardiac Evaluation and Past Surgical History: Procedure Laterality Date  . Renal doppler  05/02/2005    Non-specific medical renal disease with bilateral renal cysts. Otherwise no significant abnormality with no obstructive uropathy.  . Cardiac catheterization Left 06/11/2004    Angiographically patent coronary arteries, normal LV end-diastolic pressure.  . Cardiovascular stress test  05/22/2008    Perfusion defect in inferior myocardial region is consistent with diaphragmatic attenuation. Remaining myocardium demonstrates normal myocardial perfusion with no evidence of ischemia of infarct. No ECG changes. EKG negative for ischemia.  . Transthoracic echocardiogram  10/11/2012    EF <20%, moderate concentric hypertrophy, grade 1 diastolic dysfunction, aortic valve sclerodid (not stenosis).   MEDICATIONS AND ALLERGIES REVIEWED IN EPIC No Change in Social and Family History  ROS: A comprehensive Review of Systems - was performed to the best of our ability.3 Review of Systems  Constitutional: Negative for fever, chills and malaise/fatigue.  She simply states that she just does not do much besides sitting on the house. She does not get out of the house much  HENT:       Very difficult to understands her speech  Eyes: Negative for blurred vision and double vision.  Respiratory: Negative for cough, hemoptysis, sputum production  and wheezing.   Cardiovascular:       Per history of present illness  Gastrointestinal: Positive for heartburn. Negative for blood in stool and melena.  Genitourinary: Negative for hematuria and flank pain.  Musculoskeletal: Positive for back pain and joint pain.  Neurological: Positive for speech change and weakness. Negative for dizziness, sensory change, focal weakness, seizures, loss of consciousness and headaches.       Just abnormal speech as previously noted  Psychiatric/Behavioral: Negative for depression. The patient is not nervous/anxious.   All other systems reviewed and are negative.   Wt Readings from Last 3 Encounters:  05/21/14 125 lb (56.7 kg)  04/03/14 125 lb (56.7 kg)  02/26/14 122 lb 6.4 oz (55.52 kg)    PHYSICAL EXAM BP 132/64  Pulse 77  Ht  (1.473 m)  Wt 125 lb (56.7 kg)  BMI 26.13 kg/m2 General: She is a pleasant elderly woman. NAD, A&Ox3, Almost unintelligible speech.  HEENT: NCAT, EOMI, MMM, and a protuberant jaw with a thick tongue and a speech impediment. Anicteric sclerae.  Neck: Supple; no LAN, JVD, or carotid bruit. The kyphosis makes it difficult to really tell if there is JVD.  Lungs: CTAB,nonlabored, normal effort, good air movement.  Heart: RRR, soft S4 gallop, normal S1, S2. soft 1-2/6 crescendo- decrescendo SEM at RUSB. No murmur  Abdomen: Soft/NT/ND/NABS. No HSM.  Extremities: No C/C with 1+ pitting bilaterally.   Adult ECG Report  Rate: 77 ;  Rhythm: normal sinus rhythm and With LVH and ST-T wave changes consistent with LVH.   Narrative Interpretation: Stable EKG  Recent Labs not available:    ASSESSMENT / PLAN: Non-ischemic cardiomyopathy Last echo showed EF of 20%, however she is not acting as though she has severe heart failure. Her edema is the best it is being. She is not noting any PND, orthopnea. In order to respect her wishes, we are not to pursue any further evaluation of her ejection fraction, we'll simply continue on  with her diuretic regimen as monitored by her nephrologist I would like for her to be on hydralazine Imdur combination for afterload reduction. She appears to be on Lopressor and I would prefer her to be on Toprol. Will defer final medication she is truly on and I would prefer to change her prescription to metoprolol succinate/Toprol given her cardiomyopathy.  Coronary artery disease, non-occlusive No plans for further evaluation. She has not had any anginal symptoms. Is on aspirin and statin as well as beta blocker and nitrate.  HYPERTENSION, BENIGN SYSTEMIC - Labile This is probably the best I've seen her blood pressure unfortunately were not sure what medication she is actually taking. Hopefully she is taking hydralazine and Imdur.  Would not make any additional adjustments besides switching from short-acting to long-acting metoprolol as noted above  EDEMA-LEGS,DUE TO VENOUS OBSTRUCT. Stable, and the best it has been as far as I can tell on current dose of Lasix. Moderate by primary physician and nephrologist as well  Tachycardia-bradycardia - refuses PPM/ICD Seems to be tolerating low-dose beta blocker. Would not titrate up, but would be okay just convert to a crippling dosing of a long-acting beta blocker.  Health  care home, active care coordination Currently has home health aide who spends at least 2 hours per day with her.  DNR/DNI   No orders of the defined types were placed in this encounter.   No orders of the defined types were placed in this encounter.    Followup: 6 months  Rozlynn Lippold W. Herbie Baltimore, M.D., M.S. Interventional Cardiologist [facility]   Addendum: 05/22/2014 Spoke to Frances [name]  Here is the list of medications for Ms Frances [name] 60 mg daily  Atorvastatin 40 mg bedtime  Aspirin 81 mg daily  Vit D 400 iu twice a day  Tramadol 50 mg prn  Metoprolol Tart 25 mg twice a day (Lopressor) Furosemide 20 mg twice a day  Omperazole 20 mg twice a day    Hydralazine 25 mg 1/2 tablet twice a day (prescription direction reads "take 1 tablet twice a day"  NTG SL PRN  -- Plan will be to convert her Lopressor  twice a day to Toprol 50 mg  Marykay Lex, MD

## 2014-05-22 NOTE — Telephone Encounter (Signed)
Spoke to Frances [name] Here is the list of medications for Ms [place] Amedeo 60 mg daily Atorvastatin 40 mg bedtime Aspirin 81 mg daily Vit D 400 iu twice a day Tramadol 50 mg prn Metoprolol Tart 25 mg twice a day Furosemide 20 mg twice a day Omperazole 20 mg twice a day Hydralazine 25 mg  1/2 tablet twice a day  (prescription direction reads "take 1 tablet twice a day" NTG SL PRN RN informed Frances [name] will call tomorrow,once Dr Herbie Baltimore review the list today. She verbalized understanding.

## 2014-05-30 NOTE — Telephone Encounter (Signed)
RN spoke to Frances [name]. Per Dr Lillie Fragmin with current medication no changes at present time.  Frances [name] would like the correct direction on the HYDRALAZINE bottle.  RN spoke to  Pharmacist KIM- direction are 1/2 tablet twice a day with x 12 refills --will place on file.

## 2014-07-09 ENCOUNTER — Encounter (HOSPITAL_COMMUNITY): Payer: Self-pay | Admitting: Emergency Medicine

## 2014-07-09 ENCOUNTER — Telehealth: Payer: Self-pay | Admitting: Cardiology

## 2014-07-09 ENCOUNTER — Inpatient Hospital Stay (HOSPITAL_COMMUNITY)
Admission: EM | Admit: 2014-07-09 | Discharge: 2014-07-10 | DRG: 313 | Disposition: A | Discharge status: Home Health | Payer: Medicare Other | Attending: Family Medicine | Admitting: Family Medicine

## 2014-07-09 ENCOUNTER — Emergency Department (HOSPITAL_COMMUNITY): Payer: Medicare Other

## 2014-07-09 DIAGNOSIS — N179 Acute kidney failure, unspecified: Secondary | ICD-10-CM | POA: Diagnosis present

## 2014-07-09 DIAGNOSIS — Z8249 Family history of ischemic heart disease and other diseases of the circulatory system: Secondary | ICD-10-CM | POA: Diagnosis not present

## 2014-07-09 DIAGNOSIS — I2510 Atherosclerotic heart disease of native coronary artery without angina pectoris: Secondary | ICD-10-CM | POA: Diagnosis present

## 2014-07-09 DIAGNOSIS — Z87891 Personal history of nicotine dependence: Secondary | ICD-10-CM

## 2014-07-09 DIAGNOSIS — Z66 Do not resuscitate: Secondary | ICD-10-CM | POA: Diagnosis present

## 2014-07-09 DIAGNOSIS — E875 Hyperkalemia: Secondary | ICD-10-CM | POA: Diagnosis present

## 2014-07-09 DIAGNOSIS — N184 Chronic kidney disease, stage 4 (severe): Secondary | ICD-10-CM | POA: Diagnosis present

## 2014-07-09 DIAGNOSIS — Z79899 Other long term (current) drug therapy: Secondary | ICD-10-CM | POA: Diagnosis not present

## 2014-07-09 DIAGNOSIS — E785 Hyperlipidemia, unspecified: Secondary | ICD-10-CM | POA: Diagnosis present

## 2014-07-09 DIAGNOSIS — R0789 Other chest pain: Principal | ICD-10-CM | POA: Diagnosis present

## 2014-07-09 DIAGNOSIS — I428 Other cardiomyopathies: Secondary | ICD-10-CM | POA: Diagnosis present

## 2014-07-09 DIAGNOSIS — N189 Chronic kidney disease, unspecified: Secondary | ICD-10-CM

## 2014-07-09 DIAGNOSIS — I129 Hypertensive chronic kidney disease with stage 1 through stage 4 chronic kidney disease, or unspecified chronic kidney disease: Secondary | ICD-10-CM | POA: Diagnosis present

## 2014-07-09 DIAGNOSIS — I495 Sick sinus syndrome: Secondary | ICD-10-CM | POA: Diagnosis present

## 2014-07-09 DIAGNOSIS — Z7982 Long term (current) use of aspirin: Secondary | ICD-10-CM | POA: Diagnosis not present

## 2014-07-09 DIAGNOSIS — R079 Chest pain, unspecified: Secondary | ICD-10-CM

## 2014-07-09 DIAGNOSIS — I10 Essential (primary) hypertension: Secondary | ICD-10-CM

## 2014-07-09 LAB — URINALYSIS, ROUTINE W REFLEX MICROSCOPIC
Bilirubin Urine: NEGATIVE
Glucose, UA: NEGATIVE mg/dL
HGB URINE DIPSTICK: NEGATIVE
KETONES UR: NEGATIVE mg/dL
Nitrite: NEGATIVE
Protein, ur: NEGATIVE mg/dL
SPECIFIC GRAVITY, URINE: 1.012 (ref 1.005–1.030)
Urobilinogen, UA: 0.2 mg/dL (ref 0.0–1.0)
pH: 5 (ref 5.0–8.0)

## 2014-07-09 LAB — CBC
HCT: 37.5 % (ref 36.0–46.0)
HEMOGLOBIN: 11.7 g/dL — AB (ref 12.0–15.0)
MCH: 27.7 pg (ref 26.0–34.0)
MCHC: 31.2 g/dL (ref 30.0–36.0)
MCV: 88.9 fL (ref 78.0–100.0)
PLATELETS: 154 10*3/uL (ref 150–400)
RBC: 4.22 MIL/uL (ref 3.87–5.11)
RDW: 13.2 % (ref 11.5–15.5)
WBC: 4.9 10*3/uL (ref 4.0–10.5)

## 2014-07-09 LAB — I-STAT TROPONIN, ED: TROPONIN I, POC: 0.03 ng/mL (ref 0.00–0.08)

## 2014-07-09 LAB — BASIC METABOLIC PANEL
Anion gap: 18 — ABNORMAL HIGH (ref 5–15)
BUN: 71 mg/dL — ABNORMAL HIGH (ref 6–23)
CALCIUM: 9.2 mg/dL (ref 8.4–10.5)
CO2: 16 mEq/L — ABNORMAL LOW (ref 19–32)
Chloride: 108 mEq/L (ref 96–112)
Creatinine, Ser: 3.92 mg/dL — ABNORMAL HIGH (ref 0.50–1.10)
GFR calc non Af Amer: 10 mL/min — ABNORMAL LOW (ref >90)
GFR, EST AFRICAN AMERICAN: 11 mL/min — AB (ref >90)
GLUCOSE: 107 mg/dL — AB (ref 70–99)
POTASSIUM: 5.6 meq/L — AB (ref 3.7–5.3)
Sodium: 142 mEq/L (ref 137–147)

## 2014-07-09 LAB — URINE MICROSCOPIC-ADD ON

## 2014-07-09 MED ORDER — DEXTROSE 5 % IV SOLN
1.0000 g | Freq: Once | INTRAVENOUS | Status: DC
  Filled 2014-07-09: qty 10

## 2014-07-09 MED ORDER — SODIUM POLYSTYRENE SULFONATE 15 GM/60ML PO SUSP
15.0000 g | Freq: Once | ORAL | Status: AC
  Administered 2014-07-09: 15 g via ORAL
  Filled 2014-07-09: qty 60

## 2014-07-09 NOTE — Telephone Encounter (Signed)
Antion[facility] is calling in stating that the pt is having loss in appetite and having some chest pains. She would like to know if she needs to take the pt to the ER or not. Please call

## 2014-07-09 NOTE — Telephone Encounter (Signed)
Spoke with pt caregiver, when she got to the patients home this morning the patient reported she was having chest pain. The patient was broke out in a sweat but no other symptoms. The patient was given one NTG and is currently pain free. The caregiver reports she had another episode last week that was relieved with NTG. Medications confirm and were taken by the patient between 7:30a and 7:45a. Patient does not want to go to the hospital. Will forward to dr harding for his review. Caregiver to call back if symptoms return.

## 2014-07-09 NOTE — ED Provider Notes (Signed)
CSN: [id number]     Arrival date & time 07/09/14  1430 History   First MD Initiated Contact with Patient 07/09/14 1658     Chief Complaint  Patient presents with  . Chest Pain     (Consider location/radiation/quality/duration/timing/severity/associated sxs/prior Treatment) HPI Comments: Patient presents to the ER for evaluation of chest pain. Patient had an episode of chest pain earlier this morning. Patient was given nitroglycerin with resolution of her pain. Caregiver reports that upon rechecking her later in the morning, patient was complaining of pain once again and became sweaty. There was no associated shortness of breath. Upon arrival to the ER, patient is no longer experiencing any chest pain. Caregiver reports that the patient has not been eating or drinking much over the last 1 month. Has had progressive decline in her energy, has been very weak.  Patient is a 80 y.o. female presenting with chest pain.  Chest Pain Associated symptoms: diaphoresis and fatigue     Past Medical History  Diagnosis Date  . Coronary artery disease, non-occlusive 2005    Last cath in 2005 - no significant disease; Myoview 2009 no ischemia  . Hypertension - labile   . Renal disorder   . UTI (lower urinary tract infection) 10/10/2012  . Stroke   . CKD (chronic kidney disease), stage IV   . Speech impediment   . Dyslipidemia   . Non-ischemic cardiomyopathy     Echo 09/2012:EF <20% with severe global hypokinesis (down from ~45% in 2012), Aortic Sclerosis.  Trivial pericardial effusion; Pt declined furhter investigation, Myoview if + would lead to cath & she does not want cath.  . Tachycardia-bradycardia     Declines PPM  . Paresthesia     fingers  . Dizziness   . CHF (congestive heart failure)    Past Surgical History  Procedure Laterality Date  . Abdominal surgery      midline abdominal scar   . Knee surgery      both knees   . Renal doppler  05/02/2005    Non-specific medical renal disease  with bilateral renal cysts. Otherwise no significant abnormality with no obstructive uropathy.  . Cardiac catheterization Left 06/11/2004    Angiographically patent coronary arteries, normal LV end-diastolic pressure.  . Cardiovascular stress test  05/22/2008    Perfusion defect in inferior myocardial region is consistent with diaphragmatic attenuation. Remaining myocardium demonstrates normal myocardial perfusion with no evidence of ischemia of infarct. No ECG changes. EKG negative for ischemia.  . Transthoracic echocardiogram  10/11/2012    EF <20%, moderate concentric hypertrophy, grade 1 diastolic dysfunction, aortic valve sclerodid (not stenosis).   Family History  Problem Relation Age of Onset  . Heart attack Daughter    History  Substance Use Topics  . Smoking status: Former Smoker    Types: Cigarettes    Start date: 05/16/1993  . Smokeless tobacco: Never Used  . Alcohol Use: No   OB History   Grav Para Term Preterm Abortions TAB SAB Ect Mult Living                 Review of Systems  Constitutional: Positive for diaphoresis and fatigue.  Cardiovascular: Positive for chest pain.  All other systems reviewed and are negative.     Allergies  Catapres  Home Medications   Prior to Admission medications   Medication Sig Start Date End Date Taking? Authorizing Provider  aspirin EC 81 MG tablet Take 81 mg by mouth daily.    Historical Provider,  MD  atorvastatin (LIPITOR) 40 MG tablet Take 40 mg by mouth daily.    Historical Provider, MD  cinacalcet (SENSIPAR) 60 MG tablet Take 60 mg by mouth daily.      Historical Provider, MD  furosemide (LASIX) 20 MG tablet Take 1 tablet (20 mg total) by mouth 2 (two) times daily. 12/19/13   Carney Living, MD  hydrALAZINE (APRESOLINE) 25 MG tablet Take 0.5 tablets (12.5 mg total) by mouth 2 (two) times daily. 01/23/14   Leighton Roach McDiarmid, MD  isosorbide mononitrate (IMDUR) 30 MG 24 hr tablet Take 30 mg by mouth daily.    Historical  Provider, MD  metoprolol tartrate (LOPRESSOR) 25 MG tablet TAKE 1 TABLET TWICE DAILY. 10/30/13   Marykay Lex, MD  nitroGLYCERIN (NITROSTAT) 0.4 MG SL tablet Place 0.4 mg under the tongue every 5 (five) minutes as needed for chest pain. 10/13/12   Tommie Sams, DO  omeprazole (PRILOSEC) 20 MG capsule TAKE (1) CAPSULE DAILY.    Carney Living, MD  traMADol (ULTRAM) 50 MG tablet Take 1 tablet (50 mg total) by mouth 2 (two) times daily as needed for moderate pain. 04/03/14   Carney Living, MD  vitamin D, CHOLECALCIFEROL, 400 UNITS tablet Take 2 tablets (800 Units total) by mouth daily. 05/21/14   Carney Living, MD   BP 157/62  Pulse 73  Temp(Src) 97.8 F (36.6 C) (Oral)  Resp 16  SpO2 97% Physical Exam  Constitutional: She is oriented to person, place, and time. She appears well-developed and well-nourished. No distress.  HENT:  Head: Normocephalic and atraumatic.  Right Ear: Hearing normal.  Left Ear: Hearing normal.  Nose: Nose normal.  Mouth/Throat: Oropharynx is clear and moist and mucous membranes are normal.  Eyes: Conjunctivae and EOM are normal. Pupils are equal, round, and reactive to light.  Neck: Normal range of motion. Neck supple.  Cardiovascular: Regular rhythm, S1 normal and S2 normal.  Exam reveals no gallop and no friction rub.   No murmur heard. Pulmonary/Chest: Effort normal and breath sounds normal. No respiratory distress. She exhibits no tenderness.  Abdominal: Soft. Normal appearance and bowel sounds are normal. There is no hepatosplenomegaly. There is no tenderness. There is no rebound, no guarding, no tenderness at McBurney's point and negative Murphy's sign. No hernia.  Musculoskeletal: Normal range of motion.  Neurological: She is alert and oriented to person, place, and time. She has normal strength. No cranial nerve deficit or sensory deficit. Coordination normal. GCS eye subscore is 4. GCS verbal subscore is 5. GCS motor subscore is 6.  Skin:  Skin is warm, dry and intact. No rash noted. No cyanosis.  Psychiatric: She has a normal mood and affect. Her speech is normal and behavior is normal. Thought content normal.    ED Course  Procedures (including critical care time) Labs Review Labs Reviewed  CBC - Abnormal; Notable for the following:    Hemoglobin 11.7 (*)    All other components within normal limits  BASIC METABOLIC PANEL - Abnormal; Notable for the following:    Potassium 5.6 (*)    CO2 16 (*)    Glucose, Bld 107 (*)    BUN 71 (*)    Creatinine, Ser 3.92 (*)    GFR calc non Af Amer 10 (*)    GFR calc Af Amer 11 (*)    Anion gap 18 (*)    All other components within normal limits  URINALYSIS, ROUTINE W REFLEX MICROSCOPIC  I-STAT TROPOININ,  ED    Imaging Review Dg Chest 2 View  07/09/2014   CLINICAL DATA:  Congestive heart failure, or hypertension, mid chest pain  EXAM: CHEST  2 VIEW  COMPARISON:  Radiograph 10/23/2013  FINDINGS: Normal mediastinum and cardiac silhouette. Chronic central bronchitic markings. Normal pulmonary vasculature. No effusion, infiltrate, or pneumothorax. Splenic calcification noted and unchanged.  IMPRESSION: Chronic bronchitic markings.  No acute findings.   Electronically Signed   By: Genevive BiStewart  Edmunds M.D.   On: 07/09/2014 17:39     EKG Interpretation   Date/Time:  Monday July 09 2014 14:40:06 EDT Ventricular Rate:  78 PR Interval:  153 QRS Duration: 104 QT Interval:  404 QTC Calculation: 460 R Axis:   61 Text Interpretation:  Sinus rhythm Consider left atrial enlargement  Probable left ventricular hypertrophy Nonspecific T abnormalities,  inferior leads Anterior ST elevation, probably due to LVH No significant  change was found Confirmed by CAMPOS  MD, KEVIN ([id number]) on 07/09/2014  2:43:37 PM      MDM   Final diagnoses:  None   chest pain  Acute on chronic renal failure  Hyperkalemia  Patient presents to the ER for evaluation of chest pain. Patient had an episode  of chest pain earlier which resolved after nitroglycerin. The patient had recurrence of pain associated with some diaphoresis later in the day. Upon evaluation in the ER, however, pain has resolved. Her initial EKG did not show any acute changes from previous EKG. Initial troponin was negative.  Patient's blood work has revealed worsening renal failure. Creatinine is 3.92 with a significantly elevated BUN compared to baseline. Baseline creatinine is in the mid 2 range. Patient has concomitant mild hyperkalemia without associated EKG changes. Caregiver reports that she has not been eating or drinking, likely some dehydration associated renal insufficiency.  Based on the worsening renal function, hyperkalemia, as well as the chest pain, patient will require hospitalization for further management.    Gilda Creasehristopher J. Elaijah Munoz, MD 07/09/14 [id number]

## 2014-07-09 NOTE — H&P (Signed)
Family Medicine Teaching [facility] Admission History and Physical Service Pager: [phone]  Patient name: Frances [name] Medical record number: [id number] Date of birth: 11-29-1928 Age: 80 y.o. Gender: female  Primary Care Provider: Carney Living, MD Consultants: None Code Status: DNR per discussion with patient, who is alert and oriented  Chief Complaint: chest pain found to have AoCKD  Assessment and Plan: Frances [name] is a 80 y.o. female presenting with chest pain and found to have AoCKD . PMH is complex and significant for CKD stage IV, CAD (non-occlusive), DJD, HTN, sinus pause, refuses pacemaker, tachy-brady, refuses PPM/ICD.  Chest pain Afebrile, vital signs stable, no leukocytosis, Hgb 11.7, CXR with no acute findings. EKG with ?anterior lead ST elevation and POC trop neg. Resolved spontaneously. Age, HLD, CAD (non-occlusive) to warrant CP rule-out. - Admit to [facility] Medicine Teaching Service, attending Dr Gwendolyn Grant, for inpatient telemetry bed - F/u troponins and AM EKG - Pulse oximetry - Daily weights and RCB:ULAG - Continue Imdur - ?D/w cards - patient sees Dr Bryan Lemma - Continue lipitor and ASA.  Renal insufficiency AoCKD, likely related to poor PO endorsed by caregiver. CO2 16, gap 18. Likely due to uremia with BUN 71 and Cr 3.92, well above baseline. - IV fluids gentle - clear liquid, swallow study - monitor and f/u BMET - goc discussion with patient not POing well is warranted. Would have her caregiver present. - Hold lasix. Continue home vitamin D.  Hyperkalemia 5.6 likely due to renal insufficiency acutely. S/p kayexilate at [facility]. EKG with no peaked T waves and no changes per ED provider. - Recheck in AM. - AM EKG  Asymptomatic bacteruria UA with large lueks and 21-50 WBC but only rare bacteria. Asymptomatic. - No tx for now - UCx obtained. If symptoms occur will treat.  HTN At goal currently.  - Continue home imdur, hydralazine, metoprolol.  Holding lasix given AoCKD.  FEN/GI: requesting water. Will allow sips. However, need bedside swallow in AM. 1/2 NS at 50cc/hr. Prophylaxis: no h/o bleeding. SQ heparin.   Disposition: Pending chest pain workup and AoCKD evaluation - pt / ot for recommendations - Consider goals of care discussion. Pt states she makes own decisions but aid helps her with medications and money.  History of Present Illness: Frances [name] is a 80 y.o. female presenting with chest pain. She was brought in to [facility] by her caregiver. Hx at [facility] was through patient and her caregiver who can understand her. Hx limited at [facility] due to caregiver not present and patient very difficult to understand due to no teeth.  She reports she had pain for 30 minutes this morning that resolved spontaneously. This happened 2-3 months ago most recently. Denies dyspnea, lightheadedness/dizziness, syncope, or nausea at that time. However, a few minutes later the pain seemed to have returned with diaphoresis. She took a nitroglycerin and pain improved. Currently she is pain-free. In the [facility], troponin and EKG were done and not concerning. She has h/o NICM with no known blockages but with non-occlusive CAD per cards, EF 20%. She is seen at [facility] heart care.  She also has h/o renal insufficiency with baseline Cr mid-2. Per her caregiver, she had not been eating much over the past month. The patient states she drinks daily but does not eat much and endorses diminished appetite. Creatinine and BUN checked at [facility] were 3.92 and 70 (bseline 40s).  States she did not take medication this morning.   In the ED, she received kayexilate  15g x 1.  Review Of Systems: Per HPI with the following additions: She denies other symptoms including dysuria, fever, chills, diarrhea, vomitng, or nausea. She tries to eat 2 meals daily.  Otherwise 12 point review of systems was performed and was unremarkable.  Patient Active Problem List   Diagnosis Date Noted   . Acute on chronic renal failure 07/09/2014  . Abdominal pain, epigastric 11/01/2013  . Bilateral leg edema 05/21/2013    Class: Chronic  . Non-ischemic cardiomyopathy   . Tachycardia-bradycardia - refuses PPM/ICD 10/21/2012  . Sinus pause, refuses pacemaker/ICD 10/12/2012  . At high risk for falls 11/30/2011  . Cholecystitis 04/29/2011  . Health care home, active care coordination 04/08/2011  . HYPERTENSION, BENIGN SYSTEMIC - Labile 11/25/2006  . EDEMA-LEGS,DUE TO VENOUS OBSTRUCT. 11/25/2006  . CHRONIC KIDNEY DISEASE STAGE IV (SEVERE) 11/25/2006  . DJD (degenerative joint disease) 11/25/2006  . Coronary artery disease, non-occlusive 05/21/2004   Past Medical History: Past Medical History  Diagnosis Date  . Coronary artery disease, non-occlusive 2005    Last cath in 2005 - no significant disease; Myoview 2009 no ischemia  . Hypertension - labile   . Renal disorder   . UTI (lower urinary tract infection) 10/10/2012  . Stroke   . CKD (chronic kidney disease), stage IV   . Speech impediment   . Dyslipidemia   . Non-ischemic cardiomyopathy     Echo 09/2012:EF <20% with severe global hypokinesis (down from ~45% in 2012), Aortic Sclerosis.  Trivial pericardial effusion; Pt declined furhter investigation, Myoview if + would lead to cath & she does not want cath.  . Tachycardia-bradycardia     Declines PPM  . Paresthesia     fingers  . Dizziness   . CHF (congestive heart failure)    Past Surgical History: Past Surgical History  Procedure Laterality Date  . Abdominal surgery      midline abdominal scar   . Knee surgery      both knees   . Renal doppler  05/02/2005    Non-specific medical renal disease with bilateral renal cysts. Otherwise no significant abnormality with no obstructive uropathy.  . Cardiac catheterization Left 06/11/2004    Angiographically patent coronary arteries, normal LV end-diastolic pressure.  . Cardiovascular stress test  05/22/2008    Perfusion defect in  inferior myocardial region is consistent with diaphragmatic attenuation. Remaining myocardium demonstrates normal myocardial perfusion with no evidence of ischemia of infarct. No ECG changes. EKG negative for ischemia.  . Transthoracic echocardiogram  10/11/2012    EF <20%, moderate concentric hypertrophy, grade 1 diastolic dysfunction, aortic valve sclerodid (not stenosis).   Social History: History  Substance Use Topics  . Smoking status: Former Smoker    Types: Cigarettes    Start date: 05/16/1993  . Smokeless tobacco: Never Used  . Alcohol Use: No   Additional social history:  Lives alone [place] with no falls this past year Uses 4 pronged cane. Has walker. Uses bathroom on own Has aid to cook for her  Please also refer to relevant sections of EMR.  Family History: Family History  Problem Relation Age of Onset  . Heart attack Daughter    Allergies and Medications: Allergies  Allergen Reactions  . Catapres [Clonidine Hcl] Rash   No current facility-administered medications on file prior to encounter.   Current Outpatient Prescriptions on File Prior to Encounter  Medication Sig Dispense Refill  . aspirin EC 81 MG tablet Take 81 mg by mouth daily.      Marland Kitchen  atorvastatin (LIPITOR) 40 MG tablet Take 40 mg by mouth daily.      . cinacalcet (SENSIPAR) 60 MG tablet Take 60 mg by mouth daily.        . furosemide (LASIX) 20 MG tablet Take 1 tablet (20 mg total) by mouth 2 (two) times daily.  60 tablet  6  . hydrALAZINE (APRESOLINE) 25 MG tablet Take 0.5 tablets (12.5 mg total) by mouth 2 (two) times daily.      . isosorbide mononitrate (IMDUR) 30 MG 24 hr tablet Take 30 mg by mouth daily.      . metoprolol tartrate (LOPRESSOR) 25 MG tablet TAKE 1 TABLET TWICE DAILY.  60 tablet  6  . nitroGLYCERIN (NITROSTAT) 0.4 MG SL tablet Place 0.4 mg under the tongue every 5 (five) minutes as needed for chest pain.      Marland Kitchen. omeprazole (PRILOSEC) 20 MG capsule TAKE (1) CAPSULE DAILY.  30 capsule  6   . traMADol (ULTRAM) 50 MG tablet Take 1 tablet (50 mg total) by mouth 2 (two) times daily as needed for moderate pain.  60 tablet  5  . vitamin D, CHOLECALCIFEROL, 400 UNITS tablet Take 2 tablets (800 Units total) by mouth daily.  60 tablet  1    Objective: BP 149/72  Pulse 70  Temp(Src) 97.5 F (36.4 C) (Oral)  Resp 17  Wt 124 lb 8 oz (56.473 kg)  SpO2 100% Exam: General: NAD elderly female lying in bed HEENT: AT/[place], no teeth, protruding tongue, o/p clear, EOMI, arcus senilis, supple neck Cardiovascular: RRR, no m/r/g Respiratory: CTAB, normal effort Abdomen: S/NT/ND, no suprapubic tenderness Extremities: No LE edema or calf tenderness Skin: No rash or cyanosis Neuro: Awake, alert, no focal deficits, speech very difficult to understand due to no dentition and protruding tongue, but interacts appropriately, oriented x 3, no focal deficits  Labs and Imaging: CBC BMET   Recent Labs Lab 07/09/14 1448  WBC 4.9  HGB 11.7*  HCT 37.5  PLT 154    Recent Labs Lab 07/09/14 1448  NA 142  K 5.6*  CL 108  CO2 16*  BUN 71*  CREATININE 3.92*  GLUCOSE 107*  CALCIUM 9.2     2 view CXR: IMPRESSION:  Chronic bronchitic markings. No acute findings.    Leona SingletonMaria T Aidan Moten, MD 07/09/2014, 11:00 PM PGY-3, [facility] FPTS Intern pager: [phone], text pages welcome

## 2014-07-09 NOTE — ED Notes (Signed)
Report called to floor. [facility] contacted for transport.

## 2014-07-09 NOTE — ED Notes (Signed)
Registration called to register pt, no response.

## 2014-07-09 NOTE — ED Notes (Signed)
Pt denies pain at present time. Pt reports central CP, dizziness, and diaphoresis this am. Pt given nitro with relief. Pt denies pain since.

## 2014-07-09 NOTE — ED Notes (Addendum)
Pt here with caregiver, Pt started c/o chest pain this morning, given nitro with relief, caregiver came back with pt sweating along with chest pain. Caregiver also states pt has not been eating adequately x 1 month. Pt denies SOB

## 2014-07-09 NOTE — Progress Notes (Signed)
Pt arrive to room 2w04.  Resting in bed no complaints of pain at this time.  Call light in reach and bed alarm set; MD paged; RN will continue to monitor.   Thane Edu, RN

## 2014-07-10 DIAGNOSIS — N184 Chronic kidney disease, stage 4 (severe): Secondary | ICD-10-CM

## 2014-07-10 DIAGNOSIS — E875 Hyperkalemia: Secondary | ICD-10-CM

## 2014-07-10 DIAGNOSIS — I10 Essential (primary) hypertension: Secondary | ICD-10-CM

## 2014-07-10 DIAGNOSIS — R079 Chest pain, unspecified: Secondary | ICD-10-CM

## 2014-07-10 LAB — CREATININE, SERUM
Creatinine, Ser: 3.76 mg/dL — ABNORMAL HIGH (ref 0.50–1.10)
GFR calc Af Amer: 12 mL/min — ABNORMAL LOW (ref >90)
GFR, EST NON AFRICAN AMERICAN: 10 mL/min — AB (ref >90)

## 2014-07-10 LAB — CBC
HEMATOCRIT: 35.9 % — AB (ref 36.0–46.0)
HEMATOCRIT: 35.4 % — AB (ref 36.0–46.0)
HEMOGLOBIN: 11.3 g/dL — AB (ref 12.0–15.0)
Hemoglobin: 11.2 g/dL — ABNORMAL LOW (ref 12.0–15.0)
MCH: 27.2 pg (ref 26.0–34.0)
MCH: 27.6 pg (ref 26.0–34.0)
MCHC: 31.2 g/dL (ref 30.0–36.0)
MCHC: 31.9 g/dL (ref 30.0–36.0)
MCV: 87.1 fL (ref 78.0–100.0)
MCV: 86.3 fL (ref 78.0–100.0)
Platelets: 155 10*3/uL (ref 150–400)
Platelets: 142 10*3/uL — ABNORMAL LOW (ref 150–400)
RBC: 4.12 MIL/uL (ref 3.87–5.11)
RBC: 4.1 MIL/uL (ref 3.87–5.11)
RDW: 13.2 % (ref 11.5–15.5)
RDW: 13.1 % (ref 11.5–15.5)
WBC: 4.9 10*3/uL (ref 4.0–10.5)
WBC: 4.7 10*3/uL (ref 4.0–10.5)

## 2014-07-10 LAB — BASIC METABOLIC PANEL
Anion gap: 17 — ABNORMAL HIGH (ref 5–15)
BUN: 70 mg/dL — ABNORMAL HIGH (ref 6–23)
CALCIUM: 9.2 mg/dL (ref 8.4–10.5)
CHLORIDE: 105 meq/L (ref 96–112)
CO2: 17 meq/L — AB (ref 19–32)
Creatinine, Ser: 3.72 mg/dL — ABNORMAL HIGH (ref 0.50–1.10)
GFR calc Af Amer: 12 mL/min — ABNORMAL LOW (ref >90)
GFR calc non Af Amer: 10 mL/min — ABNORMAL LOW (ref >90)
Glucose, Bld: 62 mg/dL — ABNORMAL LOW (ref 70–99)
Potassium: 5.3 mEq/L (ref 3.7–5.3)
SODIUM: 139 meq/L (ref 137–147)

## 2014-07-10 LAB — TROPONIN I: Troponin I: <0.3 ng/mL (ref <0.30)

## 2014-07-10 LAB — URINE CULTURE: Colony Count: >=100000

## 2014-07-10 MED ORDER — CINACALCET HCL 30 MG PO TABS
60.0000 mg | ORAL_TABLET | Freq: Every day | ORAL | Status: DC
  Administered 2014-07-10: 60 mg via ORAL
  Filled 2014-07-10 (×2): qty 2

## 2014-07-10 MED ORDER — HYDRALAZINE HCL 25 MG PO TABS
12.5000 mg | ORAL_TABLET | Freq: Two times a day (BID) | ORAL | Status: DC
  Administered 2014-07-10 (×2): 12.5 mg via ORAL
  Filled 2014-07-10 (×4): qty 0.5

## 2014-07-10 MED ORDER — ASPIRIN EC 81 MG PO TBEC
81.0000 mg | DELAYED_RELEASE_TABLET | Freq: Every day | ORAL | Status: DC
  Administered 2014-07-10: 81 mg via ORAL
  Filled 2014-07-10: qty 1

## 2014-07-10 MED ORDER — SODIUM CHLORIDE 0.45 % IV SOLN
INTRAVENOUS | Status: DC
  Administered 2014-07-10: 01:00:00 via INTRAVENOUS

## 2014-07-10 MED ORDER — ISOSORBIDE MONONITRATE ER 30 MG PO TB24
30.0000 mg | ORAL_TABLET | Freq: Every day | ORAL | Status: DC
  Administered 2014-07-10: 30 mg via ORAL
  Filled 2014-07-10: qty 1

## 2014-07-10 MED ORDER — TRAMADOL HCL 50 MG PO TABS: 50.0000 mg | ORAL_TABLET | Freq: Two times a day (BID) | ORAL | Status: DC | PRN

## 2014-07-10 MED ORDER — METOPROLOL TARTRATE 25 MG PO TABS
25.0000 mg | ORAL_TABLET | Freq: Two times a day (BID) | ORAL | Status: DC
  Administered 2014-07-10 (×2): 25 mg via ORAL
  Filled 2014-07-10 (×3): qty 1

## 2014-07-10 MED ORDER — CHOLECALCIFEROL 10 MCG (400 UNIT) PO TABS
800.0000 [IU] | ORAL_TABLET | Freq: Every day | ORAL | Status: DC
  Administered 2014-07-10: 800 [IU] via ORAL
  Filled 2014-07-10: qty 2

## 2014-07-10 MED ORDER — SODIUM CHLORIDE 0.9 % IJ SOLN
3.0000 mL | Freq: Two times a day (BID) | INTRAMUSCULAR | Status: DC
  Administered 2014-07-10: 3 mL via INTRAVENOUS

## 2014-07-10 MED ORDER — PANTOPRAZOLE SODIUM 40 MG PO TBEC
40.0000 mg | DELAYED_RELEASE_TABLET | Freq: Every day | ORAL | Status: DC
  Administered 2014-07-10: 40 mg via ORAL

## 2014-07-10 MED ORDER — ATORVASTATIN CALCIUM 40 MG PO TABS
40.0000 mg | ORAL_TABLET | Freq: Every day | ORAL | Status: DC
  Administered 2014-07-10: 40 mg via ORAL
  Filled 2014-07-10: qty 1

## 2014-07-10 MED ORDER — HEPARIN SODIUM (PORCINE) 5000 UNIT/ML IJ SOLN
5000.0000 [IU] | Freq: Three times a day (TID) | INTRAMUSCULAR | Status: DC
  Administered 2014-07-10 (×2): 5000 [IU] via SUBCUTANEOUS
  Filled 2014-07-10 (×4): qty 1

## 2014-07-10 NOTE — Telephone Encounter (Signed)
I would increase the dose of Imdur to 60 mg.   She clearly indicated that she had no desire to have any invasive procedures.  I was not sure about her wishes to return to the hospital.  Certainly if her symptoms are not controlled, we can stabilize her in the hospital.  Marykay Lex, MD

## 2014-07-10 NOTE — Discharge Summary (Signed)
Family Medicine Teaching [facility] Discharge Summary  Patient name: Frances [name] Medical record number: [id number] Date of birth: 02/07/1929 Age: 80 y.o. Gender: female Date of Admission: 07/09/2014  Date of Discharge: 07/10/2014 Admitting Physician: Tobey Grim, MD  Primary Care Provider: Carney Living, MD Consultants: none  Indication for Hospitalization: Chest pain and AoCKD  Discharge Diagnoses/Problem List:  Chest pain, without ischemic changes on EKG Renal insufficiency Hyperkalemia  Disposition: Home  Discharge Condition: Stable  Discharge Exam:  General: NAD elderly female lying in bed  HEENT: AT/[employer], no teeth, protruding tongue, o/p clear, EOMI, arcus senilis, supple neck  Cardiovascular: RRR, no m/r/g  Respiratory: CTAB, normal effort  Abdomen: S/NT/ND, no suprapubic tenderness  Extremities: No LE edema or calf tenderness  Skin: No rash or cyanosis  Neuro: Awake, alert, no focal deficits, speech very difficult to understand due to no dentition and protruding tongue, but interacts appropriately, oriented x 3, no focal deficits   Brief Hospital Course:  Frances [name] is a 80 y.o. female who presented with chest pain. She was brought in to [facility] by her caregiver. Hx at [facility] was through patient and her caregiver who can understand her. Hx limited at [facility] due to caregiver not present and patient very difficult to understand due to no teeth. She reports she had pain for 30 minutes the morning of admission that resolved spontaneously. This happened 2-3 months ago as well. Denies dyspnea, lightheadedness/dizziness, syncope, or nausea at that time. However, a few minutes later the pain seemed to have returned with diaphoresis. She took a nitroglycerin and pain improved. She was pain-free in the ED. In the [facility], troponin and EKG were done and not concerning. She has h/o NICM with no known blockages but with non-occlusive CAD per cards, EF 20%. She is seen at  [facility] heart care.   She also has h/o renal insufficiency with baseline Cr mid-2. Per her caregiver, she had not been eating much over the past month. Creatinine and BUN checked at [facility] were 3.92 and 70 (bseline 40s).   In the ED, she received kayexilate 15g x 1. Cycled troponins were negative x3. Repeat EKG the following morning showed no changes/unremarkable. Creatinine showed slight improvement overnight with a value of 3.76. Patient's BUN continues at 70. Patient was also found have mildly elevated anion gap of 17 which is most likely due to her diminished renal function. Patient's hemoglobin was stable throughout her stay. No leukocytosis. Patient was afebrile throughout her stay. She had no additional complaints of chest pain dyspnea lightheadedness dizziness.   Patient was deemed medically cleared for discharge and had stated multiple times her desire to be discharged from our service because she "felt good".  During her stay he was found the patient had not been taking 3 of her prescribed medications. Because of this we did not continue these medications upon discharge. These medications were Prilosec, metoprolol, and Imdur. At the time of her discharge blood pressures appeared to be well-maintained in the 160s/50s.   Issues for Follow Up:  - Patient's diminishing renal function may warrant future dialysis. Patient uninterested in dialysis at this time. - Patient's diminishing functionality may warrant future SNF placement - Patient's desire to withhold major medical interventions and medications may warrant future referral to palliative care.   Significant Procedures: none  Significant Labs and Imaging:   Recent Labs Lab 07/09/14 1448 07/10/14 0108 07/10/14 0700  WBC 4.9 4.9 4.7  HGB 11.7* 11.2* 11.3*  HCT 37.5 35.9*  35.4*  PLT 154 155 142*    Recent Labs Lab 07/09/14 1448 07/10/14 0108 07/10/14 0700  NA 142  --  139  K 5.6*  --  5.3  CL 108  --  105  CO2 16*  --  17*   GLUCOSE 107*  --  62*  BUN 71*  --  70*  CREATININE 3.92* 3.76* 3.72*  CALCIUM 9.2  --  9.2    troponins neg x3  Results/Tests Pending at Time of Discharge: Urine Culture.  Discharge Medications:    Medication List         aspirin EC 81 MG tablet  Take 81 mg by mouth daily.     atorvastatin 40 MG tablet  Commonly known as:  LIPITOR  Take 40 mg by mouth daily.     cinacalcet 60 MG tablet  Commonly known as:  SENSIPAR  Take 60 mg by mouth daily.     furosemide 20 MG tablet  Commonly known as:  LASIX  Take 1 tablet (20 mg total) by mouth 2 (two) times daily.     hydrALAZINE 25 MG tablet  Commonly known as:  APRESOLINE  Take 0.5 tablets (12.5 mg total) by mouth 2 (two) times daily.     metoprolol tartrate 25 MG tablet  Commonly known as:  LOPRESSOR  Take 25 mg by mouth 2 (two) times daily.     nitroGLYCERIN 0.4 MG SL tablet  Commonly known as:  NITROSTAT  Place 0.4 mg under the tongue every 5 (five) minutes as needed for chest pain.     omeprazole 20 MG capsule  Commonly known as:  PRILOSEC  Take 20 mg by mouth daily.     traMADol 50 MG tablet  Commonly known as:  ULTRAM  Take 1 tablet (50 mg total) by mouth 2 (two) times daily as needed for moderate pain.     vitamin D (CHOLECALCIFEROL) 400 UNITS tablet  Take 2 tablets (800 Units total) by mouth daily.        Discharge Instructions: Please refer to Patient Instructions section of EMR for full details.  Patient was counseled important signs and symptoms that should prompt return to medical care, changes in medications, dietary instructions, activity restrictions, and follow up appointments.   Follow-Up Appointments: Follow-up Information   Follow up with Perry MountKristy Rocio Acosta, MD On 07/13/2014. (@ 9:30AM)    Contact information:   [address], [place], [place] [address] [phone]      Kathee DeltonIan D McKeag, MD 07/10/2014, 2:29 PM PGY-1, [facility]

## 2014-07-10 NOTE — Telephone Encounter (Signed)
Spoke with pt caregiver, she reports she took the patient to the hosp yesterday because she was feeling bad. Patient is currently still there.

## 2014-07-10 NOTE — Progress Notes (Signed)
PT Cancellation Note  Patient Details Name: Frances [name] MRN: [id number] DOB: 11-Jan-1929   Cancelled Treatment:    Reason Eval/Treat Not Completed: Other (comment) (pt currently on bedrest and await increased activity order)   Toney Sang Beth 07/10/2014, 7:32 AM Delaney Meigs, PT [phone]

## 2014-07-10 NOTE — Discharge Summary (Signed)
Family Medicine Teaching Service  Discharge Note : Attending Jeff Klani Caridi MD Pager [phone] Inpatient Team Pager:  [phone]  I have reviewed this patient and the patient's chart and have discussed discharge planning with the resident at the time of discharge. I agree with the discharge plan as above.    

## 2014-07-10 NOTE — Evaluation (Signed)
Occupational Therapy Evaluation Patient Details Name: Frances [name] MRN: [id number] DOB: [birth year] Today's Date: 07/10/2014    History of Present Illness Frances [name] is a 80 y.o. female presenting with chest pain and found to have AoCKD . PMH is complex and significant for CKD stage IV, CAD (non-occlusive), DJD, HTN, sinus pause, refuses pacemaker, tachy-brady, refuses PPM/ICD.   Clinical Impression   Pt reports being independent in tub bathing, dressing, toileting and retrieve meals from her kitchen prepared by mobile meals or her personal care worker.  Pt does not use a walking device in her home, she walks holding the furniture.  She does not have safety concerns at home, but is not able to state how she would respond in an emergency. Pt performed at a supervision level today with ADL transfers and standing activities. It is questionable whether pt is safe at home alone, but she does not want assisted living or SNF.  Will follow acutely.    Follow Up Recommendations  SNF;Supervision/Assistance - 24 hour (pt is adamantly declining)    Equipment Recommendations       Recommendations for Other Services       Precautions / Restrictions Precautions Precautions: Fall Restrictions Weight Bearing Restrictions: No      Mobility Bed Mobility Overal bed mobility: Modified Independent             General bed mobility comments: increased time  Transfers Overall transfer level: Needs assistance Equipment used: None Transfers: Sit to/from Stand Sit to Stand: Supervision              Balance     Sitting balance-Leahy Scale: Good       Standing balance-Leahy Scale: Fair                              ADL Overall ADL's : Needs assistance/impaired Eating/Feeding: Independent;Sitting   Grooming: Wash/dry hands;Standing;Supervision/safety   Upper Body Bathing: Set up;Sitting   Lower Body Bathing: Supervison/ safety;Sit to/from stand   Upper Body  Dressing : Set up;Sitting   Lower Body Dressing: Supervision/safety;Sit to/from stand   Toilet Transfer: Supervision/safety;Ambulation;BSC Toilet Transfer Details (indicate cue type and reason): furniture walked to [facility] Toileting- Clothing Manipulation and Hygiene: Supervision/safety;Sit to/from stand       Functional mobility during ADLs: Supervision/safety       Vision                     Perception     Praxis      Pertinent Vitals/Pain Pain Assessment: No/denies pain     Hand Dominance Right   Extremity/Trunk Assessment Upper Extremity Assessment Upper Extremity Assessment: Overall WFL for tasks assessed   Lower Extremity Assessment Lower Extremity Assessment: Defer to PT evaluation   Cervical / Trunk Assessment Cervical / Trunk Assessment: Kyphotic   Communication Communication Communication: Expressive difficulties (no teeth)   Cognition Arousal/Alertness: Awake/alert Behavior During Therapy: WFL for tasks assessed/performed Overall Cognitive Status: No family/caregiver present to determine baseline cognitive functioning Area of Impairment: Safety/judgement;Problem solving         Safety/Judgement: Decreased awareness of safety;Decreased awareness of deficits     General Comments: pt has pull cords x 3 in her home, unable to respond appropriately to safety scenarios   General Comments       Exercises       Shoulder Instructions      Home Living Family/patient expects to be discharged to::  Private residence [facility]([place]? retirement apartments) Living Arrangements: Alone Available Help at Discharge: Personal care attendant (2 house 7 days) Type of Home: Apartment Home Access: Level entry     Home Layout: One level     Bathroom Shower/Tub: Chief Strategy OfficerTub/shower unit   Bathroom Toilet: Standard     Home Equipment: Environmental consultantWalker - 2 wheels;Cane - single point;Bedside commode;Grab bars - tub/shower          Prior Functioning/Environment Level  of Independence: Needs assistance  Gait / Transfers Assistance Needed: pt states she only walks limited distance in house, doesn't go out and doesn't use her DME ADL's / Homemaking Assistance Needed: pt bathes down in tub, dresses and toilets independently per her report        OT Diagnosis: Generalized weakness;Blindness and low vision   OT Problem List: Decreased strength;Decreased activity tolerance;Impaired balance (sitting and/or standing);Decreased safety awareness;Decreased knowledge of use of DME or AE   OT Treatment/Interventions: Self-care/ADL training;DME and/or AE instruction;Therapeutic activities;Patient/family education;Balance training    OT Goals(Current goals can be found in the care plan section) Acute Rehab OT Goals Patient Stated Goal: return home OT Goal Formulation: With patient Time For Goal Achievement: 07/17/14 Potential to Achieve Goals: Good ADL Goals Pt Will Perform Grooming: with modified independence;standing Pt Will Perform Lower Body Bathing: with modified independence;sit to/from stand Pt Will Perform Lower Body Dressing: with modified independence;sit to/from stand Pt Will Transfer to Toilet: with modified independence;ambulating;regular height toilet Additional ADL Goal #1: Pt will respond appropriately to safety scenarios involving fire, illness, safety.  OT Frequency: Min 2X/week   Barriers to D/C: Decreased caregiver support          Co-evaluation              End of Session    Activity Tolerance: Patient tolerated treatment well Patient left: in bed;with call bell/phone within reach;with bed alarm set   Time: 1533-1610 OT Time Calculation (min): 37 min Charges:  OT General Charges $OT Visit: 1 Procedure OT Evaluation $Initial OT Evaluation Tier I: 1 Procedure OT Treatments $Self Care/Home Management : 8-22 mins G-Codes:    Evern BioMayberry, Sofie Schendel Lynn 07/10/2014, 4:22 PM [phone]

## 2014-07-10 NOTE — H&P (Signed)
FMTS Attending Admission Note: Renold Don MD Personal pager:  [phone] FPTS Service Pager:  [phone]  I  have seen and examined this patient, reviewed their chart. I have discussed this patient with the resident. I agree with the resident's findings, assessment and care plan.  Additionally:  - Admitted for CP R/O. - Has ruled out - acute on chronic renal disease.  Patient refuses dialysis or any further interventions. - Plan to DC home today with FU outpt with PCP.  Can recheck creatinine then if desired.      Tobey Grim, MD 07/10/2014 4:33 PM

## 2014-07-10 NOTE — Progress Notes (Signed)
UR complete.  Cass Vandermeulen RN, MSN 

## 2014-07-10 NOTE — Evaluation (Addendum)
Physical Therapy Evaluation Patient Details Name: CLARITA SEACAT MRN: [id number] DOB: 1929/07/08 Today's Date: 07/10/2014   History of Present Illness  Tonya A Elley is a 80 y.o. female presenting with chest pain and found to have AoCKD . PMH is complex and significant for CKD stage IV, CAD (non-occlusive), DJD, HTN, sinus pause, refuses pacemaker, tachy-brady, refuses PPM/ICD.  Clinical Impression  Pt pleasant difficult to understand at times due to lack of dentition. Pt states she has lived alone for a year and wants to return to this. Pt states she does no cooking or housework and only eats 1x/day when caregiver is there and really doesn't move much even in the house. Pt stating very limited function with decreased cognition, balance and mobility, no local family and do not feel pt is safe to live alone given all above and below deficits. Pt not very receptive to the idea of SNF but it is recommended for increased strength, mobility, safety and assist. Pt will benefit from acute therapy to maximize function, strength and safety to decrease burden of care.     Follow Up Recommendations SNF;Supervision for mobility/OOB    Equipment Recommendations  None recommended by PT    Recommendations for Other Services       Precautions / Restrictions Precautions Precautions: Fall Restrictions Weight Bearing Restrictions: No      Mobility  Bed Mobility Overal bed mobility: Modified Independent             General bed mobility comments: increased time  Transfers Overall transfer level: Needs assistance   Transfers: Sit to/from Stand Sit to Stand: Supervision         General transfer comment: cues for hand placement with supervision for safety to prevent fall  Ambulation/Gait Ambulation/Gait assistance: Min guard Ambulation Distance (Feet): 90 Feet Assistive device: None Gait Pattern/deviations: Shuffle;Narrow base of support;Trunk flexed   Gait velocity interpretation:  Below normal speed for age/gender General Gait Details: pt with slow cautious gait, shuffling with trunk flexed and limited tolerance. guarding for safety to prevent fall as pt denied need for DME although feel she would perform better with rW. Cues for posture and gait sequence  Stairs            Wheelchair Mobility    Modified Rankin (Stroke Patients Only)       Balance Overall balance assessment: Needs assistance   Sitting balance-Leahy Scale: Fair       Standing balance-Leahy Scale: Fair                               Pertinent Vitals/Pain Pain Assessment: No/denies pain    Home Living Family/patient expects to be discharged to:: Private residence [facility] Living Arrangements: Alone Available Help at Discharge: Personal care attendant (2hrs/day 7days/wk) Type of Home: Apartment Home Access: Level entry     Home Layout: One level Home Equipment: Environmental consultant - 2 wheels;Cane - single point      Prior Function Level of Independence: Needs assistance   Gait / Transfers Assistance Needed: pt states she only walks limited distance in house, doesn't go out and doesn't use her DME  ADL's / Homemaking Assistance Needed: aide helps with bathing, pt states she dresses herself and does pericare        Hand Dominance        Extremity/Trunk Assessment   Upper Extremity Assessment: Generalized weakness  Lower Extremity Assessment: Generalized weakness (bil LE 3/5 hip flexion, knee flexion and extension)      Cervical / Trunk Assessment: Kyphotic  Communication   Communication: Expressive difficulties (no teeth, maintains tongue out)  Cognition Arousal/Alertness: Awake/alert   Overall Cognitive Status: Impaired/Different from baseline Area of Impairment: Safety/judgement;Problem solving     Memory: Decreased short-term memory   Safety/Judgement: Decreased awareness of safety;Decreased awareness of deficits   Problem Solving:  Slow processing General Comments: pt unable to state how to call for assist with fire    General Comments      Exercises        Assessment/Plan    PT Assessment Patient needs continued PT services  PT Diagnosis Difficulty walking;Generalized weakness   PT Problem List Decreased strength;Decreased cognition;Decreased activity tolerance;Decreased knowledge of use of DME;Decreased balance  PT Treatment Interventions Gait training;DME instruction;Balance training;Functional mobility training;Patient/family education;Therapeutic activities;Therapeutic exercise   PT Goals (Current goals can be found in the Care Plan section) Acute Rehab PT Goals Patient Stated Goal: return home PT Goal Formulation: With patient Time For Goal Achievement: 07/24/14 Potential to Achieve Goals: Fair    Frequency Min 3X/week   Barriers to discharge Decreased caregiver support      Co-evaluation               End of Session   Activity Tolerance: Patient tolerated treatment well Patient left: in chair;with call bell/phone within reach;with chair alarm set Nurse Communication: Mobility status         Time: [id number] PT Time Calculation (min): 13 min   Charges:   PT Evaluation $Initial PT Evaluation Tier I: 1 Procedure PT Treatments $Gait Training: 8-22 mins   PT G CodesDelorse Lek:          Tabor, Brielle Moro Beth 07/10/2014, 9:56 AM Delaney MeigsMaija Tabor Jovanne Riggenbach, PT [phone]

## 2014-07-10 NOTE — Discharge Instructions (Signed)

## 2014-07-10 NOTE — Care Management Note (Signed)
    Page 1 of 2   07/10/2014     5:07:45 PM CARE MANAGEMENT NOTE 07/10/2014  Patient:  Frances [name], Frances [name]   Account Number:  [id number]  Date Initiated:  07/10/2014  Documentation initiated by:  Lorne Skeens  Subjective/Objective Assessment:   Patient was admitted with acute on chronic kidney disease. Patient lives alone, per H&P has a caregiver.     Action/Plan:   Will follow for discharge needs pending PT/OT evals and physician orders.   Anticipated DC Date:  07/10/2014   Anticipated DC Plan:  [place]  In-house referral  Clinical Social Worker      DC Planning Services  CM consult      [facility] Choice  HOME HEALTH   Choice offered to / List presented to:  C-2 HC POA / Guardian        HH arranged  HH-1 RN  [place].   Status of service:  Completed, signed off Medicare Important Message given?   (If response is "NO", the following Medicare IM given date fields will be blank) Date Medicare IM given:   Medicare IM given by:   Date Additional Medicare IM given:   Additional Medicare IM given by:    Discharge Disposition:  [place]  Per UR Regulation:  Reviewed for med. necessity/level of care/duration of stay  If discussed at [place] of Stay Meetings, dates discussed:    Comments:  07/10/14 Ellan Lambert, RN, BSN [phone] Pt for dc today...PT/OT recommending SNF, but pt adamantly declining.  Met with pt's caregiver, Lorenso Courier (cell [phone]):  she states pt has no family nearby, daughter is deceased and son lives up [place].  Pt has used [facility] for [facility] in the past.  Pt cont to refuse SNF placement. Caregiver states pt is on waiting list for a CAPS worker. Will arrange [facility] services with [facility], per choice; start of care 24-48h post dc date.  Will add CSW to assist with CAPS, community resources/?meals on wheels.

## 2014-07-10 NOTE — Progress Notes (Signed)
Patient discharged to [place] [place] care.  Discharge instructions reviewed and acknowledged by patients caregiver.  RN removed IV with no s/s of infection or drainage.  Patient escorted to discharge by RN in wheelchair.

## 2014-07-10 NOTE — Clinical Social Work Note (Signed)
Referred to this CSW today for ?SNF. Chart reviewed and have spoken with patient who indicates she declines SNF-plans to go home with caregiver. RNCM aware and will f/u for [facility] and DME. CSW to sign off- please contact us if SW needs arise. Reece Levy, MSW, Theresia Majors [phone]

## 2014-07-10 NOTE — Progress Notes (Signed)
Nutrition Brief Note  Patient identified on the Malnutrition Screening Tool (MST) Report.  Per readings below, patient's weight has been stable since January 2015.  Wt Readings from Last 15 Encounters:  07/10/14 125 lb 1.6 oz (56.745 kg)  05/21/14 125 lb (56.7 kg)  04/03/14 125 lb (56.7 kg)  02/26/14 122 lb 6.4 oz (55.52 kg)  11/01/13 122 lb (55.339 kg)  10/23/13 126 lb (57.153 kg)  07/24/13 126 lb (57.153 kg)  06/20/13 129 lb 4.8 oz (58.65 kg)  05/16/13 130 lb (58.968 kg)  04/17/13 130 lb (58.968 kg)  02/06/13 131 lb (59.421 kg)  01/22/13 134 lb 14.7 oz (61.2 kg)  01/11/13 135 lb (61.236 kg)  10/17/12 124 lb (56.246 kg)  10/13/12 123 lb 3.8 oz (55.9 kg)    Body mass index is 24.43 kg/(m^2). Patient meets criteria for Normal based on current BMI.   Current diet order is Clear Liquids, patient's average meal consumption is approximately 75% at this time. Labs and medications reviewed.   No nutrition interventions warranted at this time. If nutrition issues arise, please consult RD.   Maureen Chatters, RD, LDN Pager #: [phone] After-Hours Pager #: [phone]

## 2014-07-11 ENCOUNTER — Telehealth: Payer: Self-pay | Admitting: Family Medicine

## 2014-07-11 ENCOUNTER — Telehealth: Payer: Self-pay | Admitting: Cardiology

## 2014-07-11 NOTE — Telephone Encounter (Signed)
Spoke to [place] ([employer]) She wanted to inform the office that patient was in hospital from 06/29/14 to 07/10/14. She also notice patient was not on an ACE and ARB - HAD AN EF 20%. SHE WANTED TO KNOW OF THE REASON - REVIEWED LAST OFFICE NOTE FROM 8 /2014- PATIENT IS NOT ON MEDICATION BECAUSE OF  PATIENT'S RENAL FUNCTIONS. STEPHANIE THANKED RN  FOR INFORMATIONS.

## 2014-07-11 NOTE — Telephone Encounter (Signed)
Requesting to speak with RN to give an update on pt, also would like to verify meds with someone.

## 2014-07-12 NOTE — Telephone Encounter (Signed)
LMOVM of Frances [name] (ext [phone]) for callback. Frances [name], Frances [name]

## 2014-07-13 ENCOUNTER — Inpatient Hospital Stay: Payer: Medicaid Other | Admitting: Family Medicine

## 2014-07-16 ENCOUNTER — Ambulatory Visit (INDEPENDENT_AMBULATORY_CARE_PROVIDER_SITE_OTHER): Payer: Medicare Other | Admitting: Family Medicine

## 2014-07-16 ENCOUNTER — Encounter: Payer: Self-pay | Admitting: Family Medicine

## 2014-07-16 VITALS — BP 196/81 | HR 80 | Temp 97.9°F | Wt 127.0 lb

## 2014-07-16 DIAGNOSIS — R0789 Other chest pain: Secondary | ICD-10-CM

## 2014-07-16 DIAGNOSIS — Z515 Encounter for palliative care: Secondary | ICD-10-CM | POA: Insufficient documentation

## 2014-07-16 NOTE — Progress Notes (Signed)
   Subjective:    Patient ID: Frances [name], female    DOB: 10/12/1928, 80 y.o.   MRN: [id number]  HPI  Hospital Follow up for admit for Chest Pain No furhter chest pain or shortness of breath.  Occasionally feels dizzy both at rest and when standing.  No falls.   Does not ever want to go back to hospital   Renal failure Complains of decreased appetite.  Edema is stable.  Does not wish dialysis   HYPERTENSION Disease Monitoring Home BP Monitoring not checking Chest pain- no    Dyspnea- no Medications Compliance-  Has all medications  Aide says sometimes does not take. Lightheadedness-  As above  Edema- mild ROS - See HPI  PMH Lab Review   Potassium  Date Value Ref Range Status  07/10/2014 5.3  3.7 - 5.3 mEq/L Final     Sodium  Date Value Ref Range Status  07/10/2014 139  137 - 147 mEq/L Final  08/23/2013 144  137 - 147 mmol/L Final     Creat  Date Value Ref Range Status  10/17/2012 6.30* 0.50 - 1.10 mg/dL Final     Creatinine  Date Value Ref Range Status  08/23/2013 2.9* 0.5 - 1.1 mg/dL Final     Creatinine, Ser  Date Value Ref Range Status  07/10/2014 3.72* 0.50 - 1.10 mg/dL Final       Chief Complaint noted Review of Symptoms - see HPI PMH - Smoking status noted.   Vital Signs reviewed     Review of Systems     Objective:   Physical Exam  Walking by herself with cane  NAD  Heart - Regular rate and rhythm.  No murmurs, gallops or rubs.    Lungs:  Normal respiratory effort, chest expands symmetrically. Lungs are clear to auscultation, no crackles or wheezes. Extrem - 1 + edema Moving all extrem equally Eye - Pupils Equal Round Reactive to light, Extraocular movements intact, Fundi without hemorrhage or visible lesions, Conjunctiva without redness or discharge      Assessment & Plan:

## 2014-07-16 NOTE — Patient Instructions (Signed)
Good to see you today!  Thanks for coming in.  As per your wishes we will not send you to the hospital or try to resuscitate you but keep you as comfortable as possible  If you are in pain or having problems call my direct number [phone].    Fill out the MOST form and keep it with you at home  You can take the tramadol 3-4 times a day as needed for pain  Come back and see me in 3 months

## 2014-07-16 NOTE — Assessment & Plan Note (Signed)
Gave advanced directives today.  Will need to have MOST form completed next visit

## 2014-07-16 NOTE — Assessment & Plan Note (Signed)
Seems to have resolved.  Might be ischemic but patient does not wish any medical interventions

## 2014-08-06 ENCOUNTER — Ambulatory Visit (INDEPENDENT_AMBULATORY_CARE_PROVIDER_SITE_OTHER): Payer: Medicare Other | Admitting: Podiatry

## 2014-08-06 ENCOUNTER — Encounter: Payer: Self-pay | Admitting: Podiatry

## 2014-08-06 DIAGNOSIS — M79676 Pain in unspecified toe(s): Secondary | ICD-10-CM

## 2014-08-06 DIAGNOSIS — B351 Tinea unguium: Secondary | ICD-10-CM

## 2014-08-06 NOTE — Progress Notes (Signed)
Patient ID: Frances [name], female   DOB: 1928/10/07, 80 y.o.   MRN: [id number]   Subjective: This patient presents again complaining of painful toenails and plantar keratoses  Objective: The toenails are elongated, hypertrophic, incurvated 6-10 Plantar keratoses sub-fifth MPJ bilaterally  Assessment: Symptomatic onychomycoses 6-10 Plantar keratoses 2  Plan: Debrided toenails 10 keratoses 2 without a bleeding  Reappoint 3 months

## 2014-08-08 ENCOUNTER — Telehealth: Payer: Self-pay | Admitting: Family Medicine

## 2014-08-08 NOTE — Telephone Encounter (Signed)
Ms Frances [name] called back at 67.  Ms Frances [name] is sleeping easily.  Ms Frances [name] does not want to go to ER.    She gave her 2 tramadol.  Asked her not to give her asa but to give her and extra omeprazole  She will check on her again at 1230.   Asked her to call me if any questions

## 2014-08-08 NOTE — Telephone Encounter (Signed)
Call from [facility] care aide that Ms Frances [name] is having rectal bleeding and complains of stomach pain - moderate.   Is awake and alert  Advised her to discuss with Ms Hauptman if she would like to go to hospital.  If not to take up to two tramadol for pain and for Ms Zachery Dauer to call me back in 30 minutes when she has to go to another job

## 2014-08-08 NOTE — Telephone Encounter (Signed)
Ms Frances [name] called back she had checked on her.  Feeling about the same a little dizzy able to eat a little Still does not want to go to ER.  Recommend continue to monitor

## 2014-08-09 ENCOUNTER — Inpatient Hospital Stay (HOSPITAL_COMMUNITY)
Admission: EM | Admit: 2014-08-09 | Discharge: 2014-08-13 | DRG: 378 | Disposition: A | Discharge status: Home Health | Payer: Medicare Other | Attending: Family Medicine | Admitting: Family Medicine

## 2014-08-09 ENCOUNTER — Encounter (HOSPITAL_COMMUNITY): Payer: Self-pay | Admitting: Emergency Medicine

## 2014-08-09 ENCOUNTER — Emergency Department (HOSPITAL_COMMUNITY): Payer: Medicare Other

## 2014-08-09 DIAGNOSIS — I495 Sick sinus syndrome: Secondary | ICD-10-CM | POA: Diagnosis present

## 2014-08-09 DIAGNOSIS — E440 Moderate protein-calorie malnutrition: Secondary | ICD-10-CM | POA: Diagnosis present

## 2014-08-09 DIAGNOSIS — I2510 Atherosclerotic heart disease of native coronary artery without angina pectoris: Secondary | ICD-10-CM | POA: Diagnosis present

## 2014-08-09 DIAGNOSIS — I5022 Chronic systolic (congestive) heart failure: Secondary | ICD-10-CM | POA: Diagnosis present

## 2014-08-09 DIAGNOSIS — E875 Hyperkalemia: Secondary | ICD-10-CM | POA: Diagnosis present

## 2014-08-09 DIAGNOSIS — N184 Chronic kidney disease, stage 4 (severe): Secondary | ICD-10-CM | POA: Diagnosis present

## 2014-08-09 DIAGNOSIS — R630 Anorexia: Secondary | ICD-10-CM | POA: Insufficient documentation

## 2014-08-09 DIAGNOSIS — R680 Hypothermia, not associated with low environmental temperature: Secondary | ICD-10-CM | POA: Diagnosis present

## 2014-08-09 DIAGNOSIS — R079 Chest pain, unspecified: Secondary | ICD-10-CM | POA: Diagnosis present

## 2014-08-09 DIAGNOSIS — K921 Melena: Secondary | ICD-10-CM | POA: Diagnosis present

## 2014-08-09 DIAGNOSIS — Z87891 Personal history of nicotine dependence: Secondary | ICD-10-CM | POA: Diagnosis not present

## 2014-08-09 DIAGNOSIS — Z66 Do not resuscitate: Secondary | ICD-10-CM | POA: Diagnosis present

## 2014-08-09 DIAGNOSIS — I429 Cardiomyopathy, unspecified: Secondary | ICD-10-CM | POA: Diagnosis present

## 2014-08-09 DIAGNOSIS — E869 Volume depletion, unspecified: Secondary | ICD-10-CM | POA: Diagnosis present

## 2014-08-09 DIAGNOSIS — E785 Hyperlipidemia, unspecified: Secondary | ICD-10-CM | POA: Diagnosis present

## 2014-08-09 DIAGNOSIS — Z515 Encounter for palliative care: Secondary | ICD-10-CM | POA: Diagnosis not present

## 2014-08-09 DIAGNOSIS — K922 Gastrointestinal hemorrhage, unspecified: Secondary | ICD-10-CM

## 2014-08-09 DIAGNOSIS — F0390 Unspecified dementia without behavioral disturbance: Secondary | ICD-10-CM | POA: Diagnosis present

## 2014-08-09 DIAGNOSIS — Z6821 Body mass index (BMI) 21.0-21.9, adult: Secondary | ICD-10-CM | POA: Diagnosis not present

## 2014-08-09 DIAGNOSIS — I129 Hypertensive chronic kidney disease with stage 1 through stage 4 chronic kidney disease, or unspecified chronic kidney disease: Secondary | ICD-10-CM | POA: Diagnosis present

## 2014-08-09 DIAGNOSIS — I10 Essential (primary) hypertension: Secondary | ICD-10-CM | POA: Insufficient documentation

## 2014-08-09 DIAGNOSIS — N189 Chronic kidney disease, unspecified: Secondary | ICD-10-CM | POA: Insufficient documentation

## 2014-08-09 HISTORY — DX: Gastrointestinal hemorrhage, unspecified: K92.2

## 2014-08-09 LAB — CBC
HCT: 29.5 % — ABNORMAL LOW (ref 36.0–46.0)
Hemoglobin: 9.3 g/dL — ABNORMAL LOW (ref 12.0–15.0)
MCH: 27.7 pg (ref 26.0–34.0)
MCHC: 31.5 g/dL (ref 30.0–36.0)
MCV: 87.8 fL (ref 78.0–100.0)
Platelets: 139 10*3/uL — ABNORMAL LOW (ref 150–400)
RBC: 3.36 MIL/uL — ABNORMAL LOW (ref 3.87–5.11)
RDW: 13.2 % (ref 11.5–15.5)
WBC: 4.1 10*3/uL (ref 4.0–10.5)

## 2014-08-09 LAB — CBG MONITORING, ED: Glucose-Capillary: 102 mg/dL — ABNORMAL HIGH (ref 70–99)

## 2014-08-09 LAB — COMPREHENSIVE METABOLIC PANEL
ALBUMIN: 3.4 g/dL — AB (ref 3.5–5.2)
ALT: 12 U/L (ref 0–35)
ANION GAP: 18 — AB (ref 5–15)
AST: 12 U/L (ref 0–37)
Alkaline Phosphatase: 83 U/L (ref 39–117)
BILIRUBIN TOTAL: 0.4 mg/dL (ref 0.3–1.2)
BUN: 88 mg/dL — AB (ref 6–23)
CHLORIDE: 106 meq/L (ref 96–112)
CO2: 14 mEq/L — ABNORMAL LOW (ref 19–32)
CREATININE: 4.4 mg/dL — AB (ref 0.50–1.10)
Calcium: 8.6 mg/dL (ref 8.4–10.5)
GFR calc Af Amer: 10 mL/min — ABNORMAL LOW (ref >90)
GFR, EST NON AFRICAN AMERICAN: 8 mL/min — AB (ref >90)
GLUCOSE: 96 mg/dL (ref 70–99)
Potassium: 5.5 mEq/L — ABNORMAL HIGH (ref 3.7–5.3)
Sodium: 138 mEq/L (ref 137–147)
Total Protein: 7 g/dL (ref 6.0–8.3)

## 2014-08-09 LAB — POC OCCULT BLOOD, ED: FECAL OCCULT BLD: POSITIVE — AB

## 2014-08-09 LAB — ABO/RH: ABO/RH(D): B NEG

## 2014-08-09 LAB — PROTIME-INR
INR: 1.1 (ref 0.00–1.49)
PROTHROMBIN TIME: 14.3 s (ref 11.6–15.2)

## 2014-08-09 LAB — TROPONIN I

## 2014-08-09 LAB — I-STAT CG4 LACTIC ACID, ED: LACTIC ACID, VENOUS: 1.18 mmol/L (ref 0.5–2.2)

## 2014-08-09 LAB — PREPARE RBC (CROSSMATCH)

## 2014-08-09 MED ORDER — SODIUM CHLORIDE 0.9 % IV SOLN: Freq: Once | INTRAVENOUS | Status: DC

## 2014-08-09 MED ORDER — TRAMADOL HCL 50 MG PO TABS
50.0000 mg | ORAL_TABLET | Freq: Four times a day (QID) | ORAL | Status: DC | PRN
  Administered 2014-08-09: 50 mg via ORAL
  Filled 2014-08-09: qty 1

## 2014-08-09 MED ORDER — CHOLESTYRAMINE 4 G PO PACK
4.0000 g | PACK | Freq: Two times a day (BID) | ORAL | Status: AC
  Administered 2014-08-09 (×2): 4 g via ORAL
  Filled 2014-08-09 (×2): qty 1

## 2014-08-09 MED ORDER — SODIUM CHLORIDE 0.9 % IV BOLUS (SEPSIS)
500.0000 mL | Freq: Once | INTRAVENOUS | Status: AC
  Administered 2014-08-09: 500 mL via INTRAVENOUS

## 2014-08-09 MED ORDER — ATORVASTATIN CALCIUM 40 MG PO TABS
40.0000 mg | ORAL_TABLET | Freq: Every day | ORAL | Status: DC
  Administered 2014-08-09 – 2014-08-10 (×2): 40 mg via ORAL
  Filled 2014-08-09 (×3): qty 1

## 2014-08-09 MED ORDER — CHOLECALCIFEROL 10 MCG (400 UNIT) PO TABS
800.0000 [IU] | ORAL_TABLET | Freq: Every day | ORAL | Status: DC
  Administered 2014-08-09 – 2014-08-13 (×5): 800 [IU] via ORAL
  Filled 2014-08-09 (×5): qty 2

## 2014-08-09 MED ORDER — SODIUM CHLORIDE 0.9 % IJ SOLN
3.0000 mL | Freq: Two times a day (BID) | INTRAMUSCULAR | Status: DC
  Administered 2014-08-09 – 2014-08-13 (×9): 3 mL via INTRAVENOUS

## 2014-08-09 MED ORDER — ONDANSETRON HCL 4 MG/2ML IJ SOLN: 4.0000 mg | Freq: Four times a day (QID) | INTRAMUSCULAR | Status: DC | PRN

## 2014-08-09 MED ORDER — CINACALCET HCL 30 MG PO TABS
60.0000 mg | ORAL_TABLET | Freq: Every day | ORAL | Status: DC
  Administered 2014-08-09 – 2014-08-13 (×5): 60 mg via ORAL
  Filled 2014-08-09 (×6): qty 2

## 2014-08-09 MED ORDER — PANTOPRAZOLE SODIUM 40 MG IV SOLR
40.0000 mg | Freq: Two times a day (BID) | INTRAVENOUS | Status: DC
  Administered 2014-08-09 – 2014-08-13 (×9): 40 mg via INTRAVENOUS
  Filled 2014-08-09 (×10): qty 40

## 2014-08-09 MED ORDER — ONDANSETRON HCL 4 MG PO TABS: 4.0000 mg | ORAL_TABLET | Freq: Four times a day (QID) | ORAL | Status: DC | PRN

## 2014-08-09 MED ORDER — NITROGLYCERIN 0.4 MG SL SUBL: 0.4000 mg | SUBLINGUAL_TABLET | SUBLINGUAL | Status: DC | PRN

## 2014-08-09 NOTE — ED Notes (Addendum)
Bear Hugger placed on patient at this time due to rectal temp of 95.3. Will re-check rectal temp momentarily.

## 2014-08-09 NOTE — ED Notes (Signed)
MD,Cardiologist, at bedside.

## 2014-08-09 NOTE — ED Notes (Signed)
To ED from home via [facility], home health aid reported pt had CP, gave 1 nitro, report of GI bleed this week, pt is at neuro baseline per home health, weak and diaphoretic on EMS arrival, per EMS, no radial pulse when sitting up  154/98 89

## 2014-08-09 NOTE — Plan of Care (Signed)
Problem: Phase I Progression Outcomes Goal: Pain controlled with appropriate interventions Outcome: Completed/Met Date Met:  08/09/14 Goal: Voiding-avoid urinary catheter unless indicated Outcome: Completed/Met Date Met:  08/09/14

## 2014-08-09 NOTE — Care Management Note (Addendum)
  Page 2 of 2   08/13/2014     3:05:52 PM CARE MANAGEMENT NOTE 08/13/2014  Patient:  Frances [name], Frances [name]   Account Number:  [id number]  Date Initiated:  08/09/2014  Documentation initiated by:  Ariana Cavenaugh  Subjective/Objective Assessment:   Rectal Bleeding, stomach pain, CP, Hypothermia (Rectal temp 95.3)  Hx/o dementia     Action/Plan:   CM to follow for dispositon needs   Anticipated DC Date:  08/10/2014   Anticipated DC Plan:  HOME W HOME HEALTH SERVICES      DC Planning Services  CM consult      [facility] Choice  HOME HEALTH   Choice offered to / List presented to:  C-1 Patient        HH arranged  HH-1 RN  HH-10 DISEASE MANAGEMENT  HH-6 SOCIAL WORKER      HH agency  [facility]   Status of service:  Completed, signed off Medicare Important Message given?  YES (If response is "NO", the following Medicare IM given date fields will be blank) Date Medicare IM given:  08/13/2014 Medicare IM given by:  Nahmir Zeidman Date Additional Medicare IM given:   Additional Medicare IM given by:    Discharge Disposition:  HOME W HOME HEALTH SERVICES  Per UR Regulation:  Reviewed for med. necessity/level of care/duration of stay  If discussed at Long Length of Stay Meetings, dates discussed:   08/14/2014    Comments:  Donato Schultz RN, BSN, MSHL, CCM  Nurse - Case Manager,  (Unit [facility])  [phone]  08/13/2014 Rectal Bleeding, stomach pain, CP, Hypothermia (Rectal temp 95.3).  Hx/o dementia Social:  From home alone.  Hx/o caregiver/PCS worker/[name] Zachery Dauer [phone] with [facility] [phone] (Approximately 2 hours per day). Hx/o approved for CAPPS but waiting to hear back from the state. Patient is eligible for Hospice Services but refuses d/t patient would loose PCS service. CM confirmed with [facility] that patient is eligible for Hospice and CAPPS but NOT Hospice and PCS. HHS:  Hx/o of [facility] Transportation:  PCS will  provide transportation home. Dispo Plan:  Home with HHS: RN/SW (Marie/[facility] notified)   HH order: Disease MGMT Medication MGMT Medication Reconciliation post hospital discharge HSE SW for community resources and assistance with placement if needed within next 30 days post hospital discharge. PCP:  Dr. Gaynell Face L. Chambliss PCS Services covered by MCD provided by : [facility] [phone] PCS assigned:  Nolberto Hanlon [phone] About 2 hours per day

## 2014-08-09 NOTE — H&P (Signed)
Family Medicine Teaching [facility] Admission History and Physical Service Pager: [phone]  Patient name: Frances [name] Medical record number: [id number] Date of birth: 13-Mar-1929 Age: 80 y.o. Gender: female  Primary Care Provider: Carney Living, MD Consultants: Palliative care Code Status:DNR  Chief Complaint: bloody stool  Assessment and Plan: Frances [name] is a 80 y.o. female presenting with GI bleed . PMH is significant for HTN, CKD stage IV, , sick sinus syndrome, CAD,systolic CHF.   GI bleed, abdominal pain, hypothermia - likely lower GI bleed, however history is unclear so will go ahead and treat with twice a day PPI - vital signs stable, admit to telemetry - hypothermia likely due to acute anemia, unclear etiology for abdominal pain but likely related to GI bleed. Her abdominal pain is currently resolved. - Patient is DNR, would not like any intervention such as colonoscopy or EGD. - status post transfusion 1 unit, follow-up posttransfusion H&H - Defer GI consult for now considering that she would not like interventions - Palliative consult considering severe kidney disease, severe heart disease, and now GI bleed which without intervention could possibly lead to her death. - repeatCBC in the a.m. - I discussed this with her PCP with and this DNR is consistent with her previous desires  Chest pain - Patient currently denying - EKG done in the EDwith flipped T waves in leads 3, aVF, V4, V5 more exaggerated from previous exam, and LVH  - likely exaggerated flipped T waves due to anemia - troponin negative 1 - when necessary nitroglycerin  Hypertension - pressure reasonable today - Given GI bleed and likely volume depletion will hold Lasix and metoprolol  CKD stage IV, hyperkalemia, elevated BUNs - patient does not desire to have dialysis - cholestyramine for hyperkalemia 2, repeat BMP in the morning, no peaked T waves on EKG - continue SensiparAnd vitamin  D  CAD, sick sinus syndrome, chronic systolic CHF - Some chest pain as above, however not endorsing it now - continue Lipitor - aspirin held due to current GI bleed, stopped by her PCP a couple of days ago  FEN/GI: renal diet, fluids to KVO Prophylaxis: no heparin due to current GI bleed, SCDs  Disposition: admit to telemetry for close monitoring and labs as necessary.  History of Present Illness: Frances [name] is a 80 y.o. female presenting with GI bleed.the patient explains that it began 2 days ago was a normal bowel movement that appeared bloody. She is very difficult to understand but to the best of my knowledge she denies nausea and vomiting. She states that she had central moderate abdominal pain which is now resolved. She states that she has a good appetite and would like to eat now. She confirms that she is DNR and would not like any interventions to interfere with this GI bleed she may continue, specifically she does not want a colonoscopy or EGD. She states that earlier she felt cold but now she feels warmer.  Review Of Systems: Per HPI, Otherwise 12 point review of systems was performed and was unremarkable.  Patient Active Problem List   Diagnosis Date Noted  . GI bleed 08/09/2014  . End of life care 07/16/2014  . Chest pain 07/10/2014  . Abdominal pain, epigastric 11/01/2013  . Bilateral leg edema 05/21/2013    Class: Chronic  . Non-ischemic cardiomyopathy   . Tachycardia-bradycardia - refuses PPM/ICD 10/21/2012  . Sinus pause, refuses pacemaker/ICD 10/12/2012  . At high risk for falls 11/30/2011  . Cholecystitis 04/29/2011  .  Health care home, active care coordination 04/08/2011  . HYPERTENSION, BENIGN SYSTEMIC - Labile 11/25/2006  . CHRONIC KIDNEY DISEASE STAGE IV (SEVERE) 11/25/2006  . DJD (degenerative joint disease) 11/25/2006  . Coronary artery disease, non-occlusive 05/21/2004   Past Medical History: Past Medical History  Diagnosis Date  . Coronary artery  disease, non-occlusive 2005    Last cath in 2005 - no significant disease; Myoview 2009 no ischemia  . Hypertension - labile   . Renal disorder   . UTI (lower urinary tract infection) 10/10/2012  . Stroke   . CKD (chronic kidney disease), stage IV   . Speech impediment   . Dyslipidemia   . Non-ischemic cardiomyopathy     Echo 09/2012:EF <20% with severe global hypokinesis (down from ~45% in 2012), Aortic Sclerosis.  Trivial pericardial effusion; Pt declined furhter investigation, Myoview if + would lead to cath & she does not want cath.  . Tachycardia-bradycardia     Declines PPM  . Paresthesia     fingers  . Dizziness   . CHF (congestive heart failure)    Past Surgical History: Past Surgical History  Procedure Laterality Date  . Abdominal surgery      midline abdominal scar   . Knee surgery      both knees   . Renal doppler  05/02/2005    Non-specific medical renal disease with bilateral renal cysts. Otherwise no significant abnormality with no obstructive uropathy.  . Cardiac catheterization Left 06/11/2004    Angiographically patent coronary arteries, normal LV end-diastolic pressure.  . Cardiovascular stress test  05/22/2008    Perfusion defect in inferior myocardial region is consistent with diaphragmatic attenuation. Remaining myocardium demonstrates normal myocardial perfusion with no evidence of ischemia of infarct. No ECG changes. EKG negative for ischemia.  . Transthoracic echocardiogram  10/11/2012    EF <20%, moderate concentric hypertrophy, grade 1 diastolic dysfunction, aortic valve sclerodid (not stenosis).   Social History: History  Substance Use Topics  . Smoking status: Former Smoker    Types: Cigarettes    Start date: 05/16/1993  . Smokeless tobacco: Never Used  . Alcohol Use: No   Additional social history: Please also refer to relevant sections of EMR.  Family History: Family History  Problem Relation Age of Onset  . Heart attack Daughter    Allergies  and Medications: Allergies  Allergen Reactions  . Catapres [Clonidine Hcl] Rash   No current facility-administered medications on file prior to encounter.   Current Outpatient Prescriptions on File Prior to Encounter  Medication Sig Dispense Refill  . atorvastatin (LIPITOR) 40 MG tablet Take 40 mg by mouth daily.    . cinacalcet (SENSIPAR) 60 MG tablet Take 60 mg by mouth daily.      . furosemide (LASIX) 20 MG tablet Take 1 tablet (20 mg total) by mouth 2 (two) times daily. 60 tablet 6  . hydrALAZINE (APRESOLINE) 25 MG tablet Take 0.5 tablets (12.5 mg total) by mouth 2 (two) times daily.    . nitroGLYCERIN (NITROSTAT) 0.4 MG SL tablet Place 0.4 mg under the tongue every 5 (five) minutes as needed for chest pain.    Marland Kitchen omeprazole (PRILOSEC) 20 MG capsule Take 20 mg by mouth daily.    . traMADol (ULTRAM) 50 MG tablet Take 50 mg by mouth every 6 (six) hours as needed for moderate pain.    . vitamin D, CHOLECALCIFEROL, 400 UNITS tablet Take 2 tablets (800 Units total) by mouth daily. 60 tablet 1  . metoprolol tartrate (LOPRESSOR) 25  MG tablet Take 25 mg by mouth 2 (two) times daily.      Objective: BP 128/61 mmHg  Pulse 85  Temp(Src) 97.8 F (36.6 C) (Oral)  Resp 15  Ht 5\' 4"  (1.626 m)  Wt 127 lb (57.607 kg)  BMI 21.79 kg/m2  SpO2 99% Exam: Gen: NAD, alert, cooperative with exam HEENT: NCAT, MMM CV: RRR,  no murmur Resp: nonlabored, faint crackles in the right base Abd: SNTND, BS present, no guarding or organomegaly  Ext: No edema, warm Neuro: Alert and oriented, tongue protruding and speaks with a lisp, however has no word finding difficulty, strength 3-4/5 in bilateral lower extremities, EOMI   Labs and Imaging: CBC BMET   Recent Labs Lab 08/09/14 0910  WBC 4.1  HGB 9.3*  HCT 29.5*  PLT 139*    Recent Labs Lab 08/09/14 0910  NA 138  K 5.5*  CL 106  CO2 14*  BUN 88*  CREATININE 4.40*  GLUCOSE 96  CALCIUM 8.6       Recent Labs Lab 08/09/14 0910   TROPONINI <0.30   Lactic acid 1.18  EKG 08/09/2014 with LVH, flipped T waves in leads 2, 3, aVF, V4, V5, V6  Chest x-ray 08/09/2014: No acute abnormalities noted  Elenora GammaSamuel L Cielo Arias, MD 08/09/2014, 1:04 PM PGY-3, [facility] FPTS Intern pager: [phone], text pages welcome

## 2014-08-09 NOTE — ED Provider Notes (Signed)
CSN: [id number]     Arrival date & time 08/09/14  0818 History   First MD Initiated Contact with Patient 08/09/14 [phone]     Chief Complaint  Patient presents with  . Chest Pain    HPI Comments: 80 y.o. Female presents via EMS due to reported chest pain. Caregiver gave 1 nitroglycerin. EMS reports diaphoretic and weak on their arrival. The patient is a limited historian. States that she had lower abdominal pain that is now gone and she feels "fine". Does endorse diaphoresis.   Patient is a 80 y.o. female presenting with abdominal pain. The history is provided by the patient and medical records.  Abdominal Pain Pain location:  Suprapubic Pain quality comment:  Unable to specify Pain radiates to:  Does not radiate Pain severity:  Severe Onset quality:  Unable to specify Timing:  Unable to specify Progression:  Resolved Chronicity:  New Relieved by:  None tried Worsened by:  Nothing tried Ineffective treatments:  None tried Associated symptoms: chest pain and chills   Associated symptoms: no fever   Chest pain:    Quality:  Unable to specify   Severity:  Unable to specify   Timing:  Unable to specify   Progression:  Unable to specify Risk factors: being elderly     Past Medical History  Diagnosis Date  . Coronary artery disease, non-occlusive 2005    Last cath in 2005 - no significant disease; Myoview 2009 no ischemia  . Hypertension - labile   . Renal disorder   . UTI (lower urinary tract infection) 10/10/2012  . Stroke   . CKD (chronic kidney disease), stage IV   . Speech impediment   . Dyslipidemia   . Non-ischemic cardiomyopathy     Echo 09/2012:EF <20% with severe global hypokinesis (down from ~45% in 2012), Aortic Sclerosis.  Trivial pericardial effusion; Pt declined furhter investigation, Myoview if + would lead to cath & she does not want cath.  . Tachycardia-bradycardia     Declines PPM  . Paresthesia     fingers  . Dizziness   . CHF (congestive heart failure)     Past Surgical History  Procedure Laterality Date  . Abdominal surgery      midline abdominal scar   . Knee surgery      both knees   . Renal doppler  05/02/2005    Non-specific medical renal disease with bilateral renal cysts. Otherwise no significant abnormality with no obstructive uropathy.  . Cardiac catheterization Left 06/11/2004    Angiographically patent coronary arteries, normal LV end-diastolic pressure.  . Cardiovascular stress test  05/22/2008    Perfusion defect in inferior myocardial region is consistent with diaphragmatic attenuation. Remaining myocardium demonstrates normal myocardial perfusion with no evidence of ischemia of infarct. No ECG changes. EKG negative for ischemia.  . Transthoracic echocardiogram  10/11/2012    EF <20%, moderate concentric hypertrophy, grade 1 diastolic dysfunction, aortic valve sclerodid (not stenosis).   Family History  Problem Relation Age of Onset  . Heart attack Daughter    History  Substance Use Topics  . Smoking status: Former Smoker    Types: Cigarettes    Start date: 05/16/1993  . Smokeless tobacco: Never Used  . Alcohol Use: No   OB History    No data available     Review of Systems  Constitutional: Positive for chills and diaphoresis. Negative for fever.  Cardiovascular: Positive for chest pain.  Gastrointestinal: Positive for abdominal pain.  All other systems reviewed and are  negative.   Allergies  Catapres  Home Medications   Prior to Admission medications   Medication Sig Start Date End Date Taking? Authorizing Provider  aspirin EC 81 MG tablet Take 81 mg by mouth daily.    Historical Provider, MD  atorvastatin (LIPITOR) 40 MG tablet Take 40 mg by mouth daily.    Historical Provider, MD  cinacalcet (SENSIPAR) 60 MG tablet Take 60 mg by mouth daily.      Historical Provider, MD  furosemide (LASIX) 20 MG tablet Take 1 tablet (20 mg total) by mouth 2 (two) times daily. 12/19/13   Carney LivingMarshall L Chambliss, MD   hydrALAZINE (APRESOLINE) 25 MG tablet Take 0.5 tablets (12.5 mg total) by mouth 2 (two) times daily. 01/23/14   Leighton Roachodd D McDiarmid, MD  metoprolol tartrate (LOPRESSOR) 25 MG tablet Take 25 mg by mouth 2 (two) times daily.    Historical Provider, MD  nitroGLYCERIN (NITROSTAT) 0.4 MG SL tablet Place 0.4 mg under the tongue every 5 (five) minutes as needed for chest pain. 10/13/12   Tommie SamsJayce G Cook, DO  omeprazole (PRILOSEC) 20 MG capsule Take 20 mg by mouth daily.    Historical Provider, MD  traMADol (ULTRAM) 50 MG tablet Take 50 mg by mouth every 6 (six) hours as needed for moderate pain. 04/03/14   Carney LivingMarshall L Chambliss, MD  vitamin D, CHOLECALCIFEROL, 400 UNITS tablet Take 2 tablets (800 Units total) by mouth daily. 05/21/14   Carney LivingMarshall L Chambliss, MD   BP 161/63 mmHg  Pulse 90  Temp(Src) 95.3 F (35.2 C) (Rectal)  Resp 15  Ht 5\' 4"  (1.626 m)  Wt 127 lb (57.607 kg)  BMI 21.79 kg/m2  SpO2 100%   Physical Exam  Constitutional: She appears well-developed and well-nourished. No distress.  Elderly female, appears frail  HENT:  Head: Normocephalic and atraumatic.  Right Ear: External ear normal.  Left Ear: External ear normal.  Mouth/Throat: Oropharynx is clear and moist.  Eyes:  Pale conjunctiva  Neck: Normal range of motion.  Cardiovascular: Normal rate and regular rhythm.   Pulmonary/Chest: Effort normal and breath sounds normal. No respiratory distress. She has no wheezes.  Abdominal: Soft. She exhibits no distension. There is no tenderness. There is no rebound and no guarding.  Neurological: She is alert.  Moves all extremities spontaneously and to command  Skin: Skin is warm and dry. She is not diaphoretic.  No diaphoresis  Psychiatric: She has a normal mood and affect.  Vitals reviewed.   ED Course  Procedures   Labs Review  Results for orders placed or performed during the hospital encounter of 08/09/14  Protime-INR (if patient is taking Coumadin)  Result Value Ref Range    Prothrombin Time 14.3 11.6 - 15.2 seconds   INR 1.10 0.00 - 1.49  Comprehensive metabolic panel  Result Value Ref Range   Sodium 138 137 - 147 mEq/L   Potassium 5.5 (H) 3.7 - 5.3 mEq/L   Chloride 106 96 - 112 mEq/L   CO2 14 (L) 19 - 32 mEq/L   Glucose, Bld 96 70 - 99 mg/dL   BUN 88 (H) 6 - 23 mg/dL   Creatinine, Ser 7.824.40 (H) 0.50 - 1.10 mg/dL   Calcium 8.6 8.4 - 95.610.5 mg/dL   Total Protein 7.0 6.0 - 8.3 g/dL   Albumin 3.4 (L) 3.5 - 5.2 g/dL   AST 12 0 - 37 U/L   ALT 12 0 - 35 U/L   Alkaline Phosphatase 83 39 - 117 U/L  Total Bilirubin 0.4 0.3 - 1.2 mg/dL   GFR calc non Af Amer 8 (L) >90 mL/min   GFR calc Af Amer 10 (L) >90 mL/min   Anion gap 18 (H) 5 - 15  Troponin I  Result Value Ref Range   Troponin I <0.30 <0.30 ng/mL  CBC  Result Value Ref Range   WBC 4.1 4.0 - 10.5 K/uL   RBC 3.36 (L) 3.87 - 5.11 MIL/uL   Hemoglobin 9.3 (L) 12.0 - 15.0 g/dL   HCT 04.5 (L) 40.9 - 81.1 %   MCV 87.8 78.0 - 100.0 fL   MCH 27.7 26.0 - 34.0 pg   MCHC 31.5 30.0 - 36.0 g/dL   RDW 91.4 78.2 - 95.6 %   Platelets 139 (L) 150 - 400 K/uL  CBG monitoring, ED  Result Value Ref Range   Glucose-Capillary 102 (H) 70 - 99 mg/dL  I-Stat CG4 Lactic Acid, ED  Result Value Ref Range   Lactic Acid, Venous 1.18 0.5 - 2.2 mmol/L  POC occult blood, ED  Result Value Ref Range   Fecal Occult Bld POSITIVE (A) NEGATIVE  Type and screen for Red Blood Exchange  Result Value Ref Range   ABO/RH(D) B NEG    Antibody Screen NEG    Sample Expiration 08/12/2014   Prepare RBC  Result Value Ref Range   Order Confirmation ORDER PROCESSED BY BLOOD BANK     Imaging Review Dg Chest Portable 1 View  08/09/2014   CLINICAL DATA:  Chest pain  EXAM: PORTABLE CHEST - 1 VIEW  COMPARISON:  07/09/2014  FINDINGS: Cardiac shadow is mildly enlarged but stable. The lungs are well aerated bilaterally. Some chronic interstitial changes are again seen. No focal confluent infiltrate is noted. Aortic calcifications are again seen. No  acute bony abnormality is noted. Old rib fractures are again noted bilaterally.  IMPRESSION: No acute abnormality noted.   Electronically Signed   By: Alcide Clever M.D.   On: 08/09/2014 09:44     EKG Interpretation   Date/Time:  Thursday August 09 2014 08:25:26 EST Ventricular Rate:  90 PR Interval:  189 QRS Duration: 105 QT Interval:  369 QTC Calculation: 451 R Axis:   42 Text Interpretation:  Sinus rhythm Left atrial enlargement LVH with  secondary repolarization abnormality ST depression in Lateral leads  Confirmed by ZACKOWSKI  MD, SCOTT ([id number]) on 08/09/2014 8:34:57 AM      MDM   Final diagnoses:  Chest pain  Gastrointestinal hemorrhage with melena    80 y.o. female presents with reports of chest pain. History extremely limited as the patient states that she is "fine". She reports having bloody stool that began yesterday. She denies chest pain. Reports lower abdominal pain that is now resolved.   Exam as above. Bair hugger placed. Will check urine and CXR for potential source of infection, however other vitals are within normal limits. She is maintaining a normal oxygen saturation on room air.   Hemoccult positive. Hemoglobin dropped 2 points in one month, presumed to be from active GI bleeding. Given the setting of reported chest pain with diaphoresis and mild ST depression in the lateral leads, will transfuse the patient with PRBCs.  Lactic acid reassuring. Troponin negative, will need to be trended. CMP notable for mild hyperkalemia.  Family practice consulted for admission given her GI bleed and chest pain. Urinalysis pending at the time of admission.   She remained hemodynamically acceptable in the ED and was admitted.   This case managed  in conjunction with my attending, Dr. Deretha Emory.     Maxine Glenn, MD 08/09/14 [phone]

## 2014-08-09 NOTE — ED Notes (Signed)
CBG 102 

## 2014-08-09 NOTE — ED Notes (Signed)
Stool card positive per mini lab staff

## 2014-08-09 NOTE — ED Provider Notes (Signed)
I saw and evaluated the patient, reviewed the resident's note and I agree with the findings and plan.   EKG Interpretation   Date/Time:  Thursday August 09 2014 08:25:26 EST Ventricular Rate:  90 PR Interval:  189 QRS Duration: 105 QT Interval:  369 QTC Calculation: 451 R Axis:   42 Text Interpretation:  Sinus rhythm Left atrial enlargement LVH with  secondary repolarization abnormality ST depression in Lateral leads  Confirmed by Jachai Okazaki  MD, [place] Mort [phone]) on 08/09/2014 8:34:57 AM      Results for orders placed or performed during the hospital encounter of 08/09/14  Protime-INR (if patient is taking Coumadin)  Result Value Ref Range   Prothrombin Time 14.3 11.6 - 15.2 seconds   INR 1.10 0.00 - 1.49  Comprehensive metabolic panel  Result Value Ref Range   Sodium 138 137 - 147 mEq/L   Potassium 5.5 (H) 3.7 - 5.3 mEq/L   Chloride 106 96 - 112 mEq/L   CO2 14 (L) 19 - 32 mEq/L   Glucose, Bld 96 70 - 99 mg/dL   BUN 88 (H) 6 - 23 mg/dL   Creatinine, Ser 0.93 (H) 0.50 - 1.10 mg/dL   Calcium 8.6 8.4 - 23.5 mg/dL   Total Protein 7.0 6.0 - 8.3 g/dL   Albumin 3.4 (L) 3.5 - 5.2 g/dL   AST 12 0 - 37 U/L   ALT 12 0 - 35 U/L   Alkaline Phosphatase 83 39 - 117 U/L   Total Bilirubin 0.4 0.3 - 1.2 mg/dL   GFR calc non Af Amer 8 (L) >90 mL/min   GFR calc Af Amer 10 (L) >90 mL/min   Anion gap 18 (H) 5 - 15  Troponin I  Result Value Ref Range   Troponin I <0.30 <0.30 ng/mL  CBC  Result Value Ref Range   WBC 4.1 4.0 - 10.5 K/uL   RBC 3.36 (L) 3.87 - 5.11 MIL/uL   Hemoglobin 9.3 (L) 12.0 - 15.0 g/dL   HCT 57.3 (L) 22.0 - 25.4 %   MCV 87.8 78.0 - 100.0 fL   MCH 27.7 26.0 - 34.0 pg   MCHC 31.5 30.0 - 36.0 g/dL   RDW 27.0 62.3 - 76.2 %   Platelets 139 (L) 150 - 400 K/uL  CBG monitoring, ED  Result Value Ref Range   Glucose-Capillary 102 (H) 70 - 99 mg/dL  I-Stat CG4 Lactic Acid, ED  Result Value Ref Range   Lactic Acid, Venous 1.18 0.5 - 2.2 mmol/L  POC occult blood, ED   Result Value Ref Range   Fecal Occult Bld POSITIVE (A) NEGATIVE  Type and screen for Red Blood Exchange  Result Value Ref Range   ABO/RH(D) B NEG    Antibody Screen NEG    Sample Expiration 08/12/2014    Unit Number [id number]    Blood Component Type RED CELLS,LR    Unit division 00    Status of Unit ISSUED    Transfusion Status OK TO TRANSFUSE    Crossmatch Result Compatible   ABO/Rh  Result Value Ref Range   ABO/RH(D) B NEG   Prepare RBC  Result Value Ref Range   Order Confirmation ORDER PROCESSED BY BLOOD BANK    Dg Chest Portable 1 View  08/09/2014   CLINICAL DATA:  Chest pain  EXAM: PORTABLE CHEST - 1 VIEW  COMPARISON:  07/09/2014  FINDINGS: Cardiac shadow is mildly enlarged but stable. The lungs are well aerated bilaterally. Some chronic interstitial changes are again seen.  No focal confluent infiltrate is noted. Aortic calcifications are again seen. No acute bony abnormality is noted. Old rib fractures are again noted bilaterally.  IMPRESSION: No acute abnormality noted.   Electronically Signed   By: Alcide CleverMark  Lukens M.D.   On: 08/09/2014 09:44    Patient is a DO NOT RESUSCITATE. Patient also with history of dementia. According to caregiver baseline. Patient brought in by EMS with a complaint of chest pain however caregiver talked more about the dark bloody stools starting yesterday. Patient's hemoglobin is still above 8 however has dropped significantly since mid October. Blood ordered. Stool was Hemoccult positive. Chest pain workup in process without any acute findings. Patient was also hypothermic. However no evidence of sepsis. Lactic acid was less than 2. Patient's troponin was also negative. EKG had no acute changes. But did have evidence of ST segment depression out laterally. Blood ordered for transfusion. Patient will require admission. Patient placed on fair hugger for low temperature. Patient without any hypotension. Patient not tachycardic.   On exam patient's  alertLungs are clear. Heart regular rate and rhythm. Abdomen flat nontender.  CRITICAL CARE Performed by: Vanetta MuldersZACKOWSKI,Jontue Crumpacker Total critical care time: 30 Critical care time was exclusive of separately billable procedures and treating other patients. Critical care was necessary to treat or prevent imminent or life-threatening deterioration. Critical care was time spent personally by me on the following activities: development of treatment plan with patient and/or surrogate as well as nursing, discussions with consultants, evaluation of patient's response to treatment, examination of patient, obtaining history from patient or surrogate, ordering and performing treatments and interventions, ordering and review of laboratory studies, ordering and review of radiographic studies, pulse oximetry and re-evaluation of patient's condition.   Vanetta MuldersScott Hurschel Paynter, MD 08/09/14 1108

## 2014-08-09 NOTE — ED Notes (Signed)
Bear hugger removed;warm blanket given. Nurse aware.

## 2014-08-09 NOTE — ED Notes (Signed)
IV team to bedside. 

## 2014-08-10 DIAGNOSIS — N184 Chronic kidney disease, stage 4 (severe): Secondary | ICD-10-CM

## 2014-08-10 DIAGNOSIS — N189 Chronic kidney disease, unspecified: Secondary | ICD-10-CM | POA: Insufficient documentation

## 2014-08-10 DIAGNOSIS — Z515 Encounter for palliative care: Secondary | ICD-10-CM

## 2014-08-10 LAB — CBC
HCT: 30 % — ABNORMAL LOW (ref 36.0–46.0)
Hemoglobin: 9.7 g/dL — ABNORMAL LOW (ref 12.0–15.0)
MCH: 28.2 pg (ref 26.0–34.0)
MCHC: 32.3 g/dL (ref 30.0–36.0)
MCV: 87.2 fL (ref 78.0–100.0)
Platelets: 128 10*3/uL — ABNORMAL LOW (ref 150–400)
RBC: 3.44 MIL/uL — ABNORMAL LOW (ref 3.87–5.11)
RDW: 13.7 % (ref 11.5–15.5)
WBC: 7.3 10*3/uL (ref 4.0–10.5)

## 2014-08-10 LAB — TYPE AND SCREEN
ABO/RH(D): B NEG
Antibody Screen: NEGATIVE
Unit division: 0

## 2014-08-10 LAB — BASIC METABOLIC PANEL
Anion gap: 16 — ABNORMAL HIGH (ref 5–15)
BUN: 81 mg/dL — ABNORMAL HIGH (ref 6–23)
CHLORIDE: 111 meq/L (ref 96–112)
CO2: 14 mEq/L — ABNORMAL LOW (ref 19–32)
Calcium: 8.4 mg/dL (ref 8.4–10.5)
Creatinine, Ser: 4.05 mg/dL — ABNORMAL HIGH (ref 0.50–1.10)
GFR, EST AFRICAN AMERICAN: 11 mL/min — AB (ref >90)
GFR, EST NON AFRICAN AMERICAN: 9 mL/min — AB (ref >90)
GLUCOSE: 97 mg/dL (ref 70–99)
POTASSIUM: 5 meq/L (ref 3.7–5.3)
SODIUM: 141 meq/L (ref 137–147)

## 2014-08-10 LAB — URINE MICROSCOPIC-ADD ON

## 2014-08-10 LAB — URINALYSIS, ROUTINE W REFLEX MICROSCOPIC
Bilirubin Urine: NEGATIVE
Glucose, UA: NEGATIVE mg/dL
Hgb urine dipstick: NEGATIVE
KETONES UR: NEGATIVE mg/dL
Nitrite: NEGATIVE
PROTEIN: NEGATIVE mg/dL
Specific Gravity, Urine: 1.011 (ref 1.005–1.030)
UROBILINOGEN UA: 0.2 mg/dL (ref 0.0–1.0)
pH: 5 (ref 5.0–8.0)

## 2014-08-10 MED ORDER — ENSURE COMPLETE PO LIQD
237.0000 mL | Freq: Three times a day (TID) | ORAL | Status: DC
  Administered 2014-08-10 – 2014-08-13 (×11): 237 mL via ORAL
  Filled 2014-08-10: qty 237

## 2014-08-10 MED ORDER — POLYETHYLENE GLYCOL 3350 17 G PO PACK
17.0000 g | PACK | Freq: Every day | ORAL | Status: DC
  Administered 2014-08-10 – 2014-08-13 (×4): 17 g via ORAL
  Filled 2014-08-10 (×4): qty 1

## 2014-08-10 NOTE — Progress Notes (Signed)
Family Medicine Teaching Service Daily Progress Note Intern Pager: [phone]  Patient name: Frances [name] Medical record number: [id number] Date of birth: 05-29-1929 Age: 80 y.o. Gender: female  Primary Care Provider: Carney Living, MD Consultants: Palliative care Code Status: DNR  Pt Overview and Major Events to Date:  11/13: Hematochezia   Assessment and Plan: Frances [name] is a 80 y.o. female presenting with GI bleed . PMH is significant for HTN, CKD stage IV, , sick sinus syndrome, CAD,systolic CHF.   #GI bleed, abdominal pain, hypothermia: likely lower GI bleed given presentation, however history is unclear so will go ahead and treat with PPI BID - vital signs stable - hypothermia likely due to acute anemia as it has resolved, although patient still subjectively cold.  - Unclear etiology for abdominal pain but likely related to GI bleed: currently denies abdominal pain is currently resolved. - Patient is DNR, would not like any intervention such as colonoscopy or EGD. - status post transfusion 1 unit, follow-up posttransfusion hemoglobin from 9.3 > 9.7 - Defer GI consult for now considering that she would not like interventions - F/u palliative consult considering severe kidney disease, severe heart disease, and now GI bleed which without intervention could possibly lead to her death: hopefully she will qualify for hospice.  - repeat CBC in the a.m. -Per discussion with pt's PCP on admission, this DNR is consistent with her previous desires  #Chest pain: Resolved  - EKG done in the ED with flipped T waves in leads 3, aVF, V4, V5 more exaggerated from previous exam, and LVH  - likely exaggerated flipped T waves due to anemia - PRN nitroglycerin  #Hypertension: Blood pressures stable and within normal limits.  - Given GI bleed and likely volume depletion will hold Lasix and metoprolol  #CKD stage IV, hyperkalemia, elevated BUNs: Creatine 4.05 this AM - patient does not  desire to have dialysis - cholestyramine for hyperkalemia 2: K went from 5.5 > 5.0 - continue Sensipar and vitamin D  #CAD, sick sinus syndrome, chronic systolic CHF - Some chest pain as above, however not endorsing it now - Some episodes of sinus tachy up to 140s on telemetry overnight. Currently NSR,. - continue Lipitor - aspirin held due to current GI bleed, stopped by her PCP a couple of days ago  FEN/GI: renal diet, fluids to [facility] Prophylaxis: no heparin due to current GI bleed, SCDs  Disposition: Pending palliative recs, hopefully D/C with hospice   Subjective:  Difficult to understand. Patient denies chest pain, abdominal pain, or SOB. States she hasn't has a BM overnight. Doesn't eat much in the AM.  Objective: Temp:  [95.3 F (35.2 C)-98.3 F (36.8 C)] 98.3 F (36.8 C) (11/13 0619) Pulse Rate:  [80-98] 93 (11/13 0619) Resp:  [11-18] 18 (11/13 0619) BP: (111-161)/(47-71) 125/50 mmHg (11/13 0619) SpO2:  [97 %-100 %] 100 % (11/13 0619) Weight:  [121 lb 14.6 oz (55.3 kg)-127 lb (57.607 kg)] 124 lb 12.5 oz (56.6 kg) (11/13 9242) Physical Exam: General: Lying in bed with multiple blankets in NAD. Her tongue remains protruded out of her mouth slightly as she talks Cardiovascular: RRR, no m/r/g noted 1+ DP and radial pulses Respiratory: No increased WOB. Faint crackles at the right base. No wheezing or rhonchi  Abdomen: +BS, soft, non-tended, non-distended. Multiple well healed scars  Extremities: No edema noted.  Laboratory:  Recent Labs Lab 08/09/14 0910 08/10/14 0347  WBC 4.1 7.3  HGB 9.3* 9.7*  HCT 29.5* 30.0*  PLT  139* 128*    Recent Labs Lab 08/09/14 0910 08/10/14 0347  NA 138 141  K 5.5* 5.0  CL 106 111  CO2 14* 14*  BUN 88* 81*  CREATININE 4.40* 4.05*  CALCIUM 8.6 8.4  PROT 7.0  --   BILITOT 0.4  --   ALKPHOS 83  --   ALT 12  --   AST 12  --   GLUCOSE 96 97   Lactic acid 1.18  Imaging/Diagnostic Tests: EKG 08/09/2014 with LVH, flipped T  waves in leads 2, 3, aVF, V4, V5, V6  Chest x-ray 08/09/2014: No acute abnormalities noted  Frances Puffrystal S Dariann Huckaba, MD 08/10/2014, 7:15 AM PGY-1, [facility] FPTS Intern pager: [phone], text pages welcome

## 2014-08-10 NOTE — Plan of Care (Signed)
Problem: Phase I Progression Outcomes Goal: OOB as tolerated unless otherwise ordered Outcome: Completed/Met Date Met:  08/10/14  Problem: Phase II Progression Outcomes Goal: Tolerating diet Outcome: Completed/Met Date Met:  08/10/14

## 2014-08-10 NOTE — Clinical Documentation Improvement (Signed)
Presents with GIB, CKD4, Chronic Systolic CHF, Anemia documented.   H&H on presentation was 9.3 / 29.5  Transfused 1u PRBC's  Post draw = 9.7 / 30  Please clarify the type of anemia the patient has and document findings in next progress note and include in discharge summary.  Acute Blood Loss Anemia Acute on chronic blood loss anemia Chronic Blood Loss Anemia Other Condition  Thank You, Shellee Milo ,RN Clinical Documentation Specialist:  [phone]  [facility]- Health Information Management

## 2014-08-10 NOTE — Progress Notes (Signed)
   08/10/14 0811  Vitals  Temp 97.6 F (36.4 C)  Temp Source Oral  BP (!) 157/63 mmHg  BP Location Right Arm  BP Method Automatic  Patient Position (if appropriate) Lying  Pulse Rate 98  Pulse Rate Source Dinamap  ECG Heart Rate 98  Cardiac Rhythm NSR  Resp 18  Oxygen Therapy  SpO2 100 %  O2 Device Room Air  Pain Assessment  Pain Assessment No/denies pain  Pain Score 0  ECG Intervals  PR interval 0.17  QRS interval 0.07  QT interval 0.31  QTc interval 0.41  Patient alert and responsive. Denies pain and discomfort. Patient does have difficulty speaking related to previous stroke history. Transfers with assistance x 1 to bedside commode. Student nurse has been taking care of patient this shift.

## 2014-08-10 NOTE — Progress Notes (Signed)
Utilization review completed. Tristin Gladman, RN, BSN. 

## 2014-08-10 NOTE — Discharge Summary (Signed)
Family Medicine [facility] Summary  Patient name: Frances [name] Medical record number: [id number] Date of birth: 1929-06-18 Age: 80 y.o. Gender: female Date of Admission: 08/09/2014  Date of Discharge: 08/14/14 Admitting Physician: Uvaldo RisingKyle J Fletke, MD  Primary Care Provider: Carney LivingHAMBLISS,MARSHALL L, MD Consultants: Palliative Care  Indication for Hospitalization: Hematochezia   Discharge Diagnoses/Problem List:  Hematochezia  Hypothermia  Hypertension  CKD, stage IV Hyperkalemia  CAD Sick sinus syndrome  Chronic systolic CHF  Disposition: Home with home health   Discharge Condition: Stable  Brief Hospital Course:  Frances [name] is a 80 y/o with a PMH HTN, CKD stage IV, sick sinus syndrome, CAD, and systolic CHF presenting with hematochezia with associated central abdominal pain. In the ED, her hgb was 9.3 and she was hypothermic to 95.3 rectally therefore she was given 1u pRBCs and placed in bear hugger.  Her repeat Hgb was 9.7 [facility].  She is a DNR and did not wish to have further interventions, specifically GI work up (EGD or colonoscopy) or dialysis for her CKD. She was amenable to lab draws if necessary and kayexalate for hyperkalemia. During the hospitalization, she denied any additional episodes of hematochezia  and did not require additional blood transfusions.  Given her severe kidney disease, severe heart disease, and now the GI bleed without desires for intervention, it was predicted that her life expectancy was less than 6 months. Palliative care was consulted for goals and care and evaluation for hospice placement. Given her comfort goals of care, her Lipitor and metoprolol were discontinued. Unfortunately, it was found that if the patient were to have home hospice, would not continue to have her paid caregiver at home [facility]([name] Barnes). It was noted prior to discharge that she should continue to be in  contact with hospice agencies to provide intermittent palliative care services as an outpatient.   Issues for Follow Up:  -- Continue to assess patient's symptoms and treat symptomatically  Significant Procedures: None  Significant Labs and Imaging:   Recent Labs Lab 08/09/14 0910 08/10/14 0347 08/11/14 0440  WBC 4.1 7.3 5.4  HGB 9.3* 9.7* 9.5*  HCT 29.5* 30.0* 30.2*  PLT 139* 128* 130*    Recent Labs Lab 08/09/14 0910 08/10/14 0347 08/11/14 0440 08/12/14 1025 08/13/14 0031  NA 138 141 141  --  137  K 5.5* 5.0 6.1* 5.9* 6.0*  CL 106 111 111  --  107  CO2 14* 14* 15*  --  19  GLUCOSE 96 97 83  --  102*  BUN 88* 81* 68*  --  58*  CREATININE 4.40* 4.05* 3.57*  --  3.10*  CALCIUM 8.6 8.4 8.5  --  9.0  ALKPHOS 83  --   --   --   --   AST 12  --   --   --   --   ALT 12  --   --   --   --   ALBUMIN 3.4*  --   --   --   --   INR 1.10 Troponin neg x 1 EKG 08/09/2014 with LVH, flipped T waves in leads 2, 3, aVF, V4, V5, V6  Chest x-ray 08/09/2014: No acute abnormalities noted  Results/Tests Pending at Time of Discharge: None  Discharge Medications:    Medication List    STOP taking these medications        atorvastatin 40 MG tablet  Commonly known as:  LIPITOR  hydrALAZINE 25 MG tablet  Commonly known as:  APRESOLINE     metoprolol tartrate 25 MG tablet  Commonly known as:  LOPRESSOR     omeprazole 20 MG capsule  Commonly known as:  PRILOSEC      TAKE these medications        cinacalcet 60 MG tablet  Commonly known as:  SENSIPAR  Take 60 mg by mouth daily.     furosemide 20 MG tablet  Commonly known as:  LASIX  Take 1 tablet (20 mg total) by mouth 2 (two) times daily.     nitroGLYCERIN 0.4 MG SL tablet  Commonly known as:  NITROSTAT  Place 0.4 mg under the tongue every 5 (five) minutes as needed for chest pain.     traMADol 50 MG tablet  Commonly known as:  ULTRAM  Take 50 mg by mouth every 6 (six) hours as needed for moderate pain.      vitamin D (CHOLECALCIFEROL) 400 UNITS tablet  Take 2 tablets (800 Units total) by mouth daily.        Discharge Instructions: Please refer to Patient Instructions section of EMR for full details.  Patient was counseled important signs and symptoms that should prompt return to medical care, changes in medications, dietary instructions, activity restrictions, and follow up appointments.   Follow-Up Appointments: Follow-up Information    Follow up with Carney Living, MD On 08/22/2014.   Specialty:  Family Medicine   Why:  at 10am for a hospital follow-up   Contact information:   [address] [place] [address] [phone]       Follow up with [facility].   Why:  Registered Nurse and Social Worker to start services within 24-48 hours of discharge   Contact information:   [address] [place] [address] [phone]       Follow up with [facility] .   Why:  Resume PCS services at discharge   Contact information:   [phone]      Follow up with [facility].   Why:  Community Resource Follow-up post hospital discharge      Joanna Puff, MD 08/14/2014, 1:18 PM PGY-1, [facility] Family Medicine

## 2014-08-10 NOTE — Progress Notes (Addendum)
INITIAL NUTRITION ASSESSMENT  DOCUMENTATION CODES Per approved criteria  -Non-severe (moderate) malnutrition in the context of chronic illness  Pt meets criteria for MODERATE (NON-SEVERE) MALNUTRITION in the context of CHRONIC ILLNESS as evidenced by moderate loss of subcutaneous fat and moderate loss of muscle mass.  INTERVENTION: Provide Ensure Complete TID, each supplement provides 350 kcal and 13 grams of protein Encourage PO intake  NUTRITION DIAGNOSIS: Inadequate oral intake related to poor appetite as evidenced by 10-25% meal completion, pt's report, and moderate muscle wasting.   Goal: Pt to meet >/= 90% of their estimated nutrition needs   Monitor:  PO intake, weight trend, labs  Reason for Assessment: Malnutrition Screening Tool, score of2  80 y.o. female  Admitting Dx: GI Bleed  ASSESSMENT: 80 y.o. female presenting with GI bleed . PMH is significant for HTN, CKD stage IV, , sick sinus syndrome, CAD,systolic CHF.   Pt difficult to understand but, able to provide some history. She reports that she has no appetite for a while and due to little interest in eating regular food she drinks 3 to 4 bottles of Ensure every day. She states she has lost weight but, unable to clarify how much. Per weight history, pt's weight appears to have been fairly stable for the past year. Per nursing notes pt has been eating 10-50% of meals.  Per MD note, hopefully d/c pt with hospice  Nutrition Focused Physical Exam:  Subcutaneous Fat:  Orbital Region: wnl Upper Arm Region: moderate wasting Thoracic and Lumbar Region: NA  Muscle:  Temple Region: moderate wasting Clavicle Bone Region: moderate wasting Clavicle and Acromion Bone Region: mild wasting Scapular Bone Region: NA Dorsal Hand: moderate wasting Patellar Region: wnl Anterior Thigh Region: wnl Posterior Calf Region: mild wasting  Edema: none noted  Height: Ht Readings from Last 1 Encounters:  08/09/14 5' (1.524 m)     Weight: Wt Readings from Last 1 Encounters:  08/10/14 124 lb 12.5 oz (56.6 kg)    Ideal Body Weight: 100 lbs  % Ideal Body Weight: 124%  Wt Readings from Last 10 Encounters:  08/10/14 124 lb 12.5 oz (56.6 kg)  07/16/14 127 lb (57.607 kg)  07/10/14 125 lb 1.6 oz (56.745 kg)  05/21/14 125 lb (56.7 kg)  04/03/14 125 lb (56.7 kg)  02/26/14 122 lb 6.4 oz (55.52 kg)  11/01/13 122 lb (55.339 kg)  10/23/13 126 lb (57.153 kg)  07/24/13 126 lb (57.153 kg)  06/20/13 129 lb 4.8 oz (58.65 kg)    Usual Body Weight: unknown  % Usual Body Weight: NA  BMI:  Body mass index is 24.37 kg/(m^2).  Estimated Nutritional Needs: Kcal: 1400-1600 Protein: 55-65 grams Fluid: 1.2 L restriction  Skin: WDL  Diet Order: Diet renal W/121900mL fluid restriction  EDUCATION NEEDS: -No education needs identified at this time   Intake/Output Summary (Last 24 hours) at 08/10/14 1026 Last data filed at 08/10/14 0819  Gross per 24 hour  Intake 930.75 ml  Output    975 ml  Net -44.25 ml    Last BM: 11/11   Labs:   Recent Labs Lab 08/09/14 0910 08/10/14 0347  NA 138 141  K 5.5* 5.0  CL 106 111  CO2 14* 14*  BUN 88* 81*  CREATININE 4.40* 4.05*  CALCIUM 8.6 8.4  GLUCOSE 96 97    CBG (last 3)   Recent Labs  08/09/14 0827  GLUCAP 102*    Scheduled Meds: . sodium chloride   Intravenous Once  . atorvastatin  40  mg Oral QHS  . cholecalciferol  800 Units Oral Daily  . cinacalcet  60 mg Oral QAC breakfast  . pantoprazole (PROTONIX) IV  40 mg Intravenous Q12H  . sodium chloride  3 mL Intravenous Q12H    Continuous Infusions:   Past Medical History  Diagnosis Date  . Coronary artery disease, non-occlusive 2005    Last cath in 2005 - no significant disease; Myoview 2009 no ischemia  . Hypertension - labile   . Renal disorder   . UTI (lower urinary tract infection) 10/10/2012  . Stroke   . CKD (chronic kidney disease), stage IV   . Speech impediment   . Dyslipidemia   .  Non-ischemic cardiomyopathy     Echo 09/2012:EF <20% with severe global hypokinesis (down from ~45% in 2012), Aortic Sclerosis.  Trivial pericardial effusion; Pt declined furhter investigation, Myoview if + would lead to cath & she does not want cath.  . Tachycardia-bradycardia     Declines PPM  . Paresthesia     fingers  . Dizziness   . CHF (congestive heart failure)   . GI bleed 08/09/2014    Past Surgical History  Procedure Laterality Date  . Abdominal surgery      midline abdominal scar   . Knee surgery      both knees   . Renal doppler  05/02/2005    Non-specific medical renal disease with bilateral renal cysts. Otherwise no significant abnormality with no obstructive uropathy.  . Cardiac catheterization Left 06/11/2004    Angiographically patent coronary arteries, normal LV end-diastolic pressure.  . Cardiovascular stress test  05/22/2008    Perfusion defect in inferior myocardial region is consistent with diaphragmatic attenuation. Remaining myocardium demonstrates normal myocardial perfusion with no evidence of ischemia of infarct. No ECG changes. EKG negative for ischemia.  . Transthoracic echocardiogram  10/11/2012    EF <20%, moderate concentric hypertrophy, grade 1 diastolic dysfunction, aortic valve sclerodid (not stenosis).    Ian Malkin RD, LDN Inpatient Clinical Dietitian Pager: [phone] After Hours Pager: [phone]

## 2014-08-11 DIAGNOSIS — E785 Hyperlipidemia, unspecified: Secondary | ICD-10-CM | POA: Insufficient documentation

## 2014-08-11 DIAGNOSIS — Z515 Encounter for palliative care: Secondary | ICD-10-CM | POA: Insufficient documentation

## 2014-08-11 DIAGNOSIS — R630 Anorexia: Secondary | ICD-10-CM | POA: Insufficient documentation

## 2014-08-11 DIAGNOSIS — K921 Melena: Secondary | ICD-10-CM | POA: Insufficient documentation

## 2014-08-11 DIAGNOSIS — E440 Moderate protein-calorie malnutrition: Secondary | ICD-10-CM | POA: Insufficient documentation

## 2014-08-11 LAB — BASIC METABOLIC PANEL
ANION GAP: 15 (ref 5–15)
BUN: 68 mg/dL — ABNORMAL HIGH (ref 6–23)
CALCIUM: 8.5 mg/dL (ref 8.4–10.5)
CO2: 15 mEq/L — ABNORMAL LOW (ref 19–32)
Chloride: 111 mEq/L (ref 96–112)
Creatinine, Ser: 3.57 mg/dL — ABNORMAL HIGH (ref 0.50–1.10)
GFR calc non Af Amer: 11 mL/min — ABNORMAL LOW (ref >90)
GFR, EST AFRICAN AMERICAN: 13 mL/min — AB (ref >90)
Glucose, Bld: 83 mg/dL (ref 70–99)
Potassium: 6.1 mEq/L — ABNORMAL HIGH (ref 3.7–5.3)
SODIUM: 141 meq/L (ref 137–147)

## 2014-08-11 LAB — CBC
HCT: 30.2 % — ABNORMAL LOW (ref 36.0–46.0)
Hemoglobin: 9.5 g/dL — ABNORMAL LOW (ref 12.0–15.0)
MCH: 28.6 pg (ref 26.0–34.0)
MCHC: 31.5 g/dL (ref 30.0–36.0)
MCV: 91 fL (ref 78.0–100.0)
PLATELETS: 130 10*3/uL — AB (ref 150–400)
RBC: 3.32 MIL/uL — ABNORMAL LOW (ref 3.87–5.11)
RDW: 13.9 % (ref 11.5–15.5)
WBC: 5.4 10*3/uL (ref 4.0–10.5)

## 2014-08-11 MED ORDER — CHOLESTYRAMINE 4 G PO PACK
4.0000 g | PACK | Freq: Two times a day (BID) | ORAL | Status: AC
  Administered 2014-08-11 (×2): 4 g via ORAL
  Filled 2014-08-11 (×2): qty 1

## 2014-08-11 MED ORDER — SODIUM POLYSTYRENE SULFONATE 15 GM/60ML PO SUSP
15.0000 g | Freq: Once | ORAL | Status: AC
  Administered 2014-08-11: 15 g via ORAL
  Filled 2014-08-11: qty 60

## 2014-08-11 NOTE — Progress Notes (Addendum)
Family Medicine Teaching Service Daily Progress Note Intern Pager: [phone]  Patient name: Lovie Macadamiaris A [place] Medical record number: [id number] Date of birth: 05/22/1929 Age: 80 y.o. Gender: female  Primary Care Provider: Carney LivingHAMBLISS,MARSHALL L, MD Consultants: Palliative care Code Status: DNR  Pt Overview and Major Events to Date:  11/13: Hematochezia   Assessment and Plan: Ambreen A Benay PikeGilmer is a 80 y.o. female presenting with GI bleed . PMH is significant for HTN, CKD stage IV, , sick sinus syndrome, CAD,systolic CHF.   #GI bleed, abdominal pain, hypothermia: likely lower GI bleed given presentation, however history is unclear so will go ahead and treat with PPI BID, initially found to be hypothermic with temp 95.3 F.  - vital signs currently stable, mildly hypertensive, temperature 98.  - Unclear etiology for abdominal pain but likely related to GI bleed: currently denies abdominal pain is currently resolved and states there has been no BPR. - Patient is DNR, would not like any intervention such as colonoscopy or EGD; hospice and palliative have been consulted. Defer GI consult for now considering that she would not like interventions. -  Palliative consult considering severe kidney disease, severe heart disease, and now GI bleed which without intervention could possibly lead to her death. She should qualify for hospice considering with GI bleed patient is at high risk for reoccurrence, which would be life threatening, along with ESRD without hemodialysis pts life expectancy is likely 6 months or less. - status post transfusion 1 unit, follow-up posttransfusion hemoglobin from 9.3 > 9.7> 9.5 - CBC only if blood is noted in stool or rectal bleeding occurs.  - Per discussion with pt's PCP on admission, this DNR is consistent with her previous desires  #Chest pain: Resolved: EKG done in the ED with flipped T waves in leads 3, aVF, V4, V5 more exaggerated from previous exam, and LVH. Likely exaggerated  flipped T waves due to anemia - PRN nitroglycerin  #Hypertension: Blood pressures stable and within normal limits.  - Given GI bleed and likely volume depletion will hold Lasix and metoprolol  #CKD stage IV, hyperkalemia, elevated BUNs: Creatine on admission 4.05 >> 3.57 today. Patient does not desire to have dialysis. - Kayexalate 15 mg >>>  K went from 5.5 > 5.0 > 6.1 (today) - continue Sensipar and vitamin D - will monitor BMP tonight  #CAD, sick sinus syndrome, chronic systolic CHF - CP >> resolved  - Some episodes of sinus tachy. Tachycardia 118 this morning. Regular rhythm  - continue Lipitor - aspirin held due to current GI bleed, stopped by her PCP a couple of days ago  FEN/GI: renal diet, fluids to [facility] Prophylaxis: no heparin due to current GI bleed, SCDs  Disposition: Pending palliative recs, hopefully D/C with hospice   Subjective:   Patient denies chest pain, abdominal pain, or SOB. States she hasn't has a BM overnight. Doesn't eat much in the AM.  Objective: Temp:  [98 F (36.7 C)-98.4 F (36.9 C)] 98 F (36.7 C) (11/14 0647) Pulse Rate:  [106-118] 112 (11/14 0647) Resp:  [16-18] 18 (11/14 0647) BP: (154-166)/(64-69) 154/69 mmHg (11/14 0647) SpO2:  [99 %-100 %] 100 % (11/14 0647) Weight:  [123 lb 3.1 oz (55.88 kg)] 123 lb 3.1 oz (55.88 kg) (11/14 14780647) Physical Exam: General: Sitting up in bedside chair in NAD. Her tongue remains protruded out of her mouth slightly as she talks Cardiovascular: RRR, no m/r/g noted 1+ DP and radial pulses Respiratory: No increased WOB. Faint crackles at the right base.  No wheezing or rhonchi  Abdomen: +BS, soft, non-tended, non-distended. Multiple well healed scars  Extremities: No edema noted.  Laboratory:  Recent Labs Lab 08/09/14 0910 08/10/14 0347 08/11/14 0440  WBC 4.1 7.3 5.4  HGB 9.3* 9.7* 9.5*  HCT 29.5* 30.0* 30.2*  PLT 139* 128* 130*    Recent Labs Lab 08/09/14 0910 08/10/14 0347 08/11/14 0440  NA 138  141 141  K 5.5* 5.0 6.1*  CL 106 111 111  CO2 14* 14* 15*  BUN 88* 81* 68*  CREATININE 4.40* 4.05* 3.57*  CALCIUM 8.6 8.4 8.5  PROT 7.0  --   --   BILITOT 0.4  --   --   ALKPHOS 83  --   --   ALT 12  --   --   AST 12  --   --   GLUCOSE 96 97 83   Lactic acid 1.18  Imaging/Diagnostic Tests: EKG 08/09/2014 with LVH, flipped T waves in leads 2, 3, aVF, V4, V5, V6  Chest x-ray 08/09/2014: No acute abnormalities noted  Natalia Leatherwood, DO 08/11/2014, 10:03 AM PGY-3, [facility] Intern pager: [phone], text pages welcome

## 2014-08-11 NOTE — Consult Note (Signed)
Patient Frances [name]      DOB: Sep 08, 1929      KJI:[id number]     Consult Note from the Palliative Medicine Team at [facility]    Consult Requested by: Dr Deirdre Priest    PCP: Carney Living, MD Reason for Consultation:? Of hospice eligability    Phone Number:[phone]  Assessment/Recommendations: 80 yo female with CKD on ESRD, CHF (last EF 20%), CAD who presented with GIB.  Palliative consulted as patient wants care to focus on comfort. ? Of hospice eligibility.   1.  Code Status: DNR  2. Goals of Care: Goals clarified well by FM.  Her goal is to be home and not have to come back to hospital. Focus of care on comfrot. i confirmed this with her today.  She had admission last month with hyperkalemia related to her kidney disease. K elevated again here.  She does not ever want dialysis. I think this along with her comorbid diseases give her a prognosis that is most likely 6 months or less. She has a paid caregiver at home [facility]) who is with her 2h/day 7days per week. While she requests home hospice, I have fears that she may lose these paid caregvier hours.  Will need case manager to explore options there.  I doubt hospice would provide the care needs Sherry Ruffing has done (housework, cooking/cleaning).  If not home hospice eligible, we may be looking at SNF vs home with hopefully one of the hospice agencies being able to provide intermittent palliative care services as outpatient and plan to transition to hospice when event leading to active dying occurs.  Confirmed with Sherry Ruffing that only living relative is a Son in [place] whom she has little to do with.    3. HLD- I will d/c atorvastatin given goals and this additional med will not contribute to comfort or her prognosis.   4. Loss of Appetite- secondary to her medical issues, especially uremia/Kidney disease. Could consider remeron but I am not sure how much this would add to her QOL.    5. Psychosocial/Spiritual: lived in  [place] since age 52.  Has paid caregiveer at home.  Husband deceased. Had 2 children. Her daughter has passed away. Son in [place] whom she does not have much contact with and does not even know his phone number. Sherry Ruffing confirms she has little to do with him.     Brief HPI: 80 yo female with HLD, CHF (EF 20%, CKD on ESRD). Recent admission with hyperkalemia. Presented with presumed LGIB. No GI workup desired by patient.  Hyperkalemia again here, presumed 2/2 kidney disease. Does not want dialysis. Was living at home with paid caregiver doing 2hrs/day.  She is able to ambulate, bath and change herself but spends most of her time in bed watching TV.  Caregiver helps with meals and housework. She does not have any physical complaints that she tells me. She does not have much of an appetite. Sleeps well at night. Not depressed.  Interested in potential home hospice care.      PMH:  Past Medical History  Diagnosis Date  . Coronary artery disease, non-occlusive 2005    Last cath in 2005 - no significant disease; Myoview 2009 no ischemia  . Hypertension - labile   . Renal disorder   . UTI (lower urinary tract infection) 10/10/2012  . Stroke   . CKD (chronic kidney disease), stage IV   . Speech impediment   . Dyslipidemia   . Non-ischemic cardiomyopathy  Echo 09/2012:EF <20% with severe global hypokinesis (down from ~45% in 2012), Aortic Sclerosis.  Trivial pericardial effusion; Pt declined furhter investigation, Myoview if + would lead to cath & she does not want cath.  . Tachycardia-bradycardia     Declines PPM  . Paresthesia     fingers  . Dizziness   . CHF (congestive heart failure)   . GI bleed 08/09/2014     PSH: Past Surgical History  Procedure Laterality Date  . Abdominal surgery      midline abdominal scar   . Knee surgery      both knees   . Renal doppler  05/02/2005    Non-specific medical renal disease with bilateral renal cysts. Otherwise no significant abnormality with no  obstructive uropathy.  . Cardiac catheterization Left 06/11/2004    Angiographically patent coronary arteries, normal LV end-diastolic pressure.  . Cardiovascular stress test  05/22/2008    Perfusion defect in inferior myocardial region is consistent with diaphragmatic attenuation. Remaining myocardium demonstrates normal myocardial perfusion with no evidence of ischemia of infarct. No ECG changes. EKG negative for ischemia.  . Transthoracic echocardiogram  10/11/2012    EF <20%, moderate concentric hypertrophy, grade 1 diastolic dysfunction, aortic valve sclerodid (not stenosis).   I have reviewed the FH and SH and  If appropriate update it with new information. Allergies  Allergen Reactions  . Catapres [Clonidine Hcl] Rash   Scheduled Meds: . atorvastatin  40 mg Oral QHS  . cholecalciferol  800 Units Oral Daily  . cholestyramine  4 g Oral BID  . cinacalcet  60 mg Oral QAC breakfast  . feeding supplement (ENSURE COMPLETE)  237 mL Oral TID BM  . pantoprazole (PROTONIX) IV  40 mg Intravenous Q12H  . polyethylene glycol  17 g Oral Daily  . sodium chloride  3 mL Intravenous Q12H   Continuous Infusions:  PRN Meds:.nitroGLYCERIN, ondansetron **OR** ondansetron (ZOFRAN) IV, traMADol    BP 154/69 mmHg  Pulse 112  Temp(Src) 98 F (36.7 C) (Oral)  Resp 18  Ht 5' (1.524 m)  Wt 55.88 kg (123 lb 3.1 oz)  BMI 24.06 kg/m2  SpO2 100%   PPS: 40   Intake/Output Summary (Last 24 hours) at 08/11/14 1051 Last data filed at 08/11/14 0900  Gross per 24 hour  Intake    360 ml  Output    977 ml  Net   -617 ml    Physical Exam:  General: Alert, sitting up in chair NAD HEENT:  [place], sclera anciteric  Labs: CBC    Component Value Date/Time   WBC 5.4 08/11/2014 0440   RBC 3.32* 08/11/2014 0440   HGB 9.5* 08/11/2014 0440   HCT 30.2* 08/11/2014 0440   PLT 130* 08/11/2014 0440   MCV 91.0 08/11/2014 0440   MCH 28.6 08/11/2014 0440   MCHC 31.5 08/11/2014 0440   RDW 13.9 08/11/2014 0440    LYMPHSABS 0.9 10/23/2013 0845   MONOABS 0.6 10/23/2013 0845   EOSABS 0.1 10/23/2013 0845   BASOSABS 0.0 10/23/2013 0845    BMET    Component Value Date/Time   NA 141 08/11/2014 0440   NA 144 08/23/2013 0900   K 6.1* 08/11/2014 0440   CL 111 08/11/2014 0440   CO2 15* 08/11/2014 0440   GLUCOSE 83 08/11/2014 0440   BUN 68* 08/11/2014 0440   BUN 53* 08/23/2013 0900   CREATININE 3.57* 08/11/2014 0440   CREATININE 2.9* 08/23/2013 0900   CREATININE 6.30* 10/17/2012 1524   CALCIUM 8.5 08/11/2014  0440   GFRNONAA 11* 08/11/2014 0440   GFRAA 13* 08/11/2014 0440    CMP     Component Value Date/Time   NA 141 08/11/2014 0440   NA 144 08/23/2013 0900   K 6.1* 08/11/2014 0440   CL 111 08/11/2014 0440   CO2 15* 08/11/2014 0440   GLUCOSE 83 08/11/2014 0440   BUN 68* 08/11/2014 0440   BUN 53* 08/23/2013 0900   CREATININE 3.57* 08/11/2014 0440   CREATININE 2.9* 08/23/2013 0900   CREATININE 6.30* 10/17/2012 1524   CALCIUM 8.5 08/11/2014 0440   PROT 7.0 08/09/2014 0910   ALBUMIN 3.4* 08/09/2014 0910   AST 12 08/09/2014 0910   ALT 12 08/09/2014 0910   ALKPHOS 83 08/09/2014 0910   BILITOT 0.4 08/09/2014 0910   GFRNONAA 11* 08/11/2014 0440   GFRAA 13* 08/11/2014 0440   11/12 CXR IMPRESSION: No acute abnormality noted.  Total Time: 50 minutes  Greater than 50%  of this time was spent counseling and coordinating care related to the above assessment and plan.  Orvis Brill D.O. Palliative Medicine Team at [facility]  Pager: [phone] Team Phone: [phone]

## 2014-08-12 DIAGNOSIS — I5022 Chronic systolic (congestive) heart failure: Secondary | ICD-10-CM

## 2014-08-12 DIAGNOSIS — N185 Chronic kidney disease, stage 5: Secondary | ICD-10-CM

## 2014-08-12 DIAGNOSIS — E785 Hyperlipidemia, unspecified: Secondary | ICD-10-CM

## 2014-08-12 DIAGNOSIS — I10 Essential (primary) hypertension: Secondary | ICD-10-CM

## 2014-08-12 LAB — POTASSIUM: Potassium: 5.9 mEq/L — ABNORMAL HIGH (ref 3.7–5.3)

## 2014-08-12 NOTE — Progress Notes (Signed)
[name] Teaching Service Daily Progress Note Intern Pager: [phone]  Patient name: Frances [name] Medical record number: [id number] Date of birth: 12-Jul-1929 Age: 80 y.o. Gender: female  Primary Care Provider: Carney Living, MD Consultants: Palliative care Code Status: DNR  Pt Overview and Major Events to Date:  11/13: Hematochezia   Assessment and Plan: Frances [name] is a 80 y.o. female presenting with GI bleed . PMH is significant for HTN, CKD stage IV, , sick sinus syndrome, CAD,systolic CHF.   #GI bleed, abdominal pain, hypothermia: likely lower GI bleed given presentation, however history is unclear so will go ahead and treat with PPI BID, initially found to be hypothermic with temp 95.3 F.  - vital signs currently stable, mildly hypertensive, temperature 98.5  - Unclear etiology for abdominal pain but likely related to GI bleed: currently denies abdominal pain is currently resolved and states there has been no BPR. - status post transfusion 1 unit, follow-up posttransfusion hemoglobin from 9.3 > 9.7> 9.5 - CBC only if blood is noted in stool or rectal bleeding occurs.   Palliative care:  - Patient is DNR, would not like any intervention such as colonoscopy or EGD; hospice and palliative have been consulted. Defer GI consult for now considering that she would not like interventions. -  Palliative consult considering severe kidney disease, severe heart disease, and now GI bleed which without intervention could possibly lead to her death. She should qualify for hospice considering  GI bleed patient is at high risk for reoccurrence, which would be life threatening, along with ESRD without hemodialysis pts life expectancy is likely 6 months or less. - Palliative and care management working on placement (please see note) - Discussed lab draws with patient today, considering her Hgb was being monitored and K was elevated. She states she is ok with have lab draws if we need to  follow something. Will collect a K level this morning to ensure appropriate responded to kayexalate  administration yesterday. - tramadol for shoulder pain.   #Chest pain: Resolved: EKG done in the ED with flipped T waves in leads 3, aVF, V4, V5 more exaggerated from previous exam, and LVH. Likely exaggerated flipped T waves due to anemia - PRN nitroglycerin  #Hypertension: Blood pressures stable and within normal limits.  - Given GI bleed and likely volume depletion will hold Lasix and metoprolol  #CKD stage IV, hyperkalemia, elevated BUNs: Creatine on admission 4.05 >> 3.57 today. Patient does not desire to have dialysis. -   K went from 5.5 > 5.0 > 6.1 yesterday>>>Kayexalate 15 mg given yesterday  >>> K level this morning - continue Sensipar and vitamin D  #CAD, sick sinus syndrome, chronic systolic CHF - CP >> resolved  - Some episodes of sinus tachy. Tachycardia 104 this morning. Regular rhythm  - discontinued  Lipitor >> comfort care measures only  - aspirin held due to current GI bleed, stopped by her PCP a couple of days ago  FEN/GI: renal diet, fluids to [facility] Prophylaxis: no heparin due to current GI bleed, SCDs  Disposition: Pending palliative recs, hopefully D/C with hospice or SNF  Subjective:   Patient denies chest pain, abdominal pain, or SOB. States she hasn't has a bloody BM for over 24 hours.  She does complain of mild left shoulder pain, which she states is chronic. Doesn't eat much.   Objective: Temp:  [97.2 F (36.2 C)-98.5 F (36.9 C)] 98.5 F (36.9 C) (11/15 0517) Pulse Rate:  [101-108] 104 (11/15 0517)  Resp:  [20] 20 (11/15 0517) BP: (148-153)/(65-74) 152/65 mmHg (11/15 0517) SpO2:  [97 %-99 %] 97 % (11/15 0517) Weight:  [122 lb 2.2 oz (55.4 kg)] 122 lb 2.2 oz (55.4 kg) (11/15 0517) Physical Exam: General: Lying in bed. Alert. Difficult to understand, but at baseline.  Cardiovascular: RRR, no m/r/g noted 1+ DP and radial pulses Respiratory: No increased  WOB. Faint crackles at the right base. No wheezing or rhonchi  Abdomen: +BS, soft, non-tended, non-distended. Multiple well healed scars  Extremities: No edema noted.  Laboratory:  Recent Labs Lab 08/09/14 0910 08/10/14 0347 08/11/14 0440  WBC 4.1 7.3 5.4  HGB 9.3* 9.7* 9.5*  HCT 29.5* 30.0* 30.2*  PLT 139* 128* 130*    Recent Labs Lab 08/09/14 0910 08/10/14 0347 08/11/14 0440  NA 138 141 141  K 5.5* 5.0 6.1*  CL 106 111 111  CO2 14* 14* 15*  BUN 88* 81* 68*  CREATININE 4.40* 4.05* 3.57*  CALCIUM 8.6 8.4 8.5  PROT 7.0  --   --   BILITOT 0.4  --   --   ALKPHOS 83  --   --   ALT 12  --   --   AST 12  --   --   GLUCOSE 96 97 83   Lactic acid 1.18  Imaging/Diagnostic Tests: EKG 08/09/2014 with LVH, flipped T waves in leads 2, 3, aVF, V4, V5, V6  Chest x-ray 08/09/2014: No acute abnormalities noted  Frances Leatherwoodenee A Kuneff, DO 08/12/2014, 7:23 AM PGY-3, Frances [name] FPTS Intern pager: [phone], text pages welcome

## 2014-08-12 NOTE — Plan of Care (Signed)
Problem: Phase I Progression Outcomes Goal: Initial discharge plan identified Outcome: Completed/Met Date Met:  08/12/14

## 2014-08-13 DIAGNOSIS — R0789 Other chest pain: Secondary | ICD-10-CM

## 2014-08-13 DIAGNOSIS — N189 Chronic kidney disease, unspecified: Secondary | ICD-10-CM

## 2014-08-13 LAB — BASIC METABOLIC PANEL
ANION GAP: 11 (ref 5–15)
BUN: 58 mg/dL — ABNORMAL HIGH (ref 6–23)
CHLORIDE: 107 meq/L (ref 96–112)
CO2: 19 mEq/L (ref 19–32)
Calcium: 9 mg/dL (ref 8.4–10.5)
Creatinine, Ser: 3.1 mg/dL — ABNORMAL HIGH (ref 0.50–1.10)
GFR calc non Af Amer: 13 mL/min — ABNORMAL LOW (ref >90)
GFR, EST AFRICAN AMERICAN: 15 mL/min — AB (ref >90)
Glucose, Bld: 102 mg/dL — ABNORMAL HIGH (ref 70–99)
POTASSIUM: 6 meq/L — AB (ref 3.7–5.3)
Sodium: 137 mEq/L (ref 137–147)

## 2014-08-13 MED ORDER — SODIUM POLYSTYRENE SULFONATE 15 GM/60ML PO SUSP
30.0000 g | Freq: Once | ORAL | Status: AC
  Administered 2014-08-13: 30 g via ORAL
  Filled 2014-08-13: qty 120

## 2014-08-13 NOTE — Clinical Documentation Improvement (Signed)
Presents with GIB, moderate Malnutrition, chronic systolic CHF.   Conflicting documentation in regards to Stage of CKD: CKD4 by Medicine vs ESRD by Renal Consult.  Black female  GFR's ranging from 13 to 15 for this current admission  Please clarify the Stage of CKD the patient has and document findings in next progress note and include in discharge summary.  _______CKD Stage I - GFR > OR = 90 _______CKD Stage II - GFR 60-80 _______CKD Stage III - GFR 30-59 _______CKD Stage IV - GFR 15-29 _______CKD Stage V - GFR < 15 _______ESRD (End Stage Renal Disease) _______Other condition_____________   Jessie Foot ,RN Clinical Documentation Specialist:  [id number]  [facility]- Health Information Management

## 2014-08-13 NOTE — Progress Notes (Signed)
Family Medicine Teaching Service Daily Progress Note Intern Pager: [phone]  Patient name: Frances [name] Medical record number: [id number] Date of birth: 04-Mar-1929 Age: 80 y.o. Gender: female  Primary Care Provider: Carney LivingHAMBLISS,MARSHALL L, MD Consultants: Palliative care Code Status: DNR  Pt Overview and Major Events to Date:  11/13: Hematochezia. Given 1u pRBC in ED   Assessment and Plan: Frances [name] is a 80 y.o. female presenting with GI bleed . PMH is significant for HTN, CKD stage IV, , sick sinus syndrome, CAD,systolic CHF.   #GI bleed, abdominal pain, hypothermia: likely lower GI bleed given presentation, however history is unclear so will go ahead and treat with PPI BID, initially found to be hypothermic with temp 95.3 F.  - vital signs currently stable, mildly hypertensive overnight, temperature currently 97.9  - Unclear etiology for abdominal pain but likely related to GI bleed: currently denies abdominal pain or BRBPR - status post transfusion 1 unit, follow-up posttransfusion hemoglobin from 9.3 > 9.7> 9.5 - CBC only if blood is noted in stool or rectal bleeding occurs.  - Will not consult GI as patient does not want any intervention   #Palliative care:  - Patient is DNR, would not like any intervention such as colonoscopy or EGD; hospice and palliative have been consulted.  -  Palliative consult considering severe kidney disease, severe heart disease, and now GI bleed which without intervention could possibly lead to her death. She should qualify for hospice considering that a GI bleed patient is at high risk for reoccurrence, which would be life threatening, along with ESRD without hemodialysis which makes her life expectancy less than 6 months.  - Palliative and care management working on placement (please see note) - Discussed lab draws with patient , considering her Hgb was being monitored and K was elevated. She states she is ok with have lab draws if we need to follow  something.  - tramadol for shoulder pain.   #Chest pain: Resolved: EKG done in the ED with flipped T waves in leads 3, aVF, V4, V5 more exaggerated from previous exam, and LVH. Likely exaggerated flipped T waves due to anemia - PRN nitroglycerin  #Hypertension: Blood pressures stable and within normal limits/ intermittently elevated  - Given GI bleed and likely volume depletion will hold Lasix and metoprolol  #CKD stage IV, hyperkalemia, elevated BUNs: Creatine on admission 4.05 >> 3.57 today. Patient does not desire to have dialysis. -  K went from 5.5 > 5.0 > 6.1 yesterday>>>Kayexalate 15 mg given yesterday  >>> 6.1 -  Will give Kayexalate today. - continue Sensipar and vitamin D  #CAD, sick sinus syndrome, chronic systolic CHF - Chest pain >> resolved  - Some episodes of sinus tachy. Tachycardia up to 109 this morning. Regular rhythm  - discontinued  Lipitor >> comfort care measures only  - aspirin held due to current GI bleed, stopped by her PCP a few days prior to admission  FEN/GI: renal diet, fKVO Prophylaxis: no heparin due to current GI bleed, SCDs  Disposition: Pending palliative recs, hopefully D/C with hospice or SNF  Subjective:  Patient doing well. Denies chest pain, abdominal pain, SOB, or bloody stools. Ready to leave the hospital   Objective: Temp:  [97.6 F (36.4 C)-97.9 F (36.6 C)] 97.9 F (36.6 C) (11/16 0544) Pulse Rate:  [94-109] 94 (11/16 0544) Resp:  [16-20] 16 (11/16 0544) BP: (132-154)/(62-69) 133/67 mmHg (11/16 0544) SpO2:  [100 %] 100 % (11/16 0544) Weight:  [124 lb (56.246 kg)]  124 lb (56.246 kg) (11/16 0544) Physical Exam: General: Lying in bed. Alert.  Cardiovascular: RRR, no m/r/g noted 1+ DP and radial pulses Respiratory: No increased WOB. Lungs CTAB with no wheezing, crackles or rhonchi  Abdomen: +BS, soft, non-tended, non-distended. Multiple well healed scars  Extremities: No edema noted.  Laboratory:  Recent Labs Lab 08/09/14 0910  08/10/14 0347 08/11/14 0440  WBC 4.1 7.3 5.4  HGB 9.3* 9.7* 9.5*  HCT 29.5* 30.0* 30.2*  PLT 139* 128* 130*    Recent Labs Lab 08/09/14 0910 08/10/14 0347 08/11/14 0440 08/12/14 1025 08/13/14 0031  NA 138 141 141  --  137  K 5.5* 5.0 6.1* 5.9* 6.0*  CL 106 111 111  --  107  CO2 14* 14* 15*  --  19  BUN 88* 81* 68*  --  58*  CREATININE 4.40* 4.05* 3.57*  --  3.10*  CALCIUM 8.6 8.4 8.5  --  9.0  PROT 7.0  --   --   --   --   BILITOT 0.4  --   --   --   --   ALKPHOS 83  --   --   --   --   ALT 12  --   --   --   --   AST 12  --   --   --   --   GLUCOSE 96 97 83  --  102*   Lactic acid 1.18  Imaging/Diagnostic Tests: EKG 08/09/2014 with LVH, flipped T waves in leads 2, 3, aVF, V4, V5, V6  Chest x-ray 08/09/2014: No acute abnormalities noted  Joanna Puff, MD 08/13/2014, 6:56 AM PGY-1, [facility] FPTS Intern pager: [phone], text pages welcome

## 2014-08-14 ENCOUNTER — Telehealth: Payer: Self-pay | Admitting: *Deleted

## 2014-08-14 NOTE — Telephone Encounter (Signed)
Received voice message from [place], [place] with [facility] needing clarification on what patients needs to be taking.  Pt was discharged from hospital 08/13/2014 and she was setting up her home visit.  Left voice mail for Archie Patten to return nurse call.  Clovis Pu, RN

## 2014-08-15 NOTE — Telephone Encounter (Signed)
Frances [name] called from [facility] and has had her first visit with the patient and is following what the discharge papers state as far as her medications: She is to stop taking Lipitor, Aspirin, metoprolol, hydralazine, and omeprazole. She is taking Vitamin D, Tramadol, Lasix, cinacalcet, and nitroglycerin. Frances [name] just wanted to make sure that the doctor is aware of this and that this is okay. Please call Frances [name] at [phone] if you want them to change anything.  jw

## 2014-08-16 NOTE — Telephone Encounter (Signed)
Spoke with her  Agree discharge all those medications except omeprazole which she should be taking   She will notify caregiver and continue to monitor with 4 more home visits

## 2014-08-17 ENCOUNTER — Telehealth: Payer: Self-pay | Admitting: Family Medicine

## 2014-08-17 NOTE — Telephone Encounter (Signed)
List of meds and dx faxed to health department. Natayla Cadenhead,CMA

## 2014-08-17 NOTE — Telephone Encounter (Signed)
Marcelino Duster from [facility] Dept called and she needs a medication list and diagnoses codes for the patient to finish up the application. Please fax this to [phone]. Myriam Jacobson

## 2014-08-20 ENCOUNTER — Telehealth: Payer: Self-pay | Admitting: Family Medicine

## 2014-08-20 NOTE — Telephone Encounter (Signed)
Will forward to MD to see if he still wants patient to have this done.  If so a referral can be placed. Hildred Mollica,CMA

## 2014-08-20 NOTE — Telephone Encounter (Signed)
Pt was discharged from the hospital on 08/13/14, says a doctor mentioned having a colonoscopy but wasn't sure if the referral was made. Would like to know if she needs to do this and how she can get this set up.

## 2014-08-21 ENCOUNTER — Other Ambulatory Visit: Payer: Self-pay | Admitting: *Deleted

## 2014-08-21 MED ORDER — VITAMIN D 400 UNITS PO TABS: 800.0000 [IU] | ORAL_TABLET | Freq: Every day | ORAL | Status: AC

## 2014-08-21 NOTE — Telephone Encounter (Signed)
Not indicated.  Will discuss with her at upcoming visit

## 2014-08-22 ENCOUNTER — Ambulatory Visit (INDEPENDENT_AMBULATORY_CARE_PROVIDER_SITE_OTHER): Payer: Medicare Other | Admitting: Family Medicine

## 2014-08-22 ENCOUNTER — Encounter: Payer: Self-pay | Admitting: Family Medicine

## 2014-08-22 VITALS — BP 174/79 | HR 120 | Temp 98.0°F | Ht 60.0 in | Wt 127.0 lb

## 2014-08-22 DIAGNOSIS — K921 Melena: Secondary | ICD-10-CM

## 2014-08-22 DIAGNOSIS — Z515 Encounter for palliative care: Secondary | ICD-10-CM

## 2014-08-22 DIAGNOSIS — I10 Essential (primary) hypertension: Secondary | ICD-10-CM

## 2014-08-22 DIAGNOSIS — R630 Anorexia: Secondary | ICD-10-CM

## 2014-08-22 NOTE — Assessment & Plan Note (Signed)
Persists but weight is stable.  Most likely due to renal failure which she does not want treated.

## 2014-08-22 NOTE — Progress Notes (Signed)
   Subjective:    Patient ID: Frances [name], female    DOB: 1929-07-04, 80 y.o.   MRN: [id number]  HPI   Follow up hospitilization  GI Bleed - no further noticed bleeding.  No new lightheadness or chest pain or shortness of breath or edema.  Taking PPI  HYPERTENSION Disease Monitoring Home BP Monitoring not checking Chest pain- no    Dyspnea- no Medications Compliance-  Off all blood pressure specific medications since out of hospital. Lightheadedness-  no  Edema- trace no more than usual ROS - See HPI  PMH Lab Review   POTASSIUM  Date Value Ref Range Status  08/13/2014 6.0* 3.7 - 5.3 mEq/L Final   SODIUM  Date Value Ref Range Status  08/13/2014 137 137 - 147 mEq/L Final  08/23/2013 144 137 - 147 mmol/L Final   CREATININE  Date Value Ref Range Status  08/23/2013 2.9* 0.5 - 1.1 mg/dL Final   CREAT  Date Value Ref Range Status  10/17/2012 6.30* 0.50 - 1.10 mg/dL Final   CREATININE, SER  Date Value Ref Range Status  08/13/2014 3.10* 0.50 - 1.10 mg/dL Final      Appetite and Weight loss  She feels she has little appetite.  No nausea or vomiting no abdomen pain.  Her weight is stable  Chief Complaint noted Review of Symptoms - see HPI PMH - Smoking status noted.   Vital Signs reviewed     Review of Systems     Objective:   Physical Exam  Alert interactive Heart - Regular rate and rhythm.  No murmurs, gallops or rubs.    Lungs:  Normal respiratory effort, chest expands symmetrically. Lungs are clear to auscultation, no crackles or wheezes. Extrem - 1 + edema no skin breakdown       Assessment & Plan:

## 2014-08-22 NOTE — Patient Instructions (Signed)
Good to see you today!  Thanks for coming in.  Put the MOST form on your bed  Ask Drinda Butts to put tylenol 2 tabs twice daily  Come back in 1-2 months as needed  Call me with any problems

## 2014-08-22 NOTE — Assessment & Plan Note (Signed)
Does not seem active.  Given her wishes for mostly comfort care does not want any interventions.

## 2014-08-22 NOTE — Assessment & Plan Note (Signed)
Reaffirmed her wishes for comfort care (DNR and no dialysis or surgeries or invasive evaluations) and to remain at home as long as possible.  Completed a downloaded MOST form

## 2014-08-22 NOTE — Assessment & Plan Note (Signed)
Stable off all blood pressure medications will monitor.

## 2014-08-31 ENCOUNTER — Telehealth: Payer: Self-pay | Admitting: Family Medicine

## 2014-08-31 NOTE — Telephone Encounter (Signed)
Shelia from [facility] called to inform the doctor that the patient was approved for the CAP program as of 11/23 so they will provide all home health needs.Shelia just wanted to know does she need still visit the patient or turn this over to them. Please call [phone] to discuss. jw

## 2014-09-03 NOTE — Telephone Encounter (Signed)
Spoke with Velna Hatchet and she is aware of verbal order to discontinue care.  Jazmin Hartsell,CMA

## 2014-09-03 NOTE — Telephone Encounter (Signed)
As long as she is assured CAP will assume all needed care ok to stop

## 2014-09-06 ENCOUNTER — Telehealth: Payer: Self-pay | Admitting: Family Medicine

## 2014-09-06 NOTE — Telephone Encounter (Signed)
When in hospital, was mentioned she needed a colonoscopy.   Pt is now willing to get one. Please advise what the next step is

## 2014-09-08 NOTE — Telephone Encounter (Signed)
She should make an appointment to see me in January to discuss and schedule if she desiress

## 2014-09-10 NOTE — Telephone Encounter (Signed)
appt made for patient 10/10/2014.  She is aware and reminder card mailed. Jazmin Hartsell,CMA

## 2014-09-24 ENCOUNTER — Other Ambulatory Visit: Payer: Self-pay | Admitting: Family Medicine

## 2014-09-28 DIAGNOSIS — N184 Chronic kidney disease, stage 4 (severe): Secondary | ICD-10-CM | POA: Diagnosis not present

## 2014-09-29 DIAGNOSIS — N184 Chronic kidney disease, stage 4 (severe): Secondary | ICD-10-CM | POA: Diagnosis not present

## 2014-09-30 DIAGNOSIS — N184 Chronic kidney disease, stage 4 (severe): Secondary | ICD-10-CM | POA: Diagnosis not present

## 2014-10-01 DIAGNOSIS — N184 Chronic kidney disease, stage 4 (severe): Secondary | ICD-10-CM | POA: Diagnosis not present

## 2014-10-02 DIAGNOSIS — N184 Chronic kidney disease, stage 4 (severe): Secondary | ICD-10-CM | POA: Diagnosis not present

## 2014-10-03 DIAGNOSIS — N184 Chronic kidney disease, stage 4 (severe): Secondary | ICD-10-CM | POA: Diagnosis not present

## 2014-10-04 DIAGNOSIS — N184 Chronic kidney disease, stage 4 (severe): Secondary | ICD-10-CM | POA: Diagnosis not present

## 2014-10-05 DIAGNOSIS — N184 Chronic kidney disease, stage 4 (severe): Secondary | ICD-10-CM | POA: Diagnosis not present

## 2014-10-06 DIAGNOSIS — N184 Chronic kidney disease, stage 4 (severe): Secondary | ICD-10-CM | POA: Diagnosis not present

## 2014-10-07 DIAGNOSIS — N184 Chronic kidney disease, stage 4 (severe): Secondary | ICD-10-CM | POA: Diagnosis not present

## 2014-10-08 DIAGNOSIS — N184 Chronic kidney disease, stage 4 (severe): Secondary | ICD-10-CM | POA: Diagnosis not present

## 2014-10-09 DIAGNOSIS — N184 Chronic kidney disease, stage 4 (severe): Secondary | ICD-10-CM | POA: Diagnosis not present

## 2014-10-10 ENCOUNTER — Ambulatory Visit (INDEPENDENT_AMBULATORY_CARE_PROVIDER_SITE_OTHER): Payer: Medicare Other | Admitting: Family Medicine

## 2014-10-10 ENCOUNTER — Encounter: Payer: Self-pay | Admitting: Family Medicine

## 2014-10-10 VITALS — BP 171/76 | HR 103 | Temp 97.6°F | Ht 60.0 in | Wt 127.0 lb

## 2014-10-10 DIAGNOSIS — I10 Essential (primary) hypertension: Secondary | ICD-10-CM

## 2014-10-10 DIAGNOSIS — K921 Melena: Secondary | ICD-10-CM

## 2014-10-10 DIAGNOSIS — Z515 Encounter for palliative care: Secondary | ICD-10-CM

## 2014-10-10 DIAGNOSIS — R600 Localized edema: Secondary | ICD-10-CM

## 2014-10-10 DIAGNOSIS — N184 Chronic kidney disease, stage 4 (severe): Secondary | ICD-10-CM | POA: Diagnosis not present

## 2014-10-10 MED ORDER — FUROSEMIDE 20 MG PO TABS: 20.0000 mg | ORAL_TABLET | Freq: Two times a day (BID) | ORAL | Status: AC

## 2014-10-10 MED ORDER — NITROGLYCERIN 0.4 MG SL SUBL: 0.4000 mg | SUBLINGUAL_TABLET | SUBLINGUAL | Status: AC | PRN

## 2014-10-10 NOTE — Progress Notes (Signed)
   Subjective:    Patient ID: Frances [name], female    DOB: 15-Mar-1929, 81 y.o.   MRN: [id number]  HPI  Here for hospital follow up  GI bleeding - none that she has noticed.  Occasional mild abdomen pain none now.  No chest pain or lightheadness now.  Renal failure - mild intermittent swelling of legs. Taking lasix twice daily.  No shortness of breath.  Does have occasional cramps  HYPERTENSION Disease Monitoring Home BP Monitoring not checking Chest pain- none    Dyspnea- none new Medications Compliance-  Taking lasix daily no other blood pressure meds. Lightheadedness-  no  Edema- mild unchanged ROS - See HPI  PMH Lab Review   POTASSIUM  Date Value Ref Range Status  08/13/2014 6.0* 3.7 - 5.3 mEq/L Final   SODIUM  Date Value Ref Range Status  08/13/2014 137 137 - 147 mEq/L Final  08/23/2013 144 137 - 147 mmol/L Final   CREATININE  Date Value Ref Range Status  08/23/2013 2.9* 0.5 - 1.1 mg/dL Final   CREAT  Date Value Ref Range Status  10/17/2012 6.30* 0.50 - 1.10 mg/dL Final   CREATININE, SER  Date Value Ref Range Status  08/13/2014 3.10* 0.50 - 1.10 mg/dL Final           End of life - reiterates that she does not want dialysis or any resucitation.  Completed MOST form and will put in her refridge.  She does not get out much.  We discussed adult enrichment centers and PACE but she does not mind being at home  Chief Complaint noted Review of Symptoms - see HPI PMH - Smoking status noted.   Vital Signs reviewed    Review of Systems     Objective:   Physical Exam  Alert no acute distress Hard to understand but oriented - knows Drinda Butts her helper and her medications Heart - Regular rate and rhythm.  No murmurs, gallops or rubs.    Lungs:  Normal respiratory effort, chest expands symmetrically. Lungs are clear to auscultation, no crackles or wheezes. Extrem - 1 + edema bilaterally Walks on own with cane        Assessment & Plan:

## 2014-10-10 NOTE — Patient Instructions (Signed)
Good to see you today!  Thanks for coming in.  I think you are doing well  You do not need a colonoscopy if you are not having more bleeding  If you have pain or shortness of breath then call me [phone]  I sent in refills for furosemide and nitroglycerin  Come back in 1 month for a check up

## 2014-10-10 NOTE — Assessment & Plan Note (Signed)
Given her end of life wishes and risk of falls will continue current treatment and not attempt to meet any specific blood pressure levels

## 2014-10-10 NOTE — Assessment & Plan Note (Signed)
No signs of recurrence.  Will not trend hgb unless she has symptoms.  Never had significant drop

## 2014-10-10 NOTE — Assessment & Plan Note (Signed)
Reviewed her wishes.  No dialysis or resuscitation.    Use of antibiotics and fluids for limited times.  MOST form completed

## 2014-10-10 NOTE — Assessment & Plan Note (Signed)
Stable on current lasix

## 2014-10-11 DIAGNOSIS — N184 Chronic kidney disease, stage 4 (severe): Secondary | ICD-10-CM | POA: Diagnosis not present

## 2014-10-12 DIAGNOSIS — N184 Chronic kidney disease, stage 4 (severe): Secondary | ICD-10-CM | POA: Diagnosis not present

## 2014-10-13 DIAGNOSIS — N184 Chronic kidney disease, stage 4 (severe): Secondary | ICD-10-CM | POA: Diagnosis not present

## 2014-10-14 DIAGNOSIS — N184 Chronic kidney disease, stage 4 (severe): Secondary | ICD-10-CM | POA: Diagnosis not present

## 2014-10-15 DIAGNOSIS — N184 Chronic kidney disease, stage 4 (severe): Secondary | ICD-10-CM | POA: Diagnosis not present

## 2014-10-16 DIAGNOSIS — N184 Chronic kidney disease, stage 4 (severe): Secondary | ICD-10-CM | POA: Diagnosis not present

## 2014-10-17 DIAGNOSIS — N184 Chronic kidney disease, stage 4 (severe): Secondary | ICD-10-CM | POA: Diagnosis not present

## 2014-10-18 DIAGNOSIS — N184 Chronic kidney disease, stage 4 (severe): Secondary | ICD-10-CM | POA: Diagnosis not present

## 2014-10-19 DIAGNOSIS — N184 Chronic kidney disease, stage 4 (severe): Secondary | ICD-10-CM | POA: Diagnosis not present

## 2014-10-21 DIAGNOSIS — N184 Chronic kidney disease, stage 4 (severe): Secondary | ICD-10-CM | POA: Diagnosis not present

## 2014-10-22 ENCOUNTER — Telehealth: Payer: Self-pay | Admitting: Cardiology

## 2014-10-22 DIAGNOSIS — N184 Chronic kidney disease, stage 4 (severe): Secondary | ICD-10-CM | POA: Diagnosis not present

## 2014-10-22 NOTE — Telephone Encounter (Signed)
Received a call from [facility] case Production designer, theatre/television/film with [employer].She wanted to know patient's  last echo and EF.Last echo 10/11/14 with EF of 20 %.

## 2014-10-23 DIAGNOSIS — N184 Chronic kidney disease, stage 4 (severe): Secondary | ICD-10-CM | POA: Diagnosis not present

## 2014-10-24 DIAGNOSIS — N184 Chronic kidney disease, stage 4 (severe): Secondary | ICD-10-CM | POA: Diagnosis not present

## 2014-10-25 DIAGNOSIS — N184 Chronic kidney disease, stage 4 (severe): Secondary | ICD-10-CM | POA: Diagnosis not present

## 2014-10-26 DIAGNOSIS — N184 Chronic kidney disease, stage 4 (severe): Secondary | ICD-10-CM | POA: Diagnosis not present

## 2014-10-29 DIAGNOSIS — N184 Chronic kidney disease, stage 4 (severe): Secondary | ICD-10-CM | POA: Diagnosis not present

## 2014-10-30 DIAGNOSIS — N184 Chronic kidney disease, stage 4 (severe): Secondary | ICD-10-CM | POA: Diagnosis not present

## 2014-10-31 DIAGNOSIS — N184 Chronic kidney disease, stage 4 (severe): Secondary | ICD-10-CM | POA: Diagnosis not present

## 2014-11-01 DIAGNOSIS — N184 Chronic kidney disease, stage 4 (severe): Secondary | ICD-10-CM | POA: Diagnosis not present

## 2014-11-02 DIAGNOSIS — N184 Chronic kidney disease, stage 4 (severe): Secondary | ICD-10-CM | POA: Diagnosis not present

## 2014-11-03 DIAGNOSIS — N184 Chronic kidney disease, stage 4 (severe): Secondary | ICD-10-CM | POA: Diagnosis not present

## 2014-11-04 DIAGNOSIS — N184 Chronic kidney disease, stage 4 (severe): Secondary | ICD-10-CM | POA: Diagnosis not present

## 2014-11-05 DIAGNOSIS — N184 Chronic kidney disease, stage 4 (severe): Secondary | ICD-10-CM | POA: Diagnosis not present

## 2014-11-06 DIAGNOSIS — N184 Chronic kidney disease, stage 4 (severe): Secondary | ICD-10-CM | POA: Diagnosis not present

## 2014-11-07 DIAGNOSIS — N184 Chronic kidney disease, stage 4 (severe): Secondary | ICD-10-CM | POA: Diagnosis not present

## 2014-11-08 ENCOUNTER — Telehealth: Payer: Self-pay | Admitting: Clinical

## 2014-11-08 DIAGNOSIS — N184 Chronic kidney disease, stage 4 (severe): Secondary | ICD-10-CM | POA: Diagnosis not present

## 2014-11-08 NOTE — Telephone Encounter (Signed)
CSW received a call from [facility] CSW inquiring as to whether pt could receive additional hours for PCS while receiving CAP services.  CSW contacted Ms. Adrian Blackwater with [employer] [facility] ([facility]). CSW was informed that pt is currently only receiving a few hours of Personal Care Services [facility]) a day. CAP is currently evaluating whether pt will qualify for CAP services as she does not have a "back up plan" in the event that her nursing assistant is unable to care for her. Unfortunately pt does not have any other support systems who could assist her in the even of an emergency. CAP is exploring other options at this moment. CSW was informed that a request for additional hours could be completed through [facility].  CSW has placed an application for PCS in PCP's box for completion.  Theresia Bough, MSW, LCSW [phone]

## 2014-11-09 DIAGNOSIS — N184 Chronic kidney disease, stage 4 (severe): Secondary | ICD-10-CM | POA: Diagnosis not present

## 2014-11-10 DIAGNOSIS — N184 Chronic kidney disease, stage 4 (severe): Secondary | ICD-10-CM | POA: Diagnosis not present

## 2014-11-11 DIAGNOSIS — N184 Chronic kidney disease, stage 4 (severe): Secondary | ICD-10-CM | POA: Diagnosis not present

## 2014-11-12 ENCOUNTER — Ambulatory Visit: Payer: Medicare Other | Admitting: Podiatry

## 2014-11-12 DIAGNOSIS — N184 Chronic kidney disease, stage 4 (severe): Secondary | ICD-10-CM | POA: Diagnosis not present

## 2014-11-13 DIAGNOSIS — N184 Chronic kidney disease, stage 4 (severe): Secondary | ICD-10-CM | POA: Diagnosis not present

## 2014-11-14 ENCOUNTER — Encounter: Payer: Self-pay | Admitting: Clinical

## 2014-11-14 DIAGNOSIS — N184 Chronic kidney disease, stage 4 (severe): Secondary | ICD-10-CM | POA: Diagnosis not present

## 2014-11-14 NOTE — Progress Notes (Signed)
CSW has faxed the [facility] form for additional hours to [employer].  Theresia Bough, MSW, LCSW [phone]

## 2014-11-15 DIAGNOSIS — N184 Chronic kidney disease, stage 4 (severe): Secondary | ICD-10-CM | POA: Diagnosis not present

## 2014-11-16 DIAGNOSIS — N184 Chronic kidney disease, stage 4 (severe): Secondary | ICD-10-CM | POA: Diagnosis not present

## 2014-11-17 DIAGNOSIS — N184 Chronic kidney disease, stage 4 (severe): Secondary | ICD-10-CM | POA: Diagnosis not present

## 2014-11-18 DIAGNOSIS — N184 Chronic kidney disease, stage 4 (severe): Secondary | ICD-10-CM | POA: Diagnosis not present

## 2014-11-19 DIAGNOSIS — N184 Chronic kidney disease, stage 4 (severe): Secondary | ICD-10-CM | POA: Diagnosis not present

## 2014-11-20 ENCOUNTER — Telehealth: Payer: Self-pay | Admitting: Clinical

## 2014-11-20 DIAGNOSIS — N184 Chronic kidney disease, stage 4 (severe): Secondary | ICD-10-CM | POA: Diagnosis not present

## 2014-11-20 NOTE — Telephone Encounter (Signed)
CSW informed by a front office staff that pt called and report chest discomfort and dizziness, with slurred speech. Pt was encouraged to go to the hospital however pt refused. CSW called pt to see whether CSW could provide assistance. Pt still presented with slurred speech (CSW is not sure whether this is her baseline). Pt reported feeling better than yesterday and will try to come to her appointment tomorrow. CSW encouraged pt to consider coming to the hospital if she declines/does not feel well. CSW informed pt that CSW would contact her [facility] CSW, Crystal. Pt denied any other concerns. CSW left a message for Crystal informing her of the above and inquiring whether she could do a home visit or contact pt.  Theresia Bough, MSW, LCSW [phone]

## 2014-11-21 ENCOUNTER — Ambulatory Visit: Payer: Medicare Other | Admitting: Family Medicine

## 2014-11-21 ENCOUNTER — Emergency Department (HOSPITAL_COMMUNITY): Payer: Medicare Other

## 2014-11-21 ENCOUNTER — Emergency Department (HOSPITAL_COMMUNITY)
Admission: EM | Admit: 2014-11-21 | Discharge: 2014-11-21 | Disposition: A | Discharge status: Home / Self Care | Payer: Medicare Other | Attending: Emergency Medicine | Admitting: Emergency Medicine

## 2014-11-21 ENCOUNTER — Encounter (HOSPITAL_COMMUNITY): Payer: Self-pay | Admitting: Radiology

## 2014-11-21 DIAGNOSIS — Z87891 Personal history of nicotine dependence: Secondary | ICD-10-CM | POA: Diagnosis not present

## 2014-11-21 DIAGNOSIS — R404 Transient alteration of awareness: Secondary | ICD-10-CM | POA: Diagnosis not present

## 2014-11-21 DIAGNOSIS — N289 Disorder of kidney and ureter, unspecified: Secondary | ICD-10-CM | POA: Diagnosis not present

## 2014-11-21 DIAGNOSIS — I2510 Atherosclerotic heart disease of native coronary artery without angina pectoris: Secondary | ICD-10-CM | POA: Insufficient documentation

## 2014-11-21 DIAGNOSIS — Z8639 Personal history of other endocrine, nutritional and metabolic disease: Secondary | ICD-10-CM | POA: Insufficient documentation

## 2014-11-21 DIAGNOSIS — N184 Chronic kidney disease, stage 4 (severe): Secondary | ICD-10-CM | POA: Diagnosis not present

## 2014-11-21 DIAGNOSIS — Z79899 Other long term (current) drug therapy: Secondary | ICD-10-CM | POA: Diagnosis not present

## 2014-11-21 DIAGNOSIS — I10 Essential (primary) hypertension: Secondary | ICD-10-CM | POA: Diagnosis not present

## 2014-11-21 DIAGNOSIS — Z9889 Other specified postprocedural states: Secondary | ICD-10-CM | POA: Diagnosis not present

## 2014-11-21 DIAGNOSIS — R471 Dysarthria and anarthria: Secondary | ICD-10-CM | POA: Diagnosis not present

## 2014-11-21 DIAGNOSIS — I509 Heart failure, unspecified: Secondary | ICD-10-CM | POA: Insufficient documentation

## 2014-11-21 DIAGNOSIS — I129 Hypertensive chronic kidney disease with stage 1 through stage 4 chronic kidney disease, or unspecified chronic kidney disease: Secondary | ICD-10-CM | POA: Diagnosis not present

## 2014-11-21 DIAGNOSIS — Z8719 Personal history of other diseases of the digestive system: Secondary | ICD-10-CM | POA: Insufficient documentation

## 2014-11-21 DIAGNOSIS — Z87440 Personal history of urinary (tract) infections: Secondary | ICD-10-CM | POA: Diagnosis not present

## 2014-11-21 DIAGNOSIS — R29810 Facial weakness: Secondary | ICD-10-CM | POA: Diagnosis not present

## 2014-11-21 DIAGNOSIS — R531 Weakness: Secondary | ICD-10-CM | POA: Insufficient documentation

## 2014-11-21 DIAGNOSIS — K922 Gastrointestinal hemorrhage, unspecified: Secondary | ICD-10-CM | POA: Diagnosis not present

## 2014-11-21 DIAGNOSIS — Z8673 Personal history of transient ischemic attack (TIA), and cerebral infarction without residual deficits: Secondary | ICD-10-CM | POA: Insufficient documentation

## 2014-11-21 DIAGNOSIS — M6281 Muscle weakness (generalized): Secondary | ICD-10-CM | POA: Diagnosis not present

## 2014-11-21 LAB — COMPREHENSIVE METABOLIC PANEL
ALT: 9 U/L (ref 0–35)
AST: 13 U/L (ref 0–37)
Albumin: 3.9 g/dL (ref 3.5–5.2)
Alkaline Phosphatase: 75 U/L (ref 39–117)
Anion gap: 11 (ref 5–15)
BUN: 70 mg/dL — AB (ref 6–23)
CALCIUM: 9.2 mg/dL (ref 8.4–10.5)
CO2: 18 mmol/L — ABNORMAL LOW (ref 19–32)
CREATININE: 4.41 mg/dL — AB (ref 0.50–1.10)
Chloride: 109 mmol/L (ref 96–112)
GFR calc non Af Amer: 8 mL/min — ABNORMAL LOW (ref >90)
GFR, EST AFRICAN AMERICAN: 10 mL/min — AB (ref >90)
Glucose, Bld: 62 mg/dL — ABNORMAL LOW (ref 70–99)
POTASSIUM: 5.1 mmol/L (ref 3.5–5.1)
Sodium: 138 mmol/L (ref 135–145)
Total Bilirubin: 0.3 mg/dL (ref 0.3–1.2)
Total Protein: 7.4 g/dL (ref 6.0–8.3)

## 2014-11-21 LAB — BRAIN NATRIURETIC PEPTIDE: B NATRIURETIC PEPTIDE 5: 18.9 pg/mL (ref 0.0–100.0)

## 2014-11-21 LAB — URINE MICROSCOPIC-ADD ON

## 2014-11-21 LAB — URINALYSIS, ROUTINE W REFLEX MICROSCOPIC
Bilirubin Urine: NEGATIVE
Glucose, UA: NEGATIVE mg/dL
Ketones, ur: NEGATIVE mg/dL
NITRITE: NEGATIVE
PH: 5 (ref 5.0–8.0)
Protein, ur: NEGATIVE mg/dL
SPECIFIC GRAVITY, URINE: 1.013 (ref 1.005–1.030)
UROBILINOGEN UA: 0.2 mg/dL (ref 0.0–1.0)

## 2014-11-21 LAB — PROTIME-INR
INR: 1.06 (ref 0.00–1.49)
Prothrombin Time: 13.9 seconds (ref 11.6–15.2)

## 2014-11-21 LAB — CBC WITH DIFFERENTIAL/PLATELET
Basophils Absolute: 0 10*3/uL (ref 0.0–0.1)
Basophils Relative: 0 % (ref 0–1)
Eosinophils Absolute: 0.1 10*3/uL (ref 0.0–0.7)
Eosinophils Relative: 1 % (ref 0–5)
HCT: 28.2 % — ABNORMAL LOW (ref 36.0–46.0)
HEMOGLOBIN: 8.8 g/dL — AB (ref 12.0–15.0)
LYMPHS ABS: 0.9 10*3/uL (ref 0.7–4.0)
Lymphocytes Relative: 17 % (ref 12–46)
MCH: 27.5 pg (ref 26.0–34.0)
MCHC: 31.2 g/dL (ref 30.0–36.0)
MCV: 88.1 fL (ref 78.0–100.0)
MONO ABS: 0.8 10*3/uL (ref 0.1–1.0)
MONOS PCT: 13 % — AB (ref 3–12)
NEUTROS ABS: 3.9 10*3/uL (ref 1.7–7.7)
Neutrophils Relative %: 69 % (ref 43–77)
Platelets: 202 10*3/uL (ref 150–400)
RBC: 3.2 MIL/uL — AB (ref 3.87–5.11)
RDW: 13.5 % (ref 11.5–15.5)
WBC: 5.7 10*3/uL (ref 4.0–10.5)

## 2014-11-21 LAB — I-STAT CHEM 8, ED
BUN: 64 mg/dL — ABNORMAL HIGH (ref 6–23)
Calcium, Ion: 1.26 mmol/L (ref 1.13–1.30)
Chloride: 110 mmol/L (ref 96–112)
Creatinine, Ser: 4.7 mg/dL — ABNORMAL HIGH (ref 0.50–1.10)
Glucose, Bld: 83 mg/dL (ref 70–99)
HCT: 31 % — ABNORMAL LOW (ref 36.0–46.0)
HEMOGLOBIN: 10.5 g/dL — AB (ref 12.0–15.0)
Potassium: 5.2 mmol/L — ABNORMAL HIGH (ref 3.5–5.1)
Sodium: 140 mmol/L (ref 135–145)
TCO2: 17 mmol/L (ref 0–100)

## 2014-11-21 MED ORDER — DEXTROSE 5 % IV SOLN
1.0000 g | Freq: Once | INTRAVENOUS | Status: AC
  Administered 2014-11-21: 1 g via INTRAVENOUS
  Filled 2014-11-21: qty 10

## 2014-11-21 MED ORDER — SODIUM POLYSTYRENE SULFONATE 15 GM/60ML PO SUSP
30.0000 g | Freq: Once | ORAL | Status: AC
  Administered 2014-11-21: 30 g via ORAL
  Filled 2014-11-21: qty 120

## 2014-11-21 NOTE — Discharge Instructions (Signed)
Return to the ED with any concerns including chest pain, fainting, vomiting and not able to keep down liquids, weakness of arms or legs, fever/chills, decreased level of alertness/lethargy, or any other alarming symptoms

## 2014-11-21 NOTE — ED Notes (Signed)
Called patient's care giver, [name] Barns, Ms. Barns states that Patient was brought in due to weakness and not feeling well over the past 2 days. Ms. Barns states that the patient felt too weak to get up this morning. Ms. Barns denies patient having any onesided weakness or facial droop.

## 2014-11-21 NOTE — ED Notes (Signed)
Called patient's care give [name]. Cargiver stated that she would be here in 20 minutes

## 2014-11-21 NOTE — ED Notes (Signed)
Pt presents from home via [facility] with c/o weakness. Per EMS pt's caregiver arrived at the patient's house today to take her to a doctor's appt and patient was unable to stand up and walk (pt normally ambualtory). Pt has right grip weakness, right sided facial droop, and her right pupil is 2-82mm while the left is 4-16mm. Pt has dysarthria at baseline. [name] LSN was yesterday at 1400. Pt is oriented at baseline and alert - appears in NAD.

## 2014-11-21 NOTE — ED Provider Notes (Signed)
CSN: [id number]     Arrival date & time 11/21/14  1357 History   First MD Initiated Contact with Patient 11/21/14 1503     Chief Complaint  Patient presents with  . Weakness     (Consider location/radiation/quality/duration/timing/severity/associated sxs/prior Treatment) HPI  A LEVEL 5 CAVEAT PERTAINS DUE TO DYSARTHRIA.  Pt presents with concern for caregiver who saw her today and she was weaker than normal.  She is normally ambulatory, but today was weak, not able to stand up and walk.  Pt has dysarthria at baseline.  Caregiver felt that she had generalized weakness more than her baseline today.    Past Medical History  Diagnosis Date  . Coronary artery disease, non-occlusive 2005    Last cath in 2005 - no significant disease; Myoview 2009 no ischemia  . Hypertension - labile   . Renal disorder   . UTI (lower urinary tract infection) 10/10/2012  . Stroke   . CKD (chronic kidney disease), stage IV   . Speech impediment   . Dyslipidemia   . Non-ischemic cardiomyopathy     Echo 09/2012:EF <20% with severe global hypokinesis (down from ~45% in 2012), Aortic Sclerosis.  Trivial pericardial effusion; Pt declined furhter investigation, Myoview if + would lead to cath & she does not want cath.  . Tachycardia-bradycardia     Declines PPM  . Paresthesia     fingers  . Dizziness   . CHF (congestive heart failure)   . GI bleed 08/09/2014   Past Surgical History  Procedure Laterality Date  . Abdominal surgery      midline abdominal scar   . Knee surgery      both knees   . Renal doppler  05/02/2005    Non-specific medical renal disease with bilateral renal cysts. Otherwise no significant abnormality with no obstructive uropathy.  . Cardiac catheterization Left 06/11/2004    Angiographically patent coronary arteries, normal LV end-diastolic pressure.  . Cardiovascular stress test  05/22/2008    Perfusion defect in inferior myocardial region is consistent with diaphragmatic attenuation.  Remaining myocardium demonstrates normal myocardial perfusion with no evidence of ischemia of infarct. No ECG changes. EKG negative for ischemia.  . Transthoracic echocardiogram  10/11/2012    EF <20%, moderate concentric hypertrophy, grade 1 diastolic dysfunction, aortic valve sclerodid (not stenosis).   Family History  Problem Relation Age of Onset  . Heart attack Daughter    History  Substance Use Topics  . Smoking status: Former Smoker    Types: Cigarettes    Start date: 05/16/1993  . Smokeless tobacco: Never Used  . Alcohol Use: No   OB History    No data available     Review of Systems  UNABLE TO OBTAIN ROS DUE TO LEVEL 5 CAVEAT    Allergies  Catapres  Home Medications   Prior to Admission medications   Medication Sig Start Date End Date Taking? Authorizing Provider  acetaminophen (TYLENOL) 500 MG tablet Take 1,000 mg by mouth 2 (two) times daily.    Historical Provider, MD  cinacalcet (SENSIPAR) 60 MG tablet Take 60 mg by mouth daily.      Historical Provider, MD  furosemide (LASIX) 20 MG tablet Take 1 tablet (20 mg total) by mouth 2 (two) times daily. 10/10/14   Carney Living, MD  nitroGLYCERIN (NITROSTAT) 0.4 MG SL tablet Place 1 tablet (0.4 mg total) under the tongue every 5 (five) minutes as needed for chest pain. 10/10/14   Carney Living, MD  omeprazole (  PRILOSEC) 20 MG capsule TAKE (1) CAPSULE DAILY. 09/24/14   Carney Living, MD  traMADol (ULTRAM) 50 MG tablet Take 50 mg by mouth every 6 (six) hours as needed for moderate pain. 04/03/14   Carney Living, MD  vitamin D, CHOLECALCIFEROL, 400 UNITS tablet Take 2 tablets (800 Units total) by mouth daily. 08/21/14   Carney Living, MD   BP 139/59 mmHg  Pulse 76  Temp(Src) 97.5 F (36.4 C) (Oral)  Resp 23  SpO2 98%  Vitals reviewed Physical Exam  Physical Examination: General appearance - alert, chronically ill appearing, and in no distress Mental status - alert, oriented to person,  difficult to asssess further orientation due to dysarthria Eyes - pupils equal and reactive, extraocular eye movements intact Mouth - mucous membranes moist, pharynx normal without lesions Chest - clear to auscultation, no wheezes, rales or rhonchi, symmetric air entry Heart - normal rate, regular rhythm, normal S1, S2, no murmurs, rubs, clicks or gallops Abdomen - soft, nontender, nondistended, no masses or organomegaly Neurological - alert, oriented, dysarthric speech, otherwise cranial nerves 2-12 intact, strength 5/5 in extremities, sensation intact Extremities - peripheral pulses normal, no pedal edema, no clubbing or cyanosis Skin - normal coloration and turgor, no rashes  ED Course  Procedures (including critical care time)  8:15 PM per chart review, patient is DNR, she is comfort/palliative care only.  Has stated she does not want dialysis or any signficant interventions.  Pt has some mild increase in renal insufficiency over her baseline.  Very mild hyperkalemia- normal on one set of labs today, and 5.2 on another lab.  Will give dose of kayexalte.  D/w family medicine residents, they are going to set patient up for followup appointment tomorrow in clinic.  Pt does not wish to be admitted.   Labs Review Labs Reviewed  URINALYSIS, ROUTINE W REFLEX MICROSCOPIC - Abnormal; Notable for the following:    APPearance CLOUDY (*)    Hgb urine dipstick TRACE (*)    Leukocytes, UA LARGE (*)    All other components within normal limits  CBC WITH DIFFERENTIAL/PLATELET - Abnormal; Notable for the following:    RBC 3.20 (*)    Hemoglobin 8.8 (*)    HCT 28.2 (*)    Monocytes Relative 13 (*)    All other components within normal limits  COMPREHENSIVE METABOLIC PANEL - Abnormal; Notable for the following:    CO2 18 (*)    Glucose, Bld 62 (*)    BUN 70 (*)    Creatinine, Ser 4.41 (*)    GFR calc non Af Amer 8 (*)    GFR calc Af Amer 10 (*)    All other components within normal limits  URINE  MICROSCOPIC-ADD ON - Abnormal; Notable for the following:    Squamous Epithelial / LPF MANY (*)    Bacteria, UA FEW (*)    All other components within normal limits  I-STAT CHEM 8, ED - Abnormal; Notable for the following:    Potassium 5.2 (*)    BUN 64 (*)    Creatinine, Ser 4.70 (*)    Hemoglobin 10.5 (*)    HCT 31.0 (*)    All other components within normal limits  URINE CULTURE  PROTIME-INR  BRAIN NATRIURETIC PEPTIDE    Imaging Review Ct Head Wo Contrast  11/21/2014   CLINICAL DATA:  Right grip weakness, right facial droop, and asymmetric pupils. Baseline dysarthria.  EXAM: CT HEAD WITHOUT CONTRAST  TECHNIQUE: Contiguous axial images were  obtained from the base of the skull through the vertex without intravenous contrast.  COMPARISON:  04/02/2011  FINDINGS: No evidence of parenchymal hemorrhage or extra-axial fluid collection. No mass lesion, mass effect, or midline shift.  No CT evidence of acute infarction.  Old right thalamic lacunar infarct.  Subcortical white matter and periventricular small vessel ischemic changes. Intracranial atherosclerosis.  Global cortical atrophy.  No ventriculomegaly.  The visualized paranasal sinuses are essentially clear. The mastoid air cells are unopacified.  No evidence of calvarial fracture.  IMPRESSION: No evidence of acute intracranial abnormality.  Old right thalamic lacunar infarct.  Atrophy with small vessel ischemic changes and intracranial atherosclerosis.   Electronically Signed   By: Charline Bills M.D.   On: 11/21/2014 15:32   Dg Chest Port 1 View  11/21/2014   CLINICAL DATA:  Gastrointestinal bleeding  EXAM: PORTABLE CHEST - 1 VIEW  COMPARISON:  August 09, 2014  FINDINGS: The lungs are clear. Heart is mildly enlarged with pulmonary vascularity within normal limits. No adenopathy. There is atherosclerotic change in the aorta.  IMPRESSION: Mild cardiac enlargement.  No edema or consolidation.   Electronically Signed   By: Bretta Bang  III M.D.   On: 11/21/2014 15:30     EKG Interpretation   Date/Time:  Wednesday November 21 2014 14:22:16 EST Ventricular Rate:  81 PR Interval:  160 QRS Duration: 102 QT Interval:  375 QTC Calculation: 435 R Axis:   50 Text Interpretation:  Sinus rhythm Probable LVH with secondary repol abnrm  No significant change since last tracing Confirmed by [facility]  MD, MARTHA  [phone]) on 11/21/2014 3:37:16 PM      MDM   Final diagnoses:  Generalized weakness  Renal insufficiency    Pt presenting with c/o generalized weakness.  See notes above.  Pt has appointment set for her to see family medicine tomorrow at 11:30am.  Pt does not wish to be admitted.     Ethelda Chick, MD 11/22/14 [phone]

## 2014-11-21 NOTE — ED Notes (Signed)
[address] [place], [phone], call if discharged.

## 2014-11-22 ENCOUNTER — Ambulatory Visit: Payer: Medicare Other | Admitting: Family Medicine

## 2014-11-22 ENCOUNTER — Telehealth: Payer: Self-pay | Admitting: Family Medicine

## 2014-11-22 DIAGNOSIS — N184 Chronic kidney disease, stage 4 (severe): Secondary | ICD-10-CM | POA: Diagnosis not present

## 2014-11-22 LAB — URINE CULTURE

## 2014-11-22 NOTE — Telephone Encounter (Signed)
Spoke with Frances [name] who feels Frances [name] is doing as usual this AM.  No pain and acting normally

## 2014-11-23 ENCOUNTER — Other Ambulatory Visit: Payer: Self-pay | Admitting: Family Medicine

## 2014-11-23 ENCOUNTER — Ambulatory Visit: Payer: Medicare Other | Admitting: Family Medicine

## 2014-11-23 ENCOUNTER — Telehealth: Payer: Self-pay | Admitting: *Deleted

## 2014-11-23 DIAGNOSIS — I5022 Chronic systolic (congestive) heart failure: Secondary | ICD-10-CM | POA: Diagnosis not present

## 2014-11-23 DIAGNOSIS — Z515 Encounter for palliative care: Secondary | ICD-10-CM

## 2014-11-23 DIAGNOSIS — I429 Cardiomyopathy, unspecified: Secondary | ICD-10-CM | POA: Diagnosis not present

## 2014-11-23 DIAGNOSIS — R600 Localized edema: Secondary | ICD-10-CM

## 2014-11-23 DIAGNOSIS — N184 Chronic kidney disease, stage 4 (severe): Secondary | ICD-10-CM

## 2014-11-23 NOTE — Assessment & Plan Note (Signed)
She is interested in having hospice care as long as she can continue to be cared for by her long time and devoted aide Ms Zachery Dauer

## 2014-11-23 NOTE — Assessment & Plan Note (Signed)
Stable on current medications.  Having occasional cramps

## 2014-11-23 NOTE — Assessment & Plan Note (Signed)
Stable contrlled on her current regimne

## 2014-11-23 NOTE — Assessment & Plan Note (Signed)
Stable

## 2014-11-23 NOTE — Progress Notes (Signed)
   Subjective:    Patient ID: Frances [name], female    DOB: [birth year], 81 y.o.   MRN: [id number]  HPI  Home visit on 11-23-12 at 86 AM  Met with patient and home aide Ms Drema [place]  Currently patient reported no pain  Chest Pain recently in ER.  Has not had any since nor has used NTG.  No shortness of breath or changed in her limited mobiltiy  Renal Failure - no shortness of breath over her baseline.  Mild edema but none new.  Taking her lasix as ordered   Heart Failure Taking lasix daily.  No other medications due to her blood pressure fluctuatione.   No increase in baseline shortness of breath or edema  EOL She does not wish to die but understands she likely has less than 6 months to live but that this is hard to predict.  She adamantly does not want care in the hospital nor does she want dialysis  Chief Complaint noted Review of Symptoms - see HPI PMH - Smoking status noted.   Vital Signs reviewed  Review of Systems     Objective:   Physical Exam  Alert sitting in chair in bedroom watching TV.  Able to stand and walk slowly to bathroom without assist Extrem - trace to 1+ edema bilateraly No audible rales or crackles on lung exam      Assessment & Plan:

## 2014-11-23 NOTE — Assessment & Plan Note (Signed)
Intermmittent chest pain might be perfusion related.  Discussed using ntg and to be sure to be sitting and remain sitting if uses

## 2014-11-24 DIAGNOSIS — N184 Chronic kidney disease, stage 4 (severe): Secondary | ICD-10-CM | POA: Diagnosis not present

## 2014-11-25 DIAGNOSIS — N184 Chronic kidney disease, stage 4 (severe): Secondary | ICD-10-CM | POA: Diagnosis not present

## 2014-11-26 ENCOUNTER — Encounter: Payer: Self-pay | Admitting: Podiatry

## 2014-11-26 ENCOUNTER — Ambulatory Visit (INDEPENDENT_AMBULATORY_CARE_PROVIDER_SITE_OTHER): Payer: Medicare Other | Admitting: Podiatry

## 2014-11-26 DIAGNOSIS — B351 Tinea unguium: Secondary | ICD-10-CM | POA: Diagnosis not present

## 2014-11-26 DIAGNOSIS — M79676 Pain in unspecified toe(s): Secondary | ICD-10-CM | POA: Diagnosis not present

## 2014-11-27 DIAGNOSIS — N184 Chronic kidney disease, stage 4 (severe): Secondary | ICD-10-CM | POA: Diagnosis not present

## 2014-11-27 NOTE — Progress Notes (Signed)
Patient ID: Frances [name], female   DOB: 1929-02-20, 81 y.o.   MRN: [id number]  Subjective: This patient presents complaining of painful toenails  Objective: The toenails are hypertrophic, elongated, incurvated and tender to palpation 6-10 Plantar keratoses first MPJ bilaterally  Assessment: Symptomatic onychomycoses 6-10 Plantar keratoses 2  Plan: Nails 10 and keratoses 2 debrided without a bleeding  Reappoint 3 months

## 2014-11-28 DIAGNOSIS — N184 Chronic kidney disease, stage 4 (severe): Secondary | ICD-10-CM | POA: Diagnosis not present

## 2014-11-29 DIAGNOSIS — N184 Chronic kidney disease, stage 4 (severe): Secondary | ICD-10-CM | POA: Diagnosis not present

## 2014-11-30 DIAGNOSIS — N184 Chronic kidney disease, stage 4 (severe): Secondary | ICD-10-CM | POA: Diagnosis not present

## 2014-12-01 DIAGNOSIS — N184 Chronic kidney disease, stage 4 (severe): Secondary | ICD-10-CM | POA: Diagnosis not present

## 2014-12-02 DIAGNOSIS — N184 Chronic kidney disease, stage 4 (severe): Secondary | ICD-10-CM | POA: Diagnosis not present

## 2014-12-03 DIAGNOSIS — N184 Chronic kidney disease, stage 4 (severe): Secondary | ICD-10-CM | POA: Diagnosis not present

## 2014-12-04 ENCOUNTER — Telehealth: Payer: Self-pay | Admitting: Clinical

## 2014-12-04 DIAGNOSIS — N184 Chronic kidney disease, stage 4 (severe): Secondary | ICD-10-CM | POA: Diagnosis not present

## 2014-12-04 NOTE — Telephone Encounter (Signed)
CSW received a fax from [facility] stating that pts case will be closed as pts goals have been met. Per the [facility] CSW, pt is receiving Hospice and CAP services.  Hunt Oris, MSW, [place]

## 2014-12-05 ENCOUNTER — Encounter: Payer: Self-pay | Admitting: Family Medicine

## 2014-12-05 ENCOUNTER — Ambulatory Visit (INDEPENDENT_AMBULATORY_CARE_PROVIDER_SITE_OTHER): Admitting: Family Medicine

## 2014-12-05 VITALS — BP 171/74 | HR 98 | Temp 98.1°F | Ht 60.0 in | Wt 129.1 lb

## 2014-12-05 DIAGNOSIS — N184 Chronic kidney disease, stage 4 (severe): Secondary | ICD-10-CM | POA: Diagnosis not present

## 2014-12-05 DIAGNOSIS — M25512 Pain in left shoulder: Secondary | ICD-10-CM | POA: Diagnosis present

## 2014-12-05 MED ORDER — METHYLPREDNISOLONE ACETATE 40 MG/ML IJ SUSP
40.0000 mg | Freq: Once | INTRAMUSCULAR | Status: AC
  Administered 2014-12-05: 40 mg via INTRA_ARTICULAR

## 2014-12-05 NOTE — Patient Instructions (Signed)
Good to see you today!  Thanks for coming in.  Your shoulder should feel better in 2 days or so  If worsening or if you have fever call immediately  We may need to get an xray if you are not better  Take tylenol or tramadol or oxycodone as needed for the pain

## 2014-12-05 NOTE — Progress Notes (Signed)
   Subjective:    Patient ID: Frances [name], female    DOB: [birth year], 81 y.o.   MRN: [id number]  HPI  JOINT PAIN Location: left shoulder.   Course:  Worsened over last 5-10 days Worse with:  movement Better with:  Rest and tylenol.  Oxycodone from hospice dr does not help Trauma: no fall or hitting Swelling:  no Locking:  no Other Joints involved:  no Rash: no Fever:  No  Chief Complaint noted Review of Symptoms - see HPI PMH - Smoking status noted.   Vital Signs reviewed     Review of Systems     Objective:   Physical Exam  Left shoulder - tender with any range of motion more than 15 degrees. Distal sensation and strength intact No pain with wrist or elbow range of motion Lungs:  Normal respiratory effort, chest expands symmetrically. Lungs are clear to auscultation, no crackles or wheezes.   Procedure Procedure: left shoulder Consent signed and scanned into record. Medication:  40 mg cc depomedrol  2 cc Lidocaine !% without epi Preparation: area cleansed with alcohol and betadine Time Out taken  Injection  Landmarks identified 3 cc of medication injected into joint space using a posterior approach Patient tolerated well without bleeding or paresthesias  Patient had significatnly improved range of motion of joint after injection      Assessment & Plan:    Left Shoulder Pain Consistent with DJD flare but could be focal neoplasm.  No signs of infection.  Given her hospice status will inject but will get an xray if does not help.  Continue tylenol

## 2014-12-06 DIAGNOSIS — N184 Chronic kidney disease, stage 4 (severe): Secondary | ICD-10-CM | POA: Diagnosis not present

## 2014-12-07 DIAGNOSIS — N184 Chronic kidney disease, stage 4 (severe): Secondary | ICD-10-CM | POA: Diagnosis not present

## 2014-12-24 ENCOUNTER — Other Ambulatory Visit: Payer: Self-pay | Admitting: Family Medicine

## 2014-12-24 NOTE — Telephone Encounter (Signed)
Prescription called in on pharmacy refill line. Jazmin Hartsell,CMA

## 2014-12-24 NOTE — Telephone Encounter (Signed)
Please call in Tramadol Thanks LC

## 2014-12-25 ENCOUNTER — Telehealth: Payer: Self-pay | Admitting: Family Medicine

## 2014-12-25 DIAGNOSIS — M25512 Pain in left shoulder: Secondary | ICD-10-CM | POA: Insufficient documentation

## 2014-12-25 NOTE — Telephone Encounter (Signed)
Frances [name] called today left shoulder - still painful - will get xray Boil in groin - burst several days ago.  Hospice nurse dressed yesterday No fever or pain Told to contact us and come for visit if worsening She agreed

## 2014-12-26 ENCOUNTER — Ambulatory Visit
Admission: RE | Admit: 2014-12-26 | Discharge: 2014-12-26 | Disposition: A | Discharge status: Home / Self Care | Source: Ambulatory Visit | Attending: Family Medicine | Admitting: Family Medicine

## 2014-12-26 DIAGNOSIS — M25512 Pain in left shoulder: Secondary | ICD-10-CM

## 2015-01-02 ENCOUNTER — Telehealth: Payer: Self-pay | Admitting: Family Medicine

## 2015-01-02 NOTE — Telephone Encounter (Signed)
Told shoulder xray ok Still having pain  Recommend take 2 tramadol at once. If not helping to call back  She agrees

## 2015-01-14 ENCOUNTER — Encounter: Payer: Self-pay | Admitting: Pharmacist

## 2015-01-14 ENCOUNTER — Telehealth: Payer: Self-pay | Admitting: Family Medicine

## 2015-01-14 NOTE — Telephone Encounter (Signed)
Frances [name] is confused and hallucinating.  No sob or pain. No new medications Was seen by hospice nurse this AM Robinette Haines [phone]    Asked Drinda Butts to stop the tramadol and give only Oxycodine 1/2 tab bid can increase to 1 bid  She agrees

## 2015-01-14 NOTE — Telephone Encounter (Signed)
Called [facility] [phone] and left message to call me  Called back.  When she saw her this AM did not seem confused and had stable vs  Will stop tramadol and monitor

## 2015-01-14 NOTE — Progress Notes (Unsigned)
Beta blocker use contraindicated per Dr. Deirdre Priest. Contraindication documented in Quality Metric tab.

## 2015-01-14 NOTE — Progress Notes (Signed)
Patient on Quality Metric List for use of beta blocker in heart failure. Per Dr. Deirdre Priest, PCP, beta blocker is contraindicated as patient is on hospice.  Will document in quality metric tab.

## 2015-02-14 DIAGNOSIS — N2581 Secondary hyperparathyroidism of renal origin: Secondary | ICD-10-CM | POA: Diagnosis not present

## 2015-02-14 DIAGNOSIS — I129 Hypertensive chronic kidney disease with stage 1 through stage 4 chronic kidney disease, or unspecified chronic kidney disease: Secondary | ICD-10-CM | POA: Diagnosis not present

## 2015-02-14 DIAGNOSIS — N184 Chronic kidney disease, stage 4 (severe): Secondary | ICD-10-CM | POA: Diagnosis not present

## 2015-02-14 DIAGNOSIS — E785 Hyperlipidemia, unspecified: Secondary | ICD-10-CM | POA: Diagnosis not present

## 2015-03-04 ENCOUNTER — Ambulatory Visit: Payer: Medicaid Other | Admitting: Podiatry

## 2015-03-13 ENCOUNTER — Ambulatory Visit (INDEPENDENT_AMBULATORY_CARE_PROVIDER_SITE_OTHER): Payer: Medicare Other | Admitting: Podiatry

## 2015-03-13 DIAGNOSIS — M79676 Pain in unspecified toe(s): Secondary | ICD-10-CM | POA: Diagnosis not present

## 2015-03-13 DIAGNOSIS — B351 Tinea unguium: Secondary | ICD-10-CM | POA: Diagnosis not present

## 2015-03-14 NOTE — Progress Notes (Signed)
Patient ID: Frances [name], female   DOB: August 22, 1929, 80 y.o.   MRN: [id number]  Subjective: This patient presents again today complaining of painful toenails her request nail debridement  Objective: The toenails are elongated, hypertrophic, incurvated, discolored and tender to direct palpation 6-10 Plantar keratoses first MPJ bilaterally  Assessment: Symptomatic onychomycoses 6-10 Plantar keratoses 2  Plan: Nails 10 and keratoses 2 debrided without any bleeding  Reappoint 3

## 2015-04-09 ENCOUNTER — Telehealth: Payer: Self-pay | Admitting: Family Medicine

## 2015-04-09 NOTE — Telephone Encounter (Signed)
Lexa the home health nurse called about her visit today with the patient. Lying down the patients BP was 118/66  Sitting up her BP was 88/50. The patient is on lasix 20/20. jw

## 2015-04-09 NOTE — Telephone Encounter (Signed)
Will forward to PCP for review. Davione Lenker, CMA. 

## 2015-04-11 NOTE — Telephone Encounter (Signed)
Lexa called back again and would like to speak to Dr. Deirdre Priest or his nurse about the patient. The patient seems to be declining and wanted to know if she could come on the :asix. Please call Lexa at [phone]. Myriam Jacobson

## 2015-04-11 NOTE — Telephone Encounter (Signed)
Will forward to MD. Burrel Legrand,CMA  

## 2015-04-12 NOTE — Telephone Encounter (Signed)
I saw Frances [name] today at home  No current pain but does have in legs and shoulder when moves  Does not feel like eating - no abdomen pain or nausea vomiting   Trace edema in legs and few basilar crackles in lungs - No shortness of breath   Spoke with Lexa  Agree with holding lasix would use prn for swelling  Also think long acting Methadone twice daily is good idea  Asked her to call me with any ?s or problems

## 2015-04-22 ENCOUNTER — Telehealth: Payer: Self-pay | Admitting: *Deleted

## 2015-04-22 NOTE — Telephone Encounter (Signed)
Toniann Fail, RN with [facility] called for an order of transfer to [facility].  Patient was transferred to [facility] on 04/21/15 due to possible stroke.  Please call [phone].  Clovis Pu, RN

## 2015-04-23 NOTE — Telephone Encounter (Signed)
Spoke w Frances [name] about her and transfer to [facility]

## 2015-04-24 ENCOUNTER — Ambulatory Visit: Admitting: Family Medicine

## 2015-05-22 ENCOUNTER — Ambulatory Visit: Admitting: Cardiology

## 2015-05-30 DEATH — deceased

## 2015-06-25 ENCOUNTER — Ambulatory Visit: Payer: Medicare Other | Admitting: Podiatry
